# Patient Record
Sex: Female | Born: 1980 | Race: Black or African American | Hispanic: No | Marital: Married | Smoking: Never smoker
Health system: Southern US, Community
[De-identification: ages and names within clinical notes are randomized; demographics above are authoritative.]

## PROBLEM LIST (undated history)

## (undated) ENCOUNTER — Inpatient Hospital Stay (HOSPITAL_COMMUNITY): Payer: Self-pay

## (undated) DIAGNOSIS — E119 Type 2 diabetes mellitus without complications: Secondary | ICD-10-CM

## (undated) DIAGNOSIS — J302 Other seasonal allergic rhinitis: Secondary | ICD-10-CM

## (undated) HISTORY — DX: Other seasonal allergic rhinitis: J30.2

## (undated) HISTORY — DX: Type 2 diabetes mellitus without complications: E11.9

---

## 2013-12-19 ENCOUNTER — Ambulatory Visit (INDEPENDENT_AMBULATORY_CARE_PROVIDER_SITE_OTHER): Payer: Self-pay | Admitting: Emergency Medicine

## 2013-12-19 VITALS — BP 90/67 | HR 70 | Temp 98.7°F | Resp 18 | Wt 138.0 lb

## 2013-12-19 DIAGNOSIS — Z3200 Encounter for pregnancy test, result unknown: Secondary | ICD-10-CM

## 2013-12-19 DIAGNOSIS — R7309 Other abnormal glucose: Secondary | ICD-10-CM

## 2013-12-19 DIAGNOSIS — E119 Type 2 diabetes mellitus without complications: Secondary | ICD-10-CM

## 2013-12-19 LAB — POCT GLYCOSYLATED HEMOGLOBIN (HGB A1C): Hemoglobin A1C: 8.8

## 2013-12-19 LAB — POCT URINE PREGNANCY: PREG TEST UR: POSITIVE

## 2013-12-19 MED ORDER — FOLIC ACID 1 MG PO TABS: 3.0000 mg | ORAL_TABLET | Freq: Every day | ORAL | Status: DC

## 2013-12-19 MED ORDER — METFORMIN HCL ER 500 MG PO TB24: ORAL_TABLET | ORAL | Status: DC

## 2013-12-19 MED ORDER — PRENATAL VITAMINS PLUS 27-1 MG PO TABS
1.0000 | ORAL_TABLET | Freq: Every day | ORAL | Status: DC
Start: 2013-12-19 — End: 2014-01-23

## 2013-12-19 NOTE — Progress Notes (Signed)
[facility] [address], [place] [id number] [phone]  Date:  12/19/2013   Name:  Theresa Koch   DOB:  1980-02-18   MRN:  [id number]  PCP:  No primary care provider on file.    Chief Complaint: Diabetes and Possible Pregnancy   History of Present Illness:  Theresa Koch is a 33 y.o. very pleasant female patient who presents with the following:  Patient is visiting from [place] and is concerned as she has missed her last period.  She has a 721 month old daughter. She is a Type 2 diabetic currently on insulin as she lost control on metformin during her last trimester of pregnancy. Is concerned as her sugars have been elevated past several days.  She doesn't usually check her sugars and is concerned that should she be pregnant Elevated sugars may adversely affect the fetus Denies other complaint or health concern today.   There are no active problems to display for this patient.   Past Medical History  Diagnosis Date  . Diabetes mellitus without complication     Past Surgical History  Procedure Laterality Date  . Cesarean section      History  Substance Use Topics  . Smoking status: Never Smoker   . Smokeless tobacco: Not on file  . Alcohol Use: No    Family History  Problem Relation Age of Onset  . Diabetes Father   . Diabetes Sister   . Diabetes Brother     Not on File  Medication list has been reviewed and updated.  No current outpatient prescriptions on file prior to visit.   No current facility-administered medications on file prior to visit.    Review of Systems:  As per HPI, otherwise negative.    Physical Examination: Filed Vitals:   12/19/13 2004  BP: 90/67  Pulse: 70  Temp: 98.7 F (37.1 C)  Resp: 18   Filed Vitals:   12/19/13 2004  Weight: 138 lb (62.596 kg)   There is no height on file to calculate BMI. Ideal Body Weight:     GEN: WDWN, NAD, Non-toxic, Alert & Oriented x 3 HEENT: Atraumatic,  Normocephalic.  Ears and Nose: No external deformity. EXTR: No clubbing/cyanosis/edema NEURO: Normal gait.  PSYCH: Normally interactive. Conversant. Not depressed or anxious appearing.  Calm demeanor.    Assessment and Plan: NIDDM on insulin Pregnancy Metformin Folic acid  Signed,  Phillips OdorJeffery Merlinda Wrubel, MD   Results for orders placed or performed in visit on 12/19/13  POCT urine pregnancy  Result Value Ref Range   Preg Test, Ur Positive   POCT glycosylated hemoglobin (Hb A1C)  Result Value Ref Range   Hemoglobin A1C 8.8

## 2013-12-19 NOTE — Patient Instructions (Signed)
Type 1 or Type 2 Diabetes Mellitus During Pregnancy Diabetes mellitus, often simply referred to as diabetes, is a long-term (chronic) disease. Type 1 diabetes occurs when the islet cells, which are in the pancreas and make the hormone insulin, are destroyed and can no longer make insulin. Type 2 diabetes occurs when the pancreas does not make enough insulin, the cells are less responsive to the insulin that is made (insulin resistance), or both. Insulin is needed to move sugars from food into the tissue cells. The tissue cells use the sugars for energy. The lack of insulin or the lack of normal response to insulin causes excess sugars to build up in the blood instead of going into the tissue cells. As a result, high blood sugar (hyperglycemia) develops.  If blood glucose levels are kept in the normal range both before and during pregnancy, women can have a healthy pregnancy. If your blood glucose levels are not well controlled, there may be risks to you, your unborn baby, and your labor and delivery. Also, there may be risks to your baby once he or she is born.  RISK FACTORS  You are predisposed to developing type 1 diabetes if someone in your family has diabetes and you are exposed to certain environmental triggers.  You have an increased chance of developing type 2 diabetes if you have a family history of diabetes and also have one or more of the following risk factors:  Being overweight.  Having an inactive lifestyle.  Having a history of consistently eating high-calorie foods. SYMPTOMS  Increased thirst (polydipsia).  Increased urination (polyuria).  Increased urination during the night (nocturia).  Weight loss. This weight loss may be rapid.  Frequent, recurring infections.  Tiredness (fatigue).  Weakness.  Vision changes, such as blurred vision.  Fruity smell to your breath.  Abdominal pain.  Nausea or vomiting. DIAGNOSIS  Diabetes is diagnosed when blood glucose levels  are increased. Your blood glucose level may be checked by one or more of the following blood tests:  A fasting blood glucose test. You will not be allowed to eat for at least 8 hours before a blood sample is taken.  A random blood glucose test. Your blood glucose is checked at any time of the day regardless of when you ate.  A hemoglobin A1c blood glucose test. A hemoglobin A1c test provides information about blood glucose control over the previous 3 months.  An oral glucose tolerance test (OGTT). Your blood glucose is measured after you have not eaten (fasted) for 1-3 hours and then after you drink a glucose-containing beverage. An OGTT is usually performed during weeks 24-28 of your pregnancy. TREATMENT   You will need to take diabetes medicine or insulin daily to keep blood glucose levels in the desired range.  You will need to match insulin dosing with exercise and healthy food choices. The treatment goal is to maintain the before-meal (preprandial), bedtime, and overnight blood glucose level at 60-99 mg/dL during pregnancy. The treatment goal is to further maintain the peak after-meal blood sugar (postprandial glucose) level at 100-140 mg/dL.  HOME CARE INSTRUCTIONS   Have your hemoglobin A1c level checked twice a year.  Perform daily blood glucose monitoring as directed by your health care provider. It is common to perform frequent blood glucose monitoring.  Monitor urine ketones when you are sick and as directed by your health care provider.  Take your diabetes medicine and insulin as directed by your health care provider to maintain your blood glucose   level in the desired range.  Never run out of diabetes medicine or insulin. It is needed every day.  Adjust insulin based on your intake of carbohydrates. Carbohydrates can raise blood glucose levels but need to be included in your diet. Carbohydrates provide vitamins, minerals, and fiber, which are an essential part of a healthy  diet. Carbohydrates are found in fruits, vegetables, whole grains, dairy products, legumes, and foods containing added sugars.  Eat healthy foods. Alternate 3 meals with 3 snacks.  Maintain a healthy weight gain. The usual total expected weight gain varies according to your prepregnancy body mass index (BMI).  Carry a medical alert card or wear medical alert jewelry.  Carry a 15-gram carbohydrate snack with you at all times to treat low blood sugar (hypoglycemia). Some examples of 15-gram carbohydrate snacks include:  Glucose tablets, 3 or 4.  Glucose gel, 15-gram tube.  Raisins, 2 Tbsp (24 grams).  Jelly beans, 6.  Animal crackers, 8.  Fruit juice, regular soda, or low-fat milk, 4 ounces (120 mL).  Gummy treats, 9.  Recognize hypoglycemia. Hypoglycemia during pregnancy occurs with blood glucose levels of 60 mg/dL and below. The risk for hypoglycemia increases when fasting or skipping meals, during or after intense exercise, and during sleep. Hypoglycemia symptoms can include:  Tremors or shakes.  Decreased ability to concentrate.  Sweating.  Increased heart rate.  Headache.  Dry mouth.  Hunger.  Irritability.  Anxiety.  Restless sleep.  Altered speech or coordination.  Confusion.  Treat hypoglycemia promptly. If you are alert and able to safely swallow, follow the 15:15 rule:  Take 15-20 grams of rapid-acting glucose or carbohydrate. Rapid-acting options include glucose gel, glucose tablets, or 4 ounces (120 mL) of fruit juice, regular soda, or low-fat milk.  Check your blood glucose level 15 minutes after taking the glucose.  Take an additional 15-20 grams of glucose if the repeat blood glucose level is still 70 mg/dL or below.  Eat a meal or snack within 1 hour once blood glucose levels return to normal.  Engage in at least 30 minutes of physical activity a day or as directed by your health care provider. Ten minutes of physical activity timed 30  minutes after each meal is encouraged to control postprandial blood glucose levels.  Watch for polyuria (excess urination) and polydipsia (feeling extra thirsty), which are early signs of hyperglycemia. An early awareness of hyperglycemia allows for prompt treatment. Treat hyperglycemia as directed by your health care provider.  Adjust your insulin dosing and food intake, as needed, if you start a new exercise or sport.  Follow your sick-day plan any time you are unable to eat or drink as usual.  Avoid tobacco and alcohol use.  Keep all follow-up visits as directed by your health care provider.  Follow the advice of your health care provider regarding your prenatal and post-delivery (postpartum) appointments, meal planning, exercise, medicines, vitamins, blood tests, other medical tests, and physical activities.  Continue daily skin and foot care. Examine your skin and feet daily for cuts, bruises, redness, nail problems, bleeding, blisters, or sores. A foot exam by a health care provider should be done annually.  Brush your teeth and gums at least twice a day and floss at least once a day. Follow up with your dentist regularly.  Schedule an eye exam during the first trimester of your pregnancy or as directed by your health care provider.  Share your diabetes management plan with your workplace or school.  Stay up-to-date with immunizations.    Learn to manage stress.  Obtain ongoing diabetes education and support as needed.  Your health care provider may recommend that you take one low-dose aspirin (81 mg) each day to help prevent high blood pressure during your pregnancy (preeclampsia or eclampsia). You may be at risk for preeclampsia or eclampsia if:  You had preeclampsia or eclampsia during a previous pregnancy.  Your baby did not grow as expected during a previous pregnancy.  You experienced preterm birth with a previous pregnancy.  You experienced a separation of the placenta  from the uterus (placental abruption) during a previous pregnancy.  You experienced the loss of your baby during a previous pregnancy.  You are pregnant with more than one baby.  You have other medical conditions, such as high blood pressure or autoimmune disease. SEEK MEDICAL CARE IF:   You are unable to eat food or drink fluids for more than 6 hours.  You have nausea and vomiting for more than 6 hours.  You have a blood glucose level of 200 mg/dL and you have ketones in your urine.  There is a change in mental status.  You develop vision problems.  You have a persistent headache.  You have upper abdominal pain or discomfort.  You have an additional serious sickness.  You have diarrhea for more than 6 hours.  You have been sick or have had a fever for 2 days and are not getting better. SEEK IMMEDIATE MEDICAL CARE IF:  You have difficulty breathing.  You no longer feel your baby moving.  You are bleeding or have discharge from your vagina.  You start having premature contractions or labor. MAKE SURE YOU:  Understand these instructions.  Will watch your condition.  Will get help right away if you are not doing well or get worse. Document Released: 09/17/2011 Document Revised: 05/09/2013 Document Reviewed: 09/17/2011 ExitCare Patient Information 2015 [employer]. This information is not intended to replace advice given to you by your health care provider. Make sure you discuss any questions you have with your health care provider.  

## 2014-01-06 NOTE — L&D Delivery Note (Cosign Needed)
Delivery Note At 2:54 AM a viable female was delivered via Vaginal, Spontaneous Delivery (Presentation: ; Occiput Anterior).  APGAR: 9, 9; weight: pending.   Placenta status: Intact, Spontaneous.  Cord: 3 vessels     Anesthesia: Epidural  Episiotomy: None Lacerations: R labial & vaginal Suture Repair: 3.0 vicryl Est. Blood Loss (mL): 529  Mom to postpartum.  Baby to Couplet care / Skin to Skin.  Cam Hai CNM 08/09/2014, 3:59 AM

## 2014-01-12 ENCOUNTER — Encounter: Payer: Self-pay | Admitting: Internal Medicine

## 2014-01-12 ENCOUNTER — Ambulatory Visit (INDEPENDENT_AMBULATORY_CARE_PROVIDER_SITE_OTHER): Payer: 59 | Admitting: Internal Medicine

## 2014-01-12 VITALS — BP 100/62 | HR 91 | Temp 98.2°F | Resp 12 | Ht 65.0 in | Wt 135.0 lb

## 2014-01-12 DIAGNOSIS — O24911 Unspecified diabetes mellitus in pregnancy, first trimester: Secondary | ICD-10-CM

## 2014-01-12 MED ORDER — INSULIN PEN NEEDLE 32G X 4 MM MISC: Status: DC

## 2014-01-12 MED ORDER — METFORMIN HCL ER (OSM) 1000 MG PO TB24
1000.0000 mg | ORAL_TABLET | Freq: Every day | ORAL | Status: DC
Start: 2014-01-12 — End: 2014-02-14

## 2014-01-12 MED ORDER — INSULIN GLARGINE 100 UNIT/ML SOLOSTAR PEN: 8.0000 [IU] | PEN_INJECTOR | Freq: Every day | SUBCUTANEOUS | Status: DC

## 2014-01-12 MED ORDER — METFORMIN HCL ER 500 MG PO TB24: ORAL_TABLET | ORAL | Status: DC

## 2014-01-12 NOTE — Patient Instructions (Signed)
Please stop 70/30 insulin. Start Lantus 8 units at bedtime.  Please document sugars as discussed and call me in 1 week to let me know if they are controlled.  Goals of Glucose levels in pregnancy: Before meals <95 1h after a meal <140 2h after a meal <120  I referred you to ObGyn (Dr. Lynetta MareAnyawu).  Please return in 1 month with your sugar log.   PATIENT INSTRUCTIONS FOR TYPE 2 DIABETES:  **Please join MyChart!** - see attached instructions about how to join if you have not done so already.  DIET AND EXERCISE Diet and exercise is an important part of diabetic treatment.  We recommended aerobic exercise in the form of brisk walking (working between 40-60% of maximal aerobic capacity, similar to brisk walking) for 150 minutes per week (such as 30 minutes five days per week) along with 3 times per week performing 'resistance' training (using various gauge rubber tubes with handles) 5-10 exercises involving the major muscle groups (upper body, lower body and core) performing 10-15 repetitions (or near fatigue) each exercise. Start at half the above goal but build slowly to reach the above goals. If limited by weight, joint pain, or disability, we recommend daily walking in a swimming pool with water up to waist to reduce pressure from joints while allow for adequate exercise.    BLOOD GLUCOSES Monitoring your blood glucoses is important for continued management of your diabetes. Please check your blood glucoses 2-4 times a day: fasting, before meals and at bedtime (you can rotate these measurements - e.g. one day check before the 3 meals, the next day check before 2 of the meals and before bedtime, etc.).   HYPOGLYCEMIA (low blood sugar) Hypoglycemia is usually a reaction to not eating, exercising, or taking too much insulin/ other diabetes drugs.  Symptoms include tremors, sweating, hunger, confusion, headache, etc. Treat IMMEDIATELY with 15 grams of Carbs: . 4 glucose tablets .  cup regular  juice/soda . 2 tablespoons raisins . 4 teaspoons sugar . 1 tablespoon honey Recheck blood glucose in 15 mins and repeat above if still symptomatic/blood glucose <100.  RECOMMENDATIONS TO REDUCE YOUR RISK OF DIABETIC COMPLICATIONS: * Take your prescribed MEDICATION(S) * Follow a DIABETIC diet: Complex carbs, fiber rich foods, (monounsaturated and polyunsaturated) fats * AVOID saturated/trans fats, high fat foods, >2,300 mg salt per day. * EXERCISE at least 5 times a week for 30 minutes or preferably daily.  * DO NOT SMOKE OR DRINK more than 1 drink a day. * Check your FEET every day. Do not wear tightfitting shoes. Contact us if you develop an ulcer * See your EYE doctor once a year or more if needed * Get a FLU shot once a year * Get a PNEUMONIA vaccine once before and once after age 34 years  GOALS:  * Your Hemoglobin A1c of <7%  * fasting sugars need to be <130 * after meals sugars need to be <180 (2h after you start eating) * Your Systolic BP should be 140 or lower  * Your Diastolic BP should be 80 or lower  * Your HDL (Good Cholesterol) should be 40 or higher  * Your LDL (Bad Cholesterol) should be 100 or lower. * Your Triglycerides should be 150 or lower  * Your Urine microalbumin (kidney function) should be <30 * Your Body Mass Index should be 25 or lower

## 2014-01-12 NOTE — Progress Notes (Signed)
Patient ID: Theresa PerchesHoyam Finnie, female   DOB: 06-Apr-1980, 34 y.o.   MRN: [id number]  HPI: Theresa Koch is a 34 y.o.-year-old female, referred by Dr. Thornton PapasJeffrey Anderson, for management of DM2, dx in 2011, insulin-dependent since end 2013, uncontrolled, without complications. She is 2 mo pregnant. She also has a 3722 mo old daughter.  Last hemoglobin A1c was: Lab Results  Component Value Date   HGBA1C 8.8 12/19/2013   Pt is on a regimen of: - Metformin XR 1500 mg po after dinner - NPH-R 70/30 8 units in am  Pt checks her sugars 3x a day and they are: - am: 78-126 - 2h after b'fast: 46, 62 >> decreased 70/30 from 11 to 8 units >> 92, 94 - before lunch: n/c - 2h after lunch: 139-176 - before dinner: has lunch late - 2h after dinner: - bedtime: - nighttime: No lows. Lowest sugar was 46; she has hypoglycemia awareness at 60.  Highest sugar was 250, before starting Metformin.  Glucometer: One Touch Ultra  Pt's meals are: - Breakfast: tea with milk (nausea)  - Lunch: salad, soup, cheese - takes 70/30 before this - Dinner: egg, cheese, sometimes cereals - Snacks:   - no CKD, no BUN/creatinine available. - no h/o HL - last eye exam was in >6 mo. No DR.  - no numbness and tingling in her feet.  Pt has FH of DM - see below.  ROS: Constitutional: no weight gain/loss, no fatigue, no subjective hyperthermia/hypothermia, + nocturia Eyes: no blurry vision, no xerophthalmia ENT: no sore throat, no nodules palpated in throat, no dysphagia/odynophagia, no hoarseness Cardiovascular: no CP/SOB/palpitations/leg swelling Respiratory: no cough/SOB Gastrointestinal: +N/ no V/D/C Musculoskeletal: no muscle/joint aches Skin: no rashes Neurological: no tremors/numbness/tingling/dizziness, + HAs Psychiatric: no depression/anxiety  Past Medical History  Diagnosis Date  . Diabetes mellitus without complication    Past Surgical History  Procedure Laterality Date  . Cesarean section     History    Social History  . Marital Status: Married    Spouse Name: N/A    Number of Children: 1   Occupational History  . 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 Smoking status: Never Smoker   . Smokeless tobacco: Not on file  . Alcohol Use: No  . Drug Use: No   Current Outpatient Prescriptions on File Prior to Visit  Medication Sig Dispense Refill  . folic acid (FOLVITE) 1 MG tablet Take 3 tablets (3 mg total) by mouth daily. 100 tablet 12  . insulin NPH-regular Human (NOVOLIN 70/30) (70-30) 100 UNIT/ML injection Inject 15 Units into the skin.    . metFORMIN (GLUCOPHAGE XR) 500 MG 24 hr tablet 2tabs with evening meal for one week.  Then increase to 4 tabs daily 120 tablet 5  . Prenatal Vit-Fe Fumarate-FA (PRENATAL VITAMINS PLUS) 27-1 MG TABS Take 1 tablet by mouth daily. 30 tablet 12   No current facility-administered medications on file prior to visit.   No Known Allergies   Family History  Problem Relation Age of Onset  . Diabetes Father   . Diabetes Sister   . Diabetes Brother    PE: BP 100/62 mmHg  Pulse 91  Temp(Src) 98.2 F (36.8 C) (Oral)  Resp 12  Ht 5\' 5"  (1.651 m)  Wt 135 lb (61.236 kg)  BMI 22.47 kg/m2  SpO2 98%  LMP 11/06/2013 (Exact Date) Wt Readings from Last 3 Encounters:  01/12/14 135 lb (61.236 kg)  12/19/13  138 lb (62.596 kg)   Constitutional: normal weight, in NAD Eyes: PERRLA, EOMI, no exophthalmos ENT: moist mucous membranes, no thyromegaly, no cervical lymphadenopathy Cardiovascular: RRR, No MRG Respiratory: CTA B Gastrointestinal: abdomen soft, NT, ND, BS+ Musculoskeletal: no deformities, strength intact in all 4 Skin: moist, warm, no rashes Neurological: no tremor with outstretched hands, DTR normal in all 4  ASSESSMENT: 1. DM2, insulin-dependent, uncontrolled, without complications, during first trimester  PLAN:  1. Patient with several years h/o diabetes, now pregnant - We discussed about her  diabetes - appears to have DM2. We also discussed about options for treatment, and I suggested to:  Patient Instructions  Please stop 70/30 insulin. Start Lantus 8 units at bedtime.  Please document sugars as discussed and call me in 1 week to let me know if they are controlled.  Goals of Glucose levels in pregnancy: Before meals <95 1h after a meal <140 2h after a meal <120  I referred you to ObGyn (Dr. Lynetta Mare).  Please return in 1 month with your sugar log.  - discussed goals of CBGs in pregnancy: Before meals <95, 1h after a meal <140 2h after a meal <120 - discussed - Strongly advised her to start checking sugars at different times of the day - check 6 times a day, rotating checks - given sugar log and advised how to fill it and to bring it at next appt  - she was also advised to call me in ~ 1 week to see if we need mealtime insulin or Glyburide.to cover her postprandial sugars - we discussed about possible complications of hyperglycemia in pregnancy depending on the trimester - she is worried that her HbA1c was high: 8.8% - given foot care handout and explained the principles  - given instructions for hypoglycemia management "15-15 rule"  - advised for yearly eye exams  - referred [facility] to [facility] as she is not established with them yet - at next visit, will need to check a TSH, CMP, Lipids, ACR, if not seen ObGyn by then - Return to clinic in 1 mo with sugar log   - time spent with the patient: 1 hour, of which >50% was spent in obtaining information about her diabetes, reviewing her previous labs, evaluations, and treatments, counseling her about her condition (please see the discussed topics above), and developing a plan to further treat it. She had a number of questions which I addressed.

## 2014-01-16 ENCOUNTER — Telehealth: Payer: Self-pay | Admitting: Internal Medicine

## 2014-01-18 ENCOUNTER — Telehealth: Payer: Self-pay | Admitting: *Deleted

## 2014-01-18 DIAGNOSIS — O24911 Unspecified diabetes mellitus in pregnancy, first trimester: Secondary | ICD-10-CM

## 2014-01-18 NOTE — Telephone Encounter (Addendum)
[facility].  She is reportedly 2 months pregnant, has pre-existing diabetes and is in need of prenatal care. She has recently arrived from [place]. I scheduled US appt for 1/15 @ 1330. Pt will also receive clinic appt for 1/18 to begin prenatal care and meet with Diabetes Educator as her current prescribed insulin regimen is not standard of care for pregnancy.  I called pt and left message to call us back regarding appts scheduled. It is imperative that she has US this week and is seen by MD on 1/18. 0930  Pt's husband called back and pt was there with him while we spoke. I informed him of US appt on 1/15 and that a clinic appt would be made for 1/18. He informed Gracelyn and they both voiced agreement and understanding. They will be contacted with her clinic appt information.  He stated that a message can be left on the voice mail.

## 2014-01-19 NOTE — Telephone Encounter (Signed)
error 

## 2014-01-20 ENCOUNTER — Encounter: Payer: Self-pay | Admitting: Internal Medicine

## 2014-01-20 ENCOUNTER — Ambulatory Visit (HOSPITAL_COMMUNITY)
Admission: RE | Admit: 2014-01-20 | Discharge: 2014-01-20 | Disposition: A | Discharge status: Home / Self Care | Payer: 59 | Source: Ambulatory Visit | Attending: Obstetrics and Gynecology | Admitting: Obstetrics and Gynecology

## 2014-01-20 DIAGNOSIS — O24911 Unspecified diabetes mellitus in pregnancy, first trimester: Secondary | ICD-10-CM

## 2014-01-20 DIAGNOSIS — Z3A11 11 weeks gestation of pregnancy: Secondary | ICD-10-CM | POA: Insufficient documentation

## 2014-01-20 DIAGNOSIS — O26841 Uterine size-date discrepancy, first trimester: Secondary | ICD-10-CM | POA: Insufficient documentation

## 2014-01-23 ENCOUNTER — Ambulatory Visit (INDEPENDENT_AMBULATORY_CARE_PROVIDER_SITE_OTHER): Payer: 59 | Admitting: Obstetrics & Gynecology

## 2014-01-23 ENCOUNTER — Encounter: Payer: 59 | Attending: Obstetrics & Gynecology | Admitting: *Deleted

## 2014-01-23 ENCOUNTER — Other Ambulatory Visit: Payer: Self-pay | Admitting: *Deleted

## 2014-01-23 ENCOUNTER — Encounter: Payer: Self-pay | Admitting: Obstetrics & Gynecology

## 2014-01-23 VITALS — BP 102/63 | HR 79 | Temp 98.3°F | Ht 64.75 in | Wt 132.1 lb

## 2014-01-23 DIAGNOSIS — O24911 Unspecified diabetes mellitus in pregnancy, first trimester: Secondary | ICD-10-CM

## 2014-01-23 DIAGNOSIS — O34219 Maternal care for unspecified type scar from previous cesarean delivery: Secondary | ICD-10-CM | POA: Insufficient documentation

## 2014-01-23 DIAGNOSIS — Z23 Encounter for immunization: Secondary | ICD-10-CM

## 2014-01-23 DIAGNOSIS — Z713 Dietary counseling and surveillance: Secondary | ICD-10-CM | POA: Diagnosis not present

## 2014-01-23 DIAGNOSIS — O3421 Maternal care for scar from previous cesarean delivery: Secondary | ICD-10-CM

## 2014-01-23 LAB — COMPREHENSIVE METABOLIC PANEL
ALT: 11 U/L (ref 0–35)
AST: 16 U/L (ref 0–37)
Albumin: 4.1 g/dL (ref 3.5–5.2)
Alkaline Phosphatase: 34 U/L — ABNORMAL LOW (ref 39–117)
BUN: 8 mg/dL (ref 6–23)
CALCIUM: 9.3 mg/dL (ref 8.4–10.5)
CHLORIDE: 97 meq/L (ref 96–112)
CO2: 25 mEq/L (ref 19–32)
Creat: 0.44 mg/dL — ABNORMAL LOW (ref 0.50–1.10)
Glucose, Bld: 75 mg/dL (ref 70–99)
Potassium: 4.1 mEq/L (ref 3.5–5.3)
SODIUM: 132 meq/L — AB (ref 135–145)
Total Bilirubin: 0.3 mg/dL (ref 0.2–1.2)
Total Protein: 7.3 g/dL (ref 6.0–8.3)

## 2014-01-23 LAB — POCT URINALYSIS DIP (DEVICE)
Bilirubin Urine: NEGATIVE
GLUCOSE, UA: 100 mg/dL — AB
Hgb urine dipstick: NEGATIVE
KETONES UR: NEGATIVE mg/dL
Leukocytes, UA: NEGATIVE
Nitrite: NEGATIVE
Protein, ur: NEGATIVE mg/dL
Specific Gravity, Urine: 1.025 (ref 1.005–1.030)
UROBILINOGEN UA: 0.2 mg/dL (ref 0.0–1.0)
pH: 5 (ref 5.0–8.0)

## 2014-01-23 LAB — TSH: TSH: 1.056 u[IU]/mL (ref 0.350–4.500)

## 2014-01-23 MED ORDER — GLUCOSE BLOOD VI STRP: ORAL_STRIP | Status: DC

## 2014-01-23 MED ORDER — FOLIC ACID 1 MG PO TABS: 1.0000 mg | ORAL_TABLET | Freq: Every day | ORAL | Status: DC

## 2014-01-23 MED ORDER — PRENATAL VITAMINS PLUS 27-1 MG PO TABS: 1.0000 | ORAL_TABLET | Freq: Every day | ORAL | Status: DC

## 2014-01-23 MED ORDER — ONETOUCH DELICA LANCETS FINE MISC: Status: DC

## 2014-01-23 MED ORDER — ASPIRIN EC 81 MG PO TBEC: 81.0000 mg | DELAYED_RELEASE_TABLET | Freq: Every day | ORAL | Status: DC

## 2014-01-23 NOTE — Progress Notes (Signed)
MFM appt for NT scheduled for 1/27 Will call tomorrow or Wednesday for Fetal echo & opthalmology appts as those offices are closed

## 2014-01-23 NOTE — Progress Notes (Signed)
Here for first prenatal visit. Given new patient information. Discussed bmi/ appropriate weight gain.

## 2014-01-23 NOTE — Progress Notes (Signed)
Subjective:    Theresa Koch is a 34 y.o. G2P1001 at 7681w1d  By LMP consistent with 11 week ultrasound being seen today for her first obstetrical visit.  Her obstetrical history is significant for previous cesarean section because "insulin-dependent diabetes" as she was told by her MD in [place].  She has been seen by Dr. Elvera LennoxGherghe this pregnancy who placed her on Lantus and Metformin. The Lantus 8 units qhs causes her to have BS of 40-50s overnight associated with dizziness and shaking.  No other concerning symptoms;  patient reports nausea but able to tolerate food. Patient does intend to breast feed. Pregnancy history fully reviewed.    Filed Vitals:   01/23/14 0906 01/23/14 0910  BP: 102/63   Pulse: 79   Temp: 98.3 F (36.8 C)   Height:  5' 4.75" (1.645 m)  Weight: 132 lb 1.6 oz (59.92 kg)     HISTORY: OB History  Gravida Para Term Preterm AB SAB TAB Ectopic Multiple Living  2 1 1       1     # Outcome Date GA Lbr Len/2nd Weight Sex Delivery Anes PTL Lv  2 Current           1 Term 02/28/12 179w0d  5 lb 15.2 oz (2.7 kg) F  EPI N Y    Obstetric Comments  Had diabetes during pregnancy. Planned c/s at 38 weeks due to diabetes. Born in [place].   Past Medical History  Diagnosis Date  . Diabetes mellitus without complication    Past Surgical History  Procedure Laterality Date  . Cesarean section     Family History  Problem Relation Age of Onset  . Diabetes Father   . Kidney disease Father   . Diabetes Sister   . Diabetes Brother      Exam    Uterus:     Pelvic Exam:    Perineum: No Hemorrhoids, Normal Perineum   Vulva: normal   Vagina:  normal mucosa, normal discharge   Cervix: no cervical motion tenderness, no lesions and nulliparous appearance   Adnexa: normal adnexa and no mass, fullness, tenderness   Bony Pelvis: average  System: Breast:  normal appearance, no masses or tenderness   Skin: normal coloration and turgor, no rashes   Neurologic: oriented, normal   Extremities: normal strength, tone, and muscle mass, no deformities   HEENT PERRLA and extra ocular movement intact   Mouth/Teeth mucous membranes moist, pharynx normal without lesions and dental hygiene good   Neck supple and no masses   Cardiovascular: regular rate and rhythm   Respiratory:  appears well, vitals normal, no respiratory distress, acyanotic, normal RR, chest clear, no wheezing, crepitations, rhonchi, normal symmetric air entry   Abdomen: soft, non-tender; bowel sounds normal; no masses,  no organomegaly   Urinary: urethral meatus normal      Assessment:    Pregnancy: G2P1001 Patient Active Problem List   Diagnosis Date Noted  . Diabetes mellitus during pregnancy in first trimester, antepartum 01/12/2014    Plan:    DM Checkbox [x]  Baseline labs (CBC, CMP, urine Pr:Cr,TSH, HgA1C) [x]  Baby ASA prescribed after 12 weeks [ ]  Fetal ECHO - scheduled [ ]  Optho exam - scheduled [ ]  Serial growth scans 20-24-28-32-35-38 [ ]  Antenatal testing starting at 32 weeks [ ]  Delivery by 39 weeks or earlier if needed Discussed implications of DM in pregnancy, need for optimizing glycemic control to decrease DM associated maternal-fetal morbidity and mortality, need for antenatal testing and  frequent ultrasounds/prenatal visits. Will check baseline labs today, get DM education and DM testing supplies today. Initial prenatal labs drawn. Continue Prenatal vitamins and Aspirin. Flu shot given today. Problem list reviewed and updated. Genetic Screening discussed First Screen: ordered. Ultrasound discussed; fetal survey: to be ordered later. TOLAC form given to patient to review at home Follow up in 1 week.   Routine obstetric precautions reviewed.   Tereso Newcomer, MD 01/23/2014

## 2014-01-23 NOTE — Progress Notes (Signed)
Patient presents for review of glucose readings. Theresa Koch is T2DM and a patient of Dr.  Elvera LennoxGherghe. She was initially switch to Lantus 8units nightly. Due to FBS lows she decreased the Lantus last evening to 7units. However she did not test her FBS this AM so I am unable to evaluate the affects of the dose decrease. Patient understands the importance of testing and will monitor 6Xdaily. She will continue to communicate her readings to Dr. Elvera LennoxGherghe We reviewed dietary needs of patient with T2DM complicated by pregnancy. Encouraged 30 grams of carbs for breakfast, 45g for lunch and dinner. Three snacks per day at 15-20grams. I emphasized the need for bedtime snack. Theresa Koch is experiencing nausea and lack of appetite. She is also fearful of eating any foods containing sugar. Further education was provided about the need for carbohydrates. Food/meal examples were provided. I will f/u with patient as needed.

## 2014-01-23 NOTE — Patient Instructions (Signed)
Return to clinic for any obstetric concerns or go to [facility] for evaluation  

## 2014-01-24 LAB — OBSTETRIC PANEL
ANTIBODY SCREEN: NEGATIVE
BASOS PCT: 0 % (ref 0–1)
Basophils Absolute: 0 10*3/uL (ref 0.0–0.1)
Eosinophils Absolute: 0.1 10*3/uL (ref 0.0–0.7)
Eosinophils Relative: 2 % (ref 0–5)
HEMATOCRIT: 38.1 % (ref 36.0–46.0)
Hemoglobin: 12.8 g/dL (ref 12.0–15.0)
Hepatitis B Surface Ag: NEGATIVE
Lymphocytes Relative: 31 % (ref 12–46)
Lymphs Abs: 1.3 10*3/uL (ref 0.7–4.0)
MCH: 27.6 pg (ref 26.0–34.0)
MCHC: 33.6 g/dL (ref 30.0–36.0)
MCV: 82.1 fL (ref 78.0–100.0)
MONOS PCT: 8 % (ref 3–12)
MPV: 11.1 fL (ref 8.6–12.4)
Monocytes Absolute: 0.3 10*3/uL (ref 0.1–1.0)
Neutro Abs: 2.5 10*3/uL (ref 1.7–7.7)
Neutrophils Relative %: 59 % (ref 43–77)
Platelets: 174 10*3/uL (ref 150–400)
RBC: 4.64 MIL/uL (ref 3.87–5.11)
RDW: 15.6 % — AB (ref 11.5–15.5)
Rh Type: POSITIVE
Rubella: 9.84 Index — ABNORMAL HIGH (ref <0.90)
WBC: 4.2 10*3/uL (ref 4.0–10.5)

## 2014-01-24 LAB — PROTEIN / CREATININE RATIO, URINE
CREATININE, URINE: 92.8 mg/dL
Protein Creatinine Ratio: 0.11 (ref <0.15)
Total Protein, Urine: 10 mg/dL (ref 5–24)

## 2014-01-24 LAB — CULTURE, OB URINE
Colony Count: NO GROWTH
Organism ID, Bacteria: NO GROWTH

## 2014-01-25 ENCOUNTER — Telehealth: Payer: Self-pay | Admitting: General Practice

## 2014-01-25 LAB — HEMOGLOBINOPATHY EVALUATION
HGB A2 QUANT: 3 % (ref 2.2–3.2)
HGB S QUANTITAION: 0 %
Hemoglobin Other: 0 %
Hgb A: 97 % (ref 96.8–97.8)
Hgb F Quant: 0 % (ref 0.0–2.0)

## 2014-01-25 NOTE — Telephone Encounter (Signed)
[facility] appt [facility] Feb 15 @ 945, phone number [phone] address [address]. Made fetal echo appt with dr Elizebeth [facility]okingotton at [facility] 2/22 @ 9am phone [phone], address [address]. Called patient to inform of appts, no answer- left message we are calling to reach you in regards to some appts we made for you, please call us back at the clinics.

## 2014-01-26 ENCOUNTER — Other Ambulatory Visit: Payer: Self-pay | Admitting: *Deleted

## 2014-01-26 LAB — CYTOLOGY - PAP

## 2014-01-26 LAB — PRESCRIPTION MONITORING PROFILE (19 PANEL)
Amphetamine/Meth: NEGATIVE ng/mL
BUPRENORPHINE, URINE: NEGATIVE ng/mL
Barbiturate Screen, Urine: NEGATIVE ng/mL
Benzodiazepine Screen, Urine: NEGATIVE ng/mL
CREATININE, URINE: 92.91 mg/dL (ref >20.0)
Cannabinoid Scrn, Ur: NEGATIVE ng/mL
Carisoprodol, Urine: NEGATIVE ng/mL
Cocaine Metabolites: NEGATIVE ng/mL
ECSTASY: NEGATIVE ng/mL
Fentanyl, Ur: NEGATIVE ng/mL
MEPERIDINE UR: NEGATIVE ng/mL
Methadone Screen, Urine: NEGATIVE ng/mL
Methaqualone: NEGATIVE ng/mL
Nitrites, Initial: NEGATIVE ug/mL
OXYCODONE SCRN UR: NEGATIVE ng/mL
Opiate Screen, Urine: NEGATIVE ng/mL
Phencyclidine, Ur: NEGATIVE ng/mL
Propoxyphene: NEGATIVE ng/mL
Tapentadol, urine: NEGATIVE ng/mL
Tramadol Scrn, Ur: NEGATIVE ng/mL
Zolpidem, Urine: NEGATIVE ng/mL
pH, Initial: 5.3 pH (ref 4.5–8.9)

## 2014-01-26 MED ORDER — GLUCOSE BLOOD VI STRP: ORAL_STRIP | Status: DC

## 2014-01-26 NOTE — Telephone Encounter (Signed)
Note made on Mondays visit to inform patient of her appointment.

## 2014-01-30 ENCOUNTER — Ambulatory Visit (INDEPENDENT_AMBULATORY_CARE_PROVIDER_SITE_OTHER): Payer: 59 | Admitting: Obstetrics and Gynecology

## 2014-01-30 ENCOUNTER — Ambulatory Visit: Payer: 59

## 2014-01-30 VITALS — BP 109/69 | HR 82 | Temp 98.0°F | Wt 133.7 lb

## 2014-01-30 DIAGNOSIS — O24911 Unspecified diabetes mellitus in pregnancy, first trimester: Secondary | ICD-10-CM

## 2014-01-30 LAB — POCT URINALYSIS DIP (DEVICE)
Bilirubin Urine: NEGATIVE
GLUCOSE, UA: 500 mg/dL — AB
Hgb urine dipstick: NEGATIVE
Ketones, ur: NEGATIVE mg/dL
Leukocytes, UA: NEGATIVE
Nitrite: NEGATIVE
Protein, ur: NEGATIVE mg/dL
Specific Gravity, Urine: >=1.03 (ref 1.005–1.030)
Urobilinogen, UA: 0.2 mg/dL (ref 0.0–1.0)
pH: 6 (ref 5.0–8.0)

## 2014-01-30 NOTE — Progress Notes (Signed)
Pt with h/o diabetes, pt of Dr. Lafe GarinGherge.   #) Pre-existing DM -  - Echo and optho exam are scheduled  - Still taking metformin 500 mg TID - Taking 7 u of lantus qPM now given hypoglycemia with 8 u lantus Fastings: 54 - 66. No longer with feelings of hypoglycemia 2 hr pp:  - Bfast - 105, 77, 126, 150, 84, 72 - Lunch - 76, 126, 100, 127, 164 - Dinner - 185, 114, 167 7 of 14 > 120  Will have patient try 7 u lantus in AM with breakfast.   No VB, No LOF, no cramping, no contractions. + fm?   RTC 1 wk for re-eval sugars.

## 2014-01-30 NOTE — Progress Notes (Signed)
Informed patient of appts

## 2014-02-01 ENCOUNTER — Encounter (HOSPITAL_COMMUNITY): Payer: Self-pay

## 2014-02-01 ENCOUNTER — Ambulatory Visit (HOSPITAL_COMMUNITY)
Admission: RE | Admit: 2014-02-01 | Discharge: 2014-02-01 | Disposition: A | Discharge status: Home / Self Care | Payer: 59 | Source: Ambulatory Visit | Attending: Obstetrics & Gynecology | Admitting: Obstetrics & Gynecology

## 2014-02-01 DIAGNOSIS — Z794 Long term (current) use of insulin: Secondary | ICD-10-CM | POA: Insufficient documentation

## 2014-02-01 DIAGNOSIS — E119 Type 2 diabetes mellitus without complications: Secondary | ICD-10-CM | POA: Diagnosis not present

## 2014-02-01 DIAGNOSIS — O3421 Maternal care for scar from previous cesarean delivery: Secondary | ICD-10-CM | POA: Insufficient documentation

## 2014-02-01 DIAGNOSIS — O24111 Pre-existing diabetes mellitus, type 2, in pregnancy, first trimester: Secondary | ICD-10-CM | POA: Insufficient documentation

## 2014-02-01 DIAGNOSIS — Z3A12 12 weeks gestation of pregnancy: Secondary | ICD-10-CM | POA: Diagnosis not present

## 2014-02-01 DIAGNOSIS — Z36 Encounter for antenatal screening of mother: Secondary | ICD-10-CM | POA: Insufficient documentation

## 2014-02-01 DIAGNOSIS — Z369 Encounter for antenatal screening, unspecified: Secondary | ICD-10-CM | POA: Insufficient documentation

## 2014-02-01 DIAGNOSIS — O24911 Unspecified diabetes mellitus in pregnancy, first trimester: Secondary | ICD-10-CM

## 2014-02-01 LAB — SCREEN, FIRST TRIMESTER, SERUM

## 2014-02-02 ENCOUNTER — Encounter: Payer: Self-pay | Admitting: Internal Medicine

## 2014-02-06 ENCOUNTER — Ambulatory Visit (INDEPENDENT_AMBULATORY_CARE_PROVIDER_SITE_OTHER): Payer: 59 | Admitting: Family Medicine

## 2014-02-06 VITALS — BP 102/63 | HR 72 | Temp 98.2°F | Wt 131.7 lb

## 2014-02-06 DIAGNOSIS — O24911 Unspecified diabetes mellitus in pregnancy, first trimester: Secondary | ICD-10-CM

## 2014-02-06 DIAGNOSIS — O3421 Maternal care for scar from previous cesarean delivery: Secondary | ICD-10-CM

## 2014-02-06 DIAGNOSIS — O34219 Maternal care for unspecified type scar from previous cesarean delivery: Secondary | ICD-10-CM

## 2014-02-06 LAB — POCT URINALYSIS DIP (DEVICE)
Bilirubin Urine: NEGATIVE
Glucose, UA: NEGATIVE mg/dL
HGB URINE DIPSTICK: NEGATIVE
Ketones, ur: NEGATIVE mg/dL
Leukocytes, UA: NEGATIVE
NITRITE: NEGATIVE
PH: 7.5 (ref 5.0–8.0)
Protein, ur: NEGATIVE mg/dL
SPECIFIC GRAVITY, URINE: 1.02 (ref 1.005–1.030)
Urobilinogen, UA: 0.2 mg/dL (ref 0.0–1.0)

## 2014-02-06 NOTE — Progress Notes (Signed)
Fasting blood sugar has been low with some symptoms of hypoglycemia.  Taking 5 units of lantus at night.  Continue metformin.  3-4 elevated PP cbg with change (130s).  Changing diet - reducing carbohydrates.  Dr Lafe GarinGherge managing.  Has appt with him next week

## 2014-02-06 NOTE — Patient Instructions (Signed)
Second Trimester of Pregnancy The second trimester is from week 13 through week 28, months 4 through 6. The second trimester is often a time when you feel your best. Your body has also adjusted to being pregnant, and you begin to feel better physically. Usually, morning sickness has lessened or quit completely, you may have more energy, and you may have an increase in appetite. The second trimester is also a time when the fetus is growing rapidly. At the end of the sixth month, the fetus is about 9 inches long and weighs about 1 pounds. You will likely begin to feel the baby move (quickening) between 18 and 20 weeks of the pregnancy. BODY CHANGES Your body goes through many changes during pregnancy. The changes vary from woman to woman.   Your weight will continue to increase. You will notice your lower abdomen bulging out.  You may begin to get stretch marks on your hips, abdomen, and breasts.  You may develop headaches that can be relieved by medicines approved by your health care provider.  You may urinate more often because the fetus is pressing on your bladder.  You may develop or continue to have heartburn as a result of your pregnancy.  You may develop constipation because certain hormones are causing the muscles that push waste through your intestines to slow down.  You may develop hemorrhoids or swollen, bulging veins (varicose veins).  You may have back pain because of the weight gain and pregnancy hormones relaxing your joints between the bones in your pelvis and as a result of a shift in weight and the muscles that support your balance.  Your breasts will continue to grow and be tender.  Your gums may bleed and may be sensitive to brushing and flossing.  Dark spots or blotches (chloasma, mask of pregnancy) may develop on your face. This will likely fade after the baby is born.  A dark line from your belly button to the pubic area (linea nigra) may appear. This will likely fade  after the baby is born.  You may have changes in your hair. These can include thickening of your hair, rapid growth, and changes in texture. Some women also have hair loss during or after pregnancy, or hair that feels dry or thin. Your hair will most likely return to normal after your baby is born. WHAT TO EXPECT AT YOUR PRENATAL VISITS During a routine prenatal visit:  You will be weighed to make sure you and the fetus are growing normally.  Your blood pressure will be taken.  Your abdomen will be measured to track your baby's growth.  The fetal heartbeat will be listened to.  Any test results from the previous visit will be discussed. Your health care provider may ask you:  How you are feeling.  If you are feeling the baby move.  If you have had any abnormal symptoms, such as leaking fluid, bleeding, severe headaches, or abdominal cramping.  If you have any questions. Other tests that may be performed during your second trimester include:  Blood tests that check for:  Low iron levels (anemia).  Gestational diabetes (between 24 and 28 weeks).  Rh antibodies.  Urine tests to check for infections, diabetes, or protein in the urine.  An ultrasound to confirm the proper growth and development of the baby.  An amniocentesis to check for possible genetic problems.  Fetal screens for spina bifida and Down syndrome. HOME CARE INSTRUCTIONS   Avoid all smoking, herbs, alcohol, and unprescribed   drugs. These chemicals affect the formation and growth of the baby.  Follow your health care provider's instructions regarding medicine use. There are medicines that are either safe or unsafe to take during pregnancy.  Exercise only as directed by your health care provider. Experiencing uterine cramps is a good sign to stop exercising.  Continue to eat regular, healthy meals.  Wear a good support bra for breast tenderness.  Do not use hot tubs, steam rooms, or saunas.  Wear your  seat belt at all times when driving.  Avoid raw meat, uncooked cheese, cat litter boxes, and soil used by cats. These carry germs that can cause birth defects in the baby.  Take your prenatal vitamins.  Try taking a stool softener (if your health care provider approves) if you develop constipation. Eat more high-fiber foods, such as fresh vegetables or fruit and whole grains. Drink plenty of fluids to keep your urine clear or pale yellow.  Take warm sitz baths to soothe any pain or discomfort caused by hemorrhoids. Use hemorrhoid cream if your health care provider approves.  If you develop varicose veins, wear support hose. Elevate your feet for 15 minutes, 3-4 times a day. Limit salt in your diet.  Avoid heavy lifting, wear low heel shoes, and practice good posture.  Rest with your legs elevated if you have leg cramps or low back pain.  Visit your dentist if you have not gone yet during your pregnancy. Use a soft toothbrush to brush your teeth and be gentle when you floss.  A sexual relationship may be continued unless your health care provider directs you otherwise.  Continue to go to all your prenatal visits as directed by your health care provider. SEEK MEDICAL CARE IF:   You have dizziness.  You have mild pelvic cramps, pelvic pressure, or nagging pain in the abdominal area.  You have persistent nausea, vomiting, or diarrhea.  You have a bad smelling vaginal discharge.  You have pain with urination. SEEK IMMEDIATE MEDICAL CARE IF:   You have a fever.  You are leaking fluid from your vagina.  You have spotting or bleeding from your vagina.  You have severe abdominal cramping or pain.  You have rapid weight gain or loss.  You have shortness of breath with chest pain.  You notice sudden or extreme swelling of your face, hands, ankles, feet, or legs.  You have not felt your baby move in over an hour.  You have severe headaches that do not go away with  medicine.  You have vision changes. Document Released: 12/17/2000 Document Revised: 12/28/2012 Document Reviewed: 02/24/2012 ExitCare Patient Information 2015 [employer]. This information is not intended to replace advice given to you by your health care provider. Make sure you discuss any questions you have with your health care provider.  

## 2014-02-07 ENCOUNTER — Other Ambulatory Visit (HOSPITAL_COMMUNITY): Payer: Self-pay

## 2014-02-09 ENCOUNTER — Other Ambulatory Visit: Payer: Self-pay | Admitting: Obstetrics & Gynecology

## 2014-02-09 DIAGNOSIS — Z3689 Encounter for other specified antenatal screening: Secondary | ICD-10-CM

## 2014-02-09 DIAGNOSIS — O34219 Maternal care for unspecified type scar from previous cesarean delivery: Secondary | ICD-10-CM

## 2014-02-09 DIAGNOSIS — O24319 Unspecified pre-existing diabetes mellitus in pregnancy, unspecified trimester: Secondary | ICD-10-CM

## 2014-02-14 ENCOUNTER — Encounter: Payer: Self-pay | Admitting: Internal Medicine

## 2014-02-14 ENCOUNTER — Ambulatory Visit (INDEPENDENT_AMBULATORY_CARE_PROVIDER_SITE_OTHER): Payer: 59 | Admitting: Internal Medicine

## 2014-02-14 VITALS — BP 100/66 | HR 78 | Temp 98.3°F | Resp 12 | Wt 131.8 lb

## 2014-02-14 DIAGNOSIS — O24911 Unspecified diabetes mellitus in pregnancy, first trimester: Secondary | ICD-10-CM

## 2014-02-14 LAB — HEMOGLOBIN A1C: HEMOGLOBIN A1C: 7.6 % — AB (ref 4.6–6.5)

## 2014-02-14 MED ORDER — GLIPIZIDE ER 2.5 MG PO TB24: 2.5000 mg | ORAL_TABLET | Freq: Every day | ORAL | Status: DC

## 2014-02-14 MED ORDER — INSULIN GLARGINE 100 UNIT/ML SOLOSTAR PEN: 4.0000 [IU] | PEN_INJECTOR | Freq: Every day | SUBCUTANEOUS | Status: DC

## 2014-02-14 NOTE — Patient Instructions (Addendum)
Please return in 1 month with your sugar log.   Please stop at the lab.  Please continue: - Metformin XR 500 mg po after each meal  Decrease: - Lantus to 4 units at bedtime. Stop in 2-4 days after Glipizide if sugars consistently <70.  Start: - Glipizide XL 2.5 mg before breakfast. Please increase to 5 mg if sugars after meals not <120 and if you have no lows.

## 2014-02-14 NOTE — Progress Notes (Signed)
Patient ID: Theresa PerchesHoyam Herzberg, female   DOB: May 29, 1980, 34 y.o.   MRN: [id number]  HPI: Theresa Koch is a 34 y.o.-year-old female, referred by Dr. Thornton PapasJeffrey Anderson, for management of DM2, dx in 2011, insulin-dependent since end 2013, uncontrolled, without complications. She is 3 mo pregnant. She also has a 34 y/o daughter.  Last hemoglobin A1c was: Lab Results  Component Value Date   HGBA1C 8.8 12/19/2013   Pt was on a regimen of: - Metformin XR 1500 mg po after dinner - NPH-R 70/30 8 units in am  She is now on: - Lantus 6 units at bedtime - Metformin XR 500 mg po after each meal  Pt checks her sugars 3x a day and they are: - am: 78-126 >> 58-80 - 2h after b'fast: 46, 62 >> decreased 70/30 from 11 to 8 units >> 92, 94 >> 96-164, 184 - before lunch: n/c >> 53-116 - 2h after lunch: 139-176 >> 97-169, 221 - before dinner: has lunch late >> 72-89 - 2h after dinner: 134-179 - bedtime: n/c - nighttime: 56, 70-90 No lows. Lowest sugar was 46 >> 53; she has hypoglycemia awareness at 60.  Highest sugar was 221.  Glucometer: One Touch Ultra  Pt's meals are: - Breakfast: tea with milk (nausea)  - Lunch: salad, soup, cheese - takes 70/30 before this - Dinner: egg, cheese, sometimes cereals - Snacks:   - no CKD, no BUN/creatinine available. - no h/o HL - last eye exam was in >6 mo. No DR.  - no numbness and tingling in her feet.   ROS: Constitutional: no weight gain/loss, no fatigue, no subjective hyperthermia/hypothermia, + nocturia Eyes: no blurry vision, no xerophthalmia ENT: no sore throat, no nodules palpated in throat, no dysphagia/odynophagia, no hoarseness Cardiovascular: no CP/SOB/palpitations/leg swelling Respiratory: no cough/SOB Gastrointestinal: + N/ no V/D/C Musculoskeletal: no muscle/joint aches Skin: no rashes Neurological: no tremors/numbness/tingling/dizziness, + HAs  I reviewed pt's medications, allergies, PMH, social hx, family hx, and changes were documented in  the history of present illness. Otherwise, unchanged from my initial visit note:  Past Medical History  Diagnosis Date  . Diabetes mellitus without complication    Past Surgical History  Procedure Laterality Date  . Cesarean section     History   Social History  . Marital Status: Married    Spouse Name: N/A    Number of Children: 1   Occupational History  . 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 Smoking status: Never Smoker   . Smokeless tobacco: Not on file  . Alcohol Use: No  . Drug Use: No   Current Outpatient Prescriptions on File Prior to Visit  Medication Sig Dispense Refill  . folic acid (FOLVITE) 1 MG tablet Take 1 tablet (1 mg total) by mouth daily. 100 tablet 12  . glucose blood (ONE TOUCH ULTRA TEST) test strip Use to test blood sugar 6 times daily as instructed. 200 each 4  . Insulin Glargine (LANTUS SOLOSTAR) 100 UNIT/ML Solostar Pen Inject 8 Units into the skin daily at 10 pm. (Patient taking differently: Inject 6 Units into the skin daily at 10 pm. ) 5 pen 2  . Insulin Pen Needle (CAREFINE PEN NEEDLES) 32G X 4 MM MISC Use 1x a day 50 each 2  . metformin (FORTAMET) 1000 MG (OSM) 24 hr tablet Take 1 tablet (1,000 mg total) by mouth daily with breakfast. (Patient taking differently: Take 1,500 mg by mouth daily with breakfast. )  90 tablet 2  . ONETOUCH DELICA LANCETS FINE MISC Use to test blood sugar 6 times daily as instructed. 200 each 3  . Prenatal Vit-Fe Fumarate-FA (PRENATAL VITAMINS PLUS) 27-1 MG TABS Take 1 tablet by mouth daily. 30 tablet 12  . aspirin EC 81 MG tablet Take 1 tablet (81 mg total) by mouth daily. Take after 12 weeks for prevention of preeclampssia later in pregnancy (Patient not taking: Reported on 02/14/2014) 300 tablet 2   No current facility-administered medications on file prior to visit.   No Known Allergies   Family History  Problem Relation Age of Onset  . Diabetes Father   . Kidney disease  Father   . Diabetes Sister   . Diabetes Brother    PE: BP 100/66 mmHg  Pulse 78  Temp(Src) 98.3 F (36.8 C) (Oral)  Resp 12  Wt 131 lb 12.8 oz (59.784 kg)  SpO2 97%  LMP 11/06/2013 (Exact Date) Wt Readings from Last 3 Encounters:  02/14/14 131 lb 12.8 oz (59.784 kg)  02/06/14 131 lb 11.2 oz (59.739 kg)  01/30/14 133 lb 11.2 oz (60.646 kg)   Constitutional: normal weight, in NAD Eyes: PERRLA, EOMI, no exophthalmos ENT: moist mucous membranes, no thyromegaly, no cervical lymphadenopathy Cardiovascular: RRR, No MRG Respiratory: CTA B Gastrointestinal: abdomen soft, NT, ND, BS+ Musculoskeletal: no deformities, strength intact in all 4 Skin: moist, warm, no rashes Neurological: no tremor with outstretched hands, DTR normal in all 4  ASSESSMENT: 1. DM2, insulin-dependent, uncontrolled, without complications, during first trimester  PLAN:  1. Patient with several years h/o diabetes, now pregnant - now with low blood sugars but reticent to change regimen.  I suggested to:  Patient Instructions  Please return in 1 month with your sugar log.   Please stop at the lab.  Please continue: - Metformin XR 500 mg po after each meal  Decrease: - Lantus to 4 units at bedtime. Stop in 2-4 days after Glipizide if sugars consistently <70.  Start: - Glipizide XL 2.5 mg before breakfast. Please increase to 5 mg if sugars after meals not <120 and if you have no lows.  - discussed again goals of CBGs in pregnancy: Before meals <95, 1h after a meal <140 2h after a meal <120 - discussed - continue checking sugars at different times of the day - check 6 times a day, rotating checks - given more sugar logs  - will check HbA1c today - Return to clinic in 1 mo with sugar log   Office Visit on 02/14/2014  Component Date Value Ref Range Status  . Hgb A1c MFr Bld 02/14/2014 7.6* 4.6 - 6.5 % Final   Glycemic Control Guidelines for People with Diabetes:Non Diabetic:  <6%Goal of Therapy:  <7%Additional Action Suggested:  >8%    The hemoglobin A1c has improved.

## 2014-02-20 ENCOUNTER — Encounter: Payer: 59 | Admitting: Obstetrics and Gynecology

## 2014-02-27 ENCOUNTER — Encounter: Payer: Self-pay | Admitting: Obstetrics and Gynecology

## 2014-02-27 ENCOUNTER — Ambulatory Visit (INDEPENDENT_AMBULATORY_CARE_PROVIDER_SITE_OTHER): Payer: 59 | Admitting: Obstetrics and Gynecology

## 2014-02-27 VITALS — BP 104/58 | HR 79 | Temp 98.2°F | Wt 129.7 lb

## 2014-02-27 DIAGNOSIS — O3421 Maternal care for scar from previous cesarean delivery: Secondary | ICD-10-CM

## 2014-02-27 DIAGNOSIS — O34219 Maternal care for unspecified type scar from previous cesarean delivery: Secondary | ICD-10-CM

## 2014-02-27 DIAGNOSIS — O24911 Unspecified diabetes mellitus in pregnancy, first trimester: Secondary | ICD-10-CM

## 2014-02-27 LAB — POCT URINALYSIS DIP (DEVICE)
Bilirubin Urine: NEGATIVE
Glucose, UA: NEGATIVE mg/dL
Hgb urine dipstick: NEGATIVE
Ketones, ur: 15 mg/dL — AB
LEUKOCYTES UA: NEGATIVE
Nitrite: NEGATIVE
Protein, ur: NEGATIVE mg/dL
Specific Gravity, Urine: >=1.03 (ref 1.005–1.030)
Urobilinogen, UA: 0.2 mg/dL (ref 0.0–1.0)
pH: 5.5 (ref 5.0–8.0)

## 2014-02-27 NOTE — Progress Notes (Signed)
Patient is doing well without complaints. Anatomy ultrasound already scheduled. CBGs reviewed. Fasting all within range a few elevated values postprandial. Patient often skips meals in the hopes of bringing CBG down but realizes that it is higher than expected. Reviewed the importance of consuming 3 meals daily with higher protein content than carbs. Patient will see her endocrinologist next week.

## 2014-03-03 ENCOUNTER — Encounter: Payer: Self-pay | Admitting: Internal Medicine

## 2014-03-10 ENCOUNTER — Encounter: Payer: Self-pay | Admitting: *Deleted

## 2014-03-16 ENCOUNTER — Ambulatory Visit (HOSPITAL_COMMUNITY)
Admission: RE | Admit: 2014-03-16 | Discharge: 2014-03-16 | Disposition: A | Discharge status: Home / Self Care | Payer: Medicaid Other | Source: Ambulatory Visit | Attending: Obstetrics & Gynecology | Admitting: Obstetrics & Gynecology

## 2014-03-16 DIAGNOSIS — O34219 Maternal care for unspecified type scar from previous cesarean delivery: Secondary | ICD-10-CM

## 2014-03-16 DIAGNOSIS — O24112 Pre-existing diabetes mellitus, type 2, in pregnancy, second trimester: Secondary | ICD-10-CM | POA: Insufficient documentation

## 2014-03-16 DIAGNOSIS — Z36 Encounter for antenatal screening of mother: Secondary | ICD-10-CM | POA: Insufficient documentation

## 2014-03-16 DIAGNOSIS — E119 Type 2 diabetes mellitus without complications: Secondary | ICD-10-CM | POA: Insufficient documentation

## 2014-03-16 DIAGNOSIS — Z3A18 18 weeks gestation of pregnancy: Secondary | ICD-10-CM | POA: Insufficient documentation

## 2014-03-16 DIAGNOSIS — Z3689 Encounter for other specified antenatal screening: Secondary | ICD-10-CM

## 2014-03-16 DIAGNOSIS — Z794 Long term (current) use of insulin: Secondary | ICD-10-CM | POA: Insufficient documentation

## 2014-03-16 DIAGNOSIS — O24319 Unspecified pre-existing diabetes mellitus in pregnancy, unspecified trimester: Secondary | ICD-10-CM

## 2014-03-20 ENCOUNTER — Ambulatory Visit (INDEPENDENT_AMBULATORY_CARE_PROVIDER_SITE_OTHER): Payer: 59 | Admitting: Family Medicine

## 2014-03-20 ENCOUNTER — Encounter: Payer: Self-pay | Admitting: Family Medicine

## 2014-03-20 VITALS — BP 104/60 | HR 71 | Temp 98.2°F | Wt 130.9 lb

## 2014-03-20 DIAGNOSIS — O24319 Unspecified pre-existing diabetes mellitus in pregnancy, unspecified trimester: Secondary | ICD-10-CM

## 2014-03-20 DIAGNOSIS — O34219 Maternal care for unspecified type scar from previous cesarean delivery: Secondary | ICD-10-CM

## 2014-03-20 DIAGNOSIS — O3421 Maternal care for scar from previous cesarean delivery: Secondary | ICD-10-CM

## 2014-03-20 DIAGNOSIS — E119 Type 2 diabetes mellitus without complications: Secondary | ICD-10-CM

## 2014-03-20 LAB — POCT URINALYSIS DIP (DEVICE)
Bilirubin Urine: NEGATIVE
GLUCOSE, UA: NEGATIVE mg/dL
Hgb urine dipstick: NEGATIVE
KETONES UR: NEGATIVE mg/dL
Leukocytes, UA: NEGATIVE
Nitrite: NEGATIVE
Protein, ur: NEGATIVE mg/dL
SPECIFIC GRAVITY, URINE: 1.01 (ref 1.005–1.030)
Urobilinogen, UA: 0.2 mg/dL (ref 0.0–1.0)
pH: 7 (ref 5.0–8.0)

## 2014-03-20 LAB — HM DIABETES EYE EXAM

## 2014-03-20 NOTE — Progress Notes (Signed)
Fetal Echo 04/10/14 @ 930a with Dr. Elizebeth Brookingotton.

## 2014-03-20 NOTE — Progress Notes (Signed)
AFP and HIV today Desires TOLAC--information given No BS book given--seeing endocrinology Schedule fetal echo Take baby ASA HIV and AFP done

## 2014-03-20 NOTE — Progress Notes (Signed)
Needs HIV/AFP testing today

## 2014-03-20 NOTE — Patient Instructions (Addendum)
Gestational Diabetes Mellitus Gestational diabetes mellitus, often simply referred to as gestational diabetes, is a type of diabetes that some women develop during pregnancy. In gestational diabetes, the pancreas does not make enough insulin (a hormone), the cells are less responsive to the insulin that is made (insulin resistance), or both.Normally, insulin moves sugars from food into the tissue cells. The tissue cells use the sugars for energy. The lack of insulin or the lack of normal response to insulin causes excess sugars to build up in the blood instead of going into the tissue cells. As a result, high blood sugar (hyperglycemia) develops. The effect of high sugar (glucose) levels can cause many problems.  RISK FACTORS You have an increased chance of developing gestational diabetes if you have a family history of diabetes and also have one or more of the following risk factors:  A body mass index over 30 (obesity).  A previous pregnancy with gestational diabetes.  An older age at the time of pregnancy. If blood glucose levels are kept in the normal range during pregnancy, women can have a healthy pregnancy. If your blood glucose levels are not well controlled, there may be risks to you, your unborn baby (fetus), your labor and delivery, or your newborn baby.  SYMPTOMS  If symptoms are experienced, they are much like symptoms you would normally expect during pregnancy. The symptoms of gestational diabetes include:   Increased thirst (polydipsia).  Increased urination (polyuria).  Increased urination during the night (nocturia).  Weight loss. This weight loss may be rapid.  Frequent, recurring infections.  Tiredness (fatigue).  Weakness.  Vision changes, such as blurred vision.  Fruity smell to your breath.  Abdominal pain. DIAGNOSIS Diabetes is diagnosed when blood glucose levels are increased. Your blood glucose level may be checked by one or more of the following blood  tests:  A fasting blood glucose test. You will not be allowed to eat for at least 8 hours before a blood sample is taken.  A random blood glucose test. Your blood glucose is checked at any time of the day regardless of when you ate.  A hemoglobin A1c blood glucose test. A hemoglobin A1c test provides information about blood glucose control over the previous 3 months.  An oral glucose tolerance test (OGTT). Your blood glucose is measured after you have not eaten (fasted) for 1-3 hours and then after you drink a glucose-containing beverage. Since the hormones that cause insulin resistance are highest at about 24-28 weeks of a pregnancy, an OGTT is usually performed during that time. If you have risk factors for gestational diabetes, your health care provider may test you for gestational diabetes earlier than 24 weeks of pregnancy. TREATMENT   You will need to take diabetes medicine or insulin daily to keep blood glucose levels in the desired range.  You will need to match insulin dosing with exercise and healthy food choices. The treatment goal is to maintain the before-meal (preprandial), bedtime, and overnight blood glucose level at 60-99 mg/dL during pregnancy. The treatment goal is to further maintain peak after-meal blood sugar (postprandial glucose) level at 100-140 mg/dL. HOME CARE INSTRUCTIONS   Have your hemoglobin A1c level checked twice a year.  Perform daily blood glucose monitoring as directed by your health care provider. It is common to perform frequent blood glucose monitoring.  Monitor urine ketones when you are ill and as directed by your health care provider.  Take your diabetes medicine and insulin as directed by your health care provider   to maintain your blood glucose level in the desired range.  Never run out of diabetes medicine or insulin. It is needed every day.  Adjust insulin based on your intake of carbohydrates. Carbohydrates can raise blood glucose levels but  need to be included in your diet. Carbohydrates provide vitamins, minerals, and fiber which are an essential part of a healthy diet. Carbohydrates are found in fruits, vegetables, whole grains, dairy products, legumes, and foods containing added sugars.  Eat healthy foods. Alternate 3 meals with 3 snacks.  Maintain a healthy weight gain. The usual total expected weight gain varies according to your prepregnancy body mass index (BMI).  Carry a medical alert card or wear your medical alert jewelry.  Carry a 15-gram carbohydrate snack with you at all times to treat low blood glucose (hypoglycemia). Some examples of 15-gram carbohydrate snacks include:  Glucose tablets, 3 or 4.  Glucose gel, 15-gram tube.  Raisins, 2 tablespoons (24 g).  Jelly beans, 6.  Animal crackers, 8.  Fruit juice, regular soda, or low-fat milk, 4 ounces (120 mL).  Gummy treats, 9.  Recognize hypoglycemia. Hypoglycemia during pregnancy occurs with blood glucose levels of 60 mg/dL and below. The risk for hypoglycemia increases when fasting or skipping meals, during or after intense exercise, and during sleep. Hypoglycemia symptoms can include:  Tremors or shakes.  Decreased ability to concentrate.  Sweating.  Increased heart rate.  Headache.  Dry mouth.  Hunger.  Irritability.  Anxiety.  Restless sleep.  Altered speech or coordination.  Confusion.  Treat hypoglycemia promptly. If you are alert and able to safely swallow, follow the 15:15 rule:  Take 15-20 grams of rapid-acting glucose or carbohydrate. Rapid-acting options include glucose gel, glucose tablets, or 4 ounces (120 mL) of fruit juice, regular soda, or low-fat milk.  Check your blood glucose level 15 minutes after taking the glucose.  Take 15-20 grams more of glucose if the repeat blood glucose level is still 70 mg/dL or below.  Eat a meal or snack within 1 hour once blood glucose levels return to normal.  Be alert to polyuria  (excess urination) and polydipsia (excess thirst) which are early signs of hyperglycemia. An early awareness of hyperglycemia allows for prompt treatment. Treat hyperglycemia as directed by your health care provider.  Engage in at least 30 minutes of physical activity a day or as directed by your health care provider. Ten minutes of physical activity timed 30 minutes after each meal is encouraged to control postprandial blood glucose levels.  Adjust your insulin dosing and food intake as needed if you start a new exercise or sport.  Follow your sick-day plan at any time you are unable to eat or drink as usual.  Avoid tobacco and alcohol use.  Keep all follow-up visits as directed by your health care provider.  Follow the advice of your health care provider regarding your prenatal and post-delivery (postpartum) appointments, meal planning, exercise, medicines, vitamins, blood tests, other medical tests, and physical activities.  Perform daily skin and foot care. Examine your skin and feet daily for cuts, bruises, redness, nail problems, bleeding, blisters, or sores.  Brush your teeth and gums at least twice a day and floss at least once a day. Follow up with your dentist regularly.  Schedule an eye exam during the first trimester of your pregnancy or as directed by your health care provider.  Share your diabetes management plan with your workplace or school.  Stay up-to-date with immunizations.  Learn to manage stress.    Obtain ongoing diabetes education and support as needed.  Learn about and consider breastfeeding your baby.  You should have your blood sugar level checked 6-12 weeks after delivery. This is done with an oral glucose tolerance test (OGTT). SEEK MEDICAL CARE IF:   You are unable to eat food or drink fluids for more than 6 hours.  You have nausea and vomiting for more than 6 hours.  You have a blood glucose level of 200 mg/dL and you have ketones in your  urine.  There is a change in mental status.  You develop vision problems.  You have a persistent headache.  You have upper abdominal pain or discomfort.  You develop an additional serious illness.  You have diarrhea for more than 6 hours.  You have been sick or have had a fever for a couple of days and are not getting better. SEEK IMMEDIATE MEDICAL CARE IF:   You have difficulty breathing.  You no longer feel the baby moving.  You are bleeding or have discharge from your vagina.  You start having premature contractions or labor. MAKE SURE YOU:  Understand these instructions.  Will watch your condition.  Will get help right away if you are not doing well or get worse. Document Released: 03/31/2000 Document Revised: 05/09/2013 Document Reviewed: 07/22/2011 [employer] Patient Information 2015 [employer]. This information is not intended to replace advice given to you by your health care provider. Make sure you discuss any questions you have with your health care provider.  Second Trimester of Pregnancy The second trimester is from week 13 through week 28, months 4 through 6. The second trimester is often a time when you feel your best. Your body has also adjusted to being pregnant, and you begin to feel better physically. Usually, morning sickness has lessened or quit completely, you may have more energy, and you may have an increase in appetite. The second trimester is also a time when the fetus is growing rapidly. At the end of the sixth month, the fetus is about 9 inches long and weighs about 1 pounds. You will likely begin to feel the baby move (quickening) between 18 and 20 weeks of the pregnancy. BODY CHANGES Your body goes through many changes during pregnancy. The changes vary from woman to woman.   Your weight will continue to increase. You will notice your lower abdomen bulging out.  You may begin to get stretch marks on your hips, abdomen, and breasts.  You may  develop headaches that can be relieved by medicines approved by your health care provider.  You may urinate more often because the fetus is pressing on your bladder.  You may develop or continue to have heartburn as a result of your pregnancy.  You may develop constipation because certain hormones are causing the muscles that push waste through your intestines to slow down.  You may develop hemorrhoids or swollen, bulging veins (varicose veins).  You may have back pain because of the weight gain and pregnancy hormones relaxing your joints between the bones in your pelvis and as a result of a shift in weight and the muscles that support your balance.  Your breasts will continue to grow and be tender.  Your gums may bleed and may be sensitive to brushing and flossing.  Dark spots or blotches (chloasma, mask of pregnancy) may develop on your face. This will likely fade after the baby is born.  A dark line from your belly button to the pubic area (linea   nigra) may appear. This will likely fade after the baby is born.  You may have changes in your hair. These can include thickening of your hair, rapid growth, and changes in texture. Some women also have hair loss during or after pregnancy, or hair that feels dry or thin. Your hair will most likely return to normal after your baby is born. WHAT TO EXPECT AT YOUR PRENATAL VISITS During a routine prenatal visit:  You will be weighed to make sure you and the fetus are growing normally.  Your blood pressure will be taken.  Your abdomen will be measured to track your baby's growth.  The fetal heartbeat will be listened to.  Any test results from the previous visit will be discussed. Your health care provider may ask you:  How you are feeling.  If you are feeling the baby move.  If you have had any abnormal symptoms, such as leaking fluid, bleeding, severe headaches, or abdominal cramping.  If you have any questions. Other tests that  may be performed during your second trimester include:  Blood tests that check for:  Low iron levels (anemia).  Gestational diabetes (between 24 and 28 weeks).  Rh antibodies.  Urine tests to check for infections, diabetes, or protein in the urine.  An ultrasound to confirm the proper growth and development of the baby.  An amniocentesis to check for possible genetic problems.  Fetal screens for spina bifida and Down syndrome. HOME CARE INSTRUCTIONS   Avoid all smoking, herbs, alcohol, and unprescribed drugs. These chemicals affect the formation and growth of the baby.  Follow your health care provider's instructions regarding medicine use. There are medicines that are either safe or unsafe to take during pregnancy.  Exercise only as directed by your health care provider. Experiencing uterine cramps is a good sign to stop exercising.  Continue to eat regular, healthy meals.  Wear a good support bra for breast tenderness.  Do not use hot tubs, steam rooms, or saunas.  Wear your seat belt at all times when driving.  Avoid raw meat, uncooked cheese, cat litter boxes, and soil used by cats. These carry germs that can cause birth defects in the baby.  Take your prenatal vitamins.  Try taking a stool softener (if your health care provider approves) if you develop constipation. Eat more high-fiber foods, such as fresh vegetables or fruit and whole grains. Drink plenty of fluids to keep your urine clear or pale yellow.  Take warm sitz baths to soothe any pain or discomfort caused by hemorrhoids. Use hemorrhoid cream if your health care provider approves.  If you develop varicose veins, wear support hose. Elevate your feet for 15 minutes, 3-4 times a day. Limit salt in your diet.  Avoid heavy lifting, wear low heel shoes, and practice good posture.  Rest with your legs elevated if you have leg cramps or low back pain.  Visit your dentist if you have not gone yet during your  pregnancy. Use a soft toothbrush to brush your teeth and be gentle when you floss.  A sexual relationship may be continued unless your health care provider directs you otherwise.  Continue to go to all your prenatal visits as directed by your health care provider. SEEK MEDICAL CARE IF:   You have dizziness.  You have mild pelvic cramps, pelvic pressure, or nagging pain in the abdominal area.  You have persistent nausea, vomiting, or diarrhea.  You have a bad smelling vaginal discharge.  You have pain with   urination. SEEK IMMEDIATE MEDICAL CARE IF:   You have a fever.  You are leaking fluid from your vagina.  You have spotting or bleeding from your vagina.  You have severe abdominal cramping or pain.  You have rapid weight gain or loss.  You have shortness of breath with chest pain.  You notice sudden or extreme swelling of your face, hands, ankles, feet, or legs.  You have not felt your baby move in over an hour.  You have severe headaches that do not go away with medicine.  You have vision changes. Document Released: 12/17/2000 Document Revised: 12/28/2012 Document Reviewed: 02/24/2012 [employer] Patient Information 2015 [employer]. This information is not intended to replace advice given to you by your health care provider. Make sure you discuss any questions you have with your health care provider.  Breastfeeding Deciding to breastfeed is one of the best choices you can make for you and your baby. A change in hormones during pregnancy causes your breast tissue to grow and increases the number and size of your milk ducts. These hormones also allow proteins, sugars, and fats from your blood supply to make breast milk in your milk-producing glands. Hormones prevent breast milk from being released before your baby is born as well as prompt milk flow after birth. Once breastfeeding has begun, thoughts of your baby, as well as his or her sucking or crying, can stimulate the  release of milk from your milk-producing glands.  BENEFITS OF BREASTFEEDING For Your Baby  Your first milk (colostrum) helps your baby's digestive system function better.   There are antibodies in your milk that help your baby fight off infections.   Your baby has a lower incidence of asthma, allergies, and sudden infant death syndrome.   The nutrients in breast milk are better for your baby than infant formulas and are designed uniquely for your baby's needs.   Breast milk improves your baby's brain development.   Your baby is less likely to develop other conditions, such as childhood obesity, asthma, or type 2 diabetes mellitus.  For You   Breastfeeding helps to create a very special bond between you and your baby.   Breastfeeding is convenient. Breast milk is always available at the correct temperature and costs nothing.   Breastfeeding helps to burn calories and helps you lose the weight gained during pregnancy.   Breastfeeding makes your uterus contract to its prepregnancy size faster and slows bleeding (lochia) after you give birth.   Breastfeeding helps to lower your risk of developing type 2 diabetes mellitus, osteoporosis, and breast or ovarian cancer later in life. SIGNS THAT YOUR BABY IS HUNGRY Early Signs of Hunger  Increased alertness or activity.  Stretching.  Movement of the head from side to side.  Movement of the head and opening of the mouth when the corner of the mouth or cheek is stroked (rooting).  Increased sucking sounds, smacking lips, cooing, sighing, or squeaking.  Hand-to-mouth movements.  Increased sucking of fingers or hands. Late Signs of Hunger  Fussing.  Intermittent crying. Extreme Signs of Hunger Signs of extreme hunger will require calming and consoling before your baby will be able to breastfeed successfully. Do not wait for the following signs of extreme hunger to occur before you initiate breastfeeding:    Restlessness.  A loud, strong cry.   Screaming. BREASTFEEDING BASICS Breastfeeding Initiation  Find a comfortable place to sit or lie down, with your neck and back well supported.  Place a pillow or   rolled up blanket under your baby to bring him or her to the level of your breast (if you are seated). Nursing pillows are specially designed to help support your arms and your baby while you breastfeed.  Make sure that your baby's abdomen is facing your abdomen.   Gently massage your breast. With your fingertips, massage from your chest wall toward your nipple in a circular motion. This encourages milk flow. You may need to continue this action during the feeding if your milk flows slowly.  Support your breast with 4 fingers underneath and your thumb above your nipple. Make sure your fingers are well away from your nipple and your baby's mouth.   Stroke your baby's lips gently with your finger or nipple.   When your baby's mouth is open wide enough, quickly bring your baby to your breast, placing your entire nipple and as much of the colored area around your nipple (areola) as possible into your baby's mouth.   More areola should be visible above your baby's upper lip than below the lower lip.   Your baby's tongue should be between his or her lower gum and your breast.   Ensure that your baby's mouth is correctly positioned around your nipple (latched). Your baby's lips should create a seal on your breast and be turned out (everted).  It is common for your baby to suck about 2-3 minutes in order to start the flow of breast milk. Latching Teaching your baby how to latch on to your breast properly is very important. An improper latch can cause nipple pain and decreased milk supply for you and poor weight gain in your baby. Also, if your baby is not latched onto your nipple properly, he or she may swallow some air during feeding. This can make your baby fussy. Burping your baby when  you switch breasts during the feeding can help to get rid of the air. However, teaching your baby to latch on properly is still the best way to prevent fussiness from swallowing air while breastfeeding. Signs that your baby has successfully latched on to your nipple:    Silent tugging or silent sucking, without causing you pain.   Swallowing heard between every 3-4 sucks.    Muscle movement above and in front of his or her ears while sucking.  Signs that your baby has not successfully latched on to nipple:   Sucking sounds or smacking sounds from your baby while breastfeeding.  Nipple pain. If you think your baby has not latched on correctly, slip your finger into the corner of your baby's mouth to break the suction and place it between your baby's gums. Attempt breastfeeding initiation again. Signs of Successful Breastfeeding Signs from your baby:   A gradual decrease in the number of sucks or complete cessation of sucking.   Falling asleep.   Relaxation of his or her body.   Retention of a small amount of milk in his or her mouth.   Letting go of your breast by himself or herself. Signs from you:  Breasts that have increased in firmness, weight, and size 1-3 hours after feeding.   Breasts that are softer immediately after breastfeeding.  Increased milk volume, as well as a change in milk consistency and color by the fifth day of breastfeeding.   Nipples that are not sore, cracked, or bleeding. Signs That Your Baby is Getting Enough Milk  Wetting at least 3 diapers in a 24-hour period. The urine should be clear and pale   yellow by age 5 days.  At least 3 stools in a 24-hour period by age 5 days. The stool should be soft and yellow.  At least 3 stools in a 24-hour period by age 7 days. The stool should be seedy and yellow.  No loss of weight greater than 10% of birth weight during the first 3 days of age.  Average weight gain of 4-7 ounces (113-198 g) per week  after age 4 days.  Consistent daily weight gain by age 5 days, without weight loss after the age of 2 weeks. After a feeding, your baby may spit up a small amount. This is common. BREASTFEEDING FREQUENCY AND DURATION Frequent feeding will help you make more milk and can prevent sore nipples and breast engorgement. Breastfeed when you feel the need to reduce the fullness of your breasts or when your baby shows signs of hunger. This is called "breastfeeding on demand." Avoid introducing a pacifier to your baby while you are working to establish breastfeeding (the first 4-6 weeks after your baby is born). After this time you may choose to use a pacifier. Research has shown that pacifier use during the first year of a baby's life decreases the risk of sudden infant death syndrome (SIDS). Allow your baby to feed on each breast as long as he or she wants. Breastfeed until your baby is finished feeding. When your baby unlatches or falls asleep while feeding from the first breast, offer the second breast. Because newborns are often sleepy in the first few weeks of life, you may need to awaken your baby to get him or her to feed. Breastfeeding times will vary from baby to baby. However, the following rules can serve as a guide to help you ensure that your baby is properly fed:  Newborns (babies 4 weeks of age or younger) may breastfeed every 1-3 hours.  Newborns should not go longer than 3 hours during the day or 5 hours during the night without breastfeeding.  You should breastfeed your baby a minimum of 8 times in a 24-hour period until you begin to introduce solid foods to your baby at around 6 months of age. BREAST MILK PUMPING Pumping and storing breast milk allows you to ensure that your baby is exclusively fed your breast milk, even at times when you are unable to breastfeed. This is especially important if you are going back to work while you are still breastfeeding or when you are not able to be  present during feedings. Your lactation consultant can give you guidelines on how long it is safe to store breast milk.  A breast pump is a machine that allows you to pump milk from your breast into a sterile bottle. The pumped breast milk can then be stored in a refrigerator or freezer. Some breast pumps are operated by hand, while others use electricity. Ask your lactation consultant which type will work best for you. Breast pumps can be purchased, but some hospitals and breastfeeding support groups lease breast pumps on a monthly basis. A lactation consultant can teach you how to hand express breast milk, if you prefer not to use a pump.  CARING FOR YOUR BREASTS WHILE YOU BREASTFEED Nipples can become dry, cracked, and sore while breastfeeding. The following recommendations can help keep your breasts moisturized and healthy:  Avoid using soap on your nipples.   Wear a supportive bra. Although not required, special nursing bras and tank tops are designed to allow access to your breasts for breastfeeding   without taking off your entire bra or top. Avoid wearing underwire-style bras or extremely tight bras.  Air dry your nipples for 3-4minutes after each feeding.   Use only cotton bra pads to absorb leaked breast milk. Leaking of breast milk between feedings is normal.   Use lanolin on your nipples after breastfeeding. Lanolin helps to maintain your skin's normal moisture barrier. If you use pure lanolin, you do not need to wash it off before feeding your baby again. Pure lanolin is not toxic to your baby. You may also hand express a few drops of breast milk and gently massage that milk into your nipples and allow the milk to air dry. In the first few weeks after giving birth, some women experience extremely full breasts (engorgement). Engorgement can make your breasts feel heavy, warm, and tender to the touch. Engorgement peaks within 3-5 days after you give birth. The following recommendations can  help ease engorgement:  Completely empty your breasts while breastfeeding or pumping. You may want to start by applying warm, moist heat (in the shower or with warm water-soaked hand towels) just before feeding or pumping. This increases circulation and helps the milk flow. If your baby does not completely empty your breasts while breastfeeding, pump any extra milk after he or she is finished.  Wear a snug bra (nursing or regular) or tank top for 1-2 days to signal your body to slightly decrease milk production.  Apply ice packs to your breasts, unless this is too uncomfortable for you.  Make sure that your baby is latched on and positioned properly while breastfeeding. If engorgement persists after 48 hours of following these recommendations, contact your health care provider or a Science writer. OVERALL HEALTH CARE RECOMMENDATIONS WHILE BREASTFEEDING  Eat healthy foods. Alternate between meals and snacks, eating 3 of each per day. Because what you eat affects your breast milk, some of the foods may make your baby more irritable than usual. Avoid eating these foods if you are sure that they are negatively affecting your baby.  Drink milk, fruit juice, and water to satisfy your thirst (about 10 glasses a day).   Rest often, relax, and continue to take your prenatal vitamins to prevent fatigue, stress, and anemia.  Continue breast self-awareness checks.  Avoid chewing and smoking tobacco.  Avoid alcohol and drug use. Some medicines that may be harmful to your baby can pass through breast milk. It is important to ask your health care provider before taking any medicine, including all over-the-counter and prescription medicine as well as vitamin and herbal supplements. It is possible to become pregnant while breastfeeding. If birth control is desired, ask your health care provider about options that will be safe for your baby. SEEK MEDICAL CARE IF:   You feel like you want to stop  breastfeeding or have become frustrated with breastfeeding.  You have painful breasts or nipples.  Your nipples are cracked or bleeding.  Your breasts are red, tender, or warm.  You have a swollen area on either breast.  You have a fever or chills.  You have nausea or vomiting.  You have drainage other than breast milk from your nipples.  Your breasts do not become full before feedings by the fifth day after you give birth.  You feel sad and depressed.  Your baby is too sleepy to eat well.  Your baby is having trouble sleeping.   Your baby is wetting less than 3 diapers in a 24-hour period.  Your baby has  less than 3 stools in a 24-hour period.  Your baby's skin or the white part of his or her eyes becomes yellow.   Your baby is not gaining weight by 315 days of age. SEEK IMMEDIATE MEDICAL CARE IF:   Your baby is overly tired (lethargic) and does not want to wake up and feed.  Your baby develops an unexplained fever. Document Released: 12/23/2004 Document Revised: 12/28/2012 Document Reviewed: 06/16/2012 [facility] Patient Information 2015 [place], [place]. This information is not intended to replace advice given to you by your health care provider. Make sure you discuss any questions you have with your health care provider. Vaginal Birth After Cesarean Delivery Vaginal birth after cesarean delivery (VBAC) is giving birth vaginally after previously delivering a baby by a cesarean. In the past, if a woman had a cesarean delivery, all births afterward would be done by cesarean delivery. This is no longer true. It can be safe for the mother to try a vaginal delivery after having a cesarean delivery.  It is important to discuss VBAC with your health care provider early in the pregnancy so you can understand the risks, benefits, and options. It will give you time to decide what is best in your particular case. The final decision about whether to have a VBAC or repeat cesarean delivery  should be between you and your health care provider. Any changes in your health or your baby's health during your pregnancy may make it necessary to change your initial decision about VBAC.  WOMEN WHO PLAN TO HAVE A VBAC SHOULD CHECK WITH THEIR HEALTH CARE PROVIDER TO BE SURE THAT:  The previous cesarean delivery was done with a low transverse uterine cut (incision) (not a vertical classical incision).   The birth canal is big enough for the baby.   There were no other operations on the uterus.   An electronic fetal monitor (EFM) will be on at all times during labor.   An operating room will be available and ready in case an emergency cesarean delivery is needed.   A health care provider and surgical nursing staff will be available at all times during labor to be ready to do an emergency delivery cesarean if necessary.   An anesthesiologist will be present in case an emergency cesarean delivery is needed.   The nursery is prepared and has adequate personnel and necessary equipment available to care for the baby in case of an emergency cesarean delivery. BENEFITS OF VBAC  Shorter stay in the hospital.   Avoidance of risks associated with cesarean delivery, such as:  Surgical complications, such as opening of the incision or hernia in the incision.  Injury to other organs.  Fever. This can occur if an infection develops after surgery. It can also occur as a reaction to the medicine given to make you numb during the surgery.  Less blood loss and need for blood transfusions.  Lower risk of blood clots and infection.  Shorter recovery.   Decreased risk for having to remove the uterus (hysterectomy).   Decreased risk for the placenta to completely or partially cover the opening of the uterus (placenta previa) with a future pregnancy.   Decrease risk in future labor and delivery. RISKS OF A VBAC  Tearing (rupture) of the uterus. This is occurs in less than 1% of VBACs.  The risk of this happening is higher if:  Steps are taken to begin the labor process (induce labor) or stimulate or strengthen contractions (augment labor).   Medicine  is used to soften (ripen) the cervix.  Having to remove the uterus (hysterectomy) if it ruptures. VBAC SHOULD NOT BE DONE IF:  The previous cesarean delivery was done with a vertical (classical) or T-shaped incision or you do not know what kind of incision was made.   You had a ruptured uterus.   You have had certain types of surgery on your uterus, such as removal of uterine fibroids. Ask your health care provider about other types of surgeries that prevent you from having a VBAC.  You have certain medical or childbirth (obstetrical) problems.   There are problems with the baby.   You have had two previous cesarean deliveries and no vaginal deliveries. OTHER FACTS TO KNOW ABOUT VBAC:  It is safe to have an epidural anesthetic with VBAC.   It is safe to turn the baby from a breech position (attempt an external cephalic version).   It is safe to try a VBAC with twins.   VBAC may not be successful if your baby weights 8.8 lb (4 kg) or more. However, weight predictions are not always accurate and should not be used alone to decide if VBAC is right for you.  There is an increased failure rate if the time between the cesarean delivery and VBAC is less than 19 months.   Your health care provider may advise against a VBAC if you have preeclampsia (high blood pressure, protein in the urine, and swelling of face and extremities).   VBAC is often successful if you previously gave birth vaginally.   VBAC is often successful when the labor starts spontaneously before the due date.   Delivering a baby through a VBAC is similar to having a normal spontaneous vaginal delivery. Document Released: 06/15/2006 Document Revised: 05/09/2013 Document Reviewed: 07/22/2012 [facility] Patient Information 2015 [place], [place].  This information is not intended to replace advice given to you by your health care provider. Make sure you discuss any questions you have with your health care provider.

## 2014-03-21 LAB — HIV ANTIBODY (ROUTINE TESTING W REFLEX): HIV 1&2 Ab, 4th Generation: NONREACTIVE

## 2014-03-24 LAB — ALPHA FETOPROTEIN, MATERNAL
AFP: 51.3 ng/mL
Curr Gest Age: 19.1 wks.days
MoM for AFP: 0.83
Open Spina bifida: NEGATIVE
Osb Risk: 1:49100 {titer}

## 2014-03-30 ENCOUNTER — Encounter: Payer: Self-pay | Admitting: Internal Medicine

## 2014-03-30 ENCOUNTER — Ambulatory Visit (INDEPENDENT_AMBULATORY_CARE_PROVIDER_SITE_OTHER): Payer: Medicaid Other | Admitting: Internal Medicine

## 2014-03-30 VITALS — BP 100/62 | HR 68 | Temp 98.3°F | Resp 12 | Wt 131.0 lb

## 2014-03-30 DIAGNOSIS — O24912 Unspecified diabetes mellitus in pregnancy, second trimester: Secondary | ICD-10-CM | POA: Diagnosis not present

## 2014-03-30 NOTE — Progress Notes (Signed)
Patient ID: Theresa Koch, female   DOB: 06/02/1980, 34 y.o.   MRN: [id number]  HPI: Theresa Koch is a 34 y.o.-year-old female, referred by Dr. Thornton Papas, for management of DM2, dx in 2011, insulin-dependent since end 2013, uncontrolled, without complications. She is [redacted] weeks pregnant. She also has a 2 y/o daughter.  Last hemoglobin A1c was: Lab Results  Component Value Date   HGBA1C 7.6* 02/14/2014   HGBA1C 8.8 12/19/2013   Pt was on a regimen of: - Metformin XR 1500 mg po after dinner - NPH-R 70/30 8 units in am  She is now on: - Lantus 6 >> 4 >> 0 at bedtime!!! - Metformin XR 500 mg po after each meal (but only took 1000 mg in last 2 days) - Glipizide XL 2.5 mg before lunch  Pt checks her sugars 3x a day and they are slightly higher: - am: 78-126 >> 58-80 >> 59, 75-95, 113 - 2h after b'fast: 46, 62 >> decreased 70/30 from 11 to 8 units >> 92, 94 >> 96-164, 184 >> 78-142, 188, 217 - before lunch: n/c >> 53-116 >>  59, 66-113 - 2h after lunch: 139-176 >> 97-169, 221 >> 65, 86, 105-192 - before dinner: has lunch late >> 72-89 >> 76, 86-135, 153, 180 - 2h after dinner: 134-179 >> 93-159, 185 - bedtime: n/c - nighttime: 56, 70-90 >> 79, 91 No lows. Lowest sugar was 46 >> 53 >> 59; she has hypoglycemia awareness at 60.  Highest sugar was 221 >> 217x1  Glucometer: One Touch Ultra  Pt's meals are: - Breakfast: tea with milk (nausea)  - Lunch: salad, soup, cheese - takes 70/30 before this - Dinner: egg, cheese, sometimes cereals - Snacks:   - no CKD, no BUN/creatinine available. - no h/o HL - last eye exam was on 03/20/2014. No DR.  - no numbness and tingling in her feet.   ROS: Constitutional: no weight gain/loss, no fatigue, + subjective hyperthermia, + nocturia Eyes: no blurry vision, no xerophthalmia ENT: no sore throat, no nodules palpated in throat, no dysphagia/odynophagia, no hoarseness Cardiovascular: no CP/SOB/palpitations/leg swelling Respiratory: no  cough/SOB Gastrointestinal: no N/V/D/C Musculoskeletal: no muscle/joint aches Skin: no rashes Neurological: no tremors/numbness/tingling/dizziness, + HAs  I reviewed pt's medications, allergies, PMH, social hx, family hx, and changes were documented in the history of present illness. Otherwise, unchanged from my initial visit note:  Past Medical History  Diagnosis Date  . Diabetes mellitus without complication    Past Surgical History  Procedure Laterality Date  . Cesarean section     History   Social History  . Marital Status: Married    Spouse Name: N/A    Number of Children: 1   Occupational History  . 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 Smoking status: Never Smoker   . Smokeless tobacco: Not on file  . Alcohol Use: No  . Drug Use: No   Current Outpatient Prescriptions on File Prior to Visit  Medication Sig Dispense Refill  . aspirin EC 81 MG tablet Take 1 tablet (81 mg total) by mouth daily. Take after 12 weeks for prevention of preeclampssia later in pregnancy 300 tablet 2  . folic acid (FOLVITE) 1 MG tablet Take 1 tablet (1 mg total) by mouth daily. 100 tablet 12  . glipiZIDE (GLUCOTROL XL) 2.5 MG 24 hr tablet Take 1 tablet (2.5 mg total) by mouth daily with breakfast. 60 tablet 1  . glucose blood (  ONE TOUCH ULTRA TEST) test strip Use to test blood sugar 6 times daily as instructed. 200 each 4  . metFORMIN (GLUCOPHAGE) 500 MG tablet Take 1,500 mg by mouth 3 (three) times daily with meals.    Letta Pate. ONETOUCH DELICA LANCETS FINE MISC Use to test blood sugar 6 times daily as instructed. 200 each 3  . Prenatal Vit-Fe Fumarate-FA (PRENATAL VITAMINS PLUS) 27-1 MG TABS Take 1 tablet by mouth daily. 30 tablet 12  . Insulin Glargine (LANTUS SOLOSTAR) 100 UNIT/ML Solostar Pen Inject 4 Units into the skin daily at 10 pm. (Patient not taking: Reported on 02/27/2014) 5 pen 2  . Insulin Pen Needle (CAREFINE PEN NEEDLES) 32G X 4 MM MISC Use 1x a  day (Patient not taking: Reported on 03/20/2014) 50 each 2   No current facility-administered medications on file prior to visit.   No Known Allergies   Family History  Problem Relation Age of Onset  . Diabetes Father   . Kidney disease Father   . Diabetes Sister   . Diabetes Brother    PE: BP 100/62 mmHg  Pulse 68  Temp(Src) 98.3 F (36.8 C) (Oral)  Resp 12  Wt 131 lb (59.421 kg)  SpO2 99%  LMP 11/06/2013 (Exact Date) Wt Readings from Last 3 Encounters:  03/30/14 131 lb (59.421 kg)  03/20/14 130 lb 14.4 oz (59.376 kg)  02/27/14 129 lb 11.2 oz (58.832 kg)   Constitutional: normal weight, in NAD Eyes: PERRLA, EOMI, no exophthalmos ENT: moist mucous membranes, no thyromegaly, no cervical lymphadenopathy Cardiovascular: RRR, No MRG Respiratory: CTA B Gastrointestinal: abdomen soft, NT, ND, BS+ Musculoskeletal: no deformities, strength intact in all 4 Skin: moist, warm, no rashes Neurological: no tremor with outstretched hands, DTR normal in all 4  ASSESSMENT: 1. DM2, insulin-dependent, uncontrolled, without complications, during second trimester  PLAN:  1. Patient with several years h/o diabetes, now pregnant - less low blood sugars after we stopped insulin. She still has few lows and few highs. Will move Glipizide XL before b'fast as she is now taking it before lunch. Also, advised her to try to eat a little b'fast, now only tea and occasionally solid food. If sugars after dinner do not improve >> may add a regular glipizide 2.5 mg with that meal - but will let me know in 2 weeks  I suggested to:  Patient Instructions  Please return in 1.5 month with your sugar log.   Please stop at the lab.  Please continue: - Metformin XR 500 mg with each meal - Glipizide XL 2.5 mg but move it before breakfast   Please let me know in 2 weeks how your sugars are.  - goals of CBGs in pregnancy: Before meals <95, 1h after a meal <140 2h after a meal <120 - discussed - continue  checking sugars at different times of the day - check 6 times a day, rotating checks - no labs needed - Return to clinic in 1.5 mo with sugar log

## 2014-03-30 NOTE — Patient Instructions (Signed)
Please return in 1.5 month with your sugar log.   Please stop at the lab.  Please continue: - Metformin XR 500 mg with each meal - Glipizide XL 2.5 mg but move it before breakfast   Please let me know in 2 weeks how your sugars are.

## 2014-04-03 ENCOUNTER — Ambulatory Visit (INDEPENDENT_AMBULATORY_CARE_PROVIDER_SITE_OTHER): Payer: Medicaid Other | Admitting: Obstetrics & Gynecology

## 2014-04-03 VITALS — BP 107/50 | HR 80 | Temp 98.3°F | Wt 131.0 lb

## 2014-04-03 DIAGNOSIS — E119 Type 2 diabetes mellitus without complications: Secondary | ICD-10-CM

## 2014-04-03 DIAGNOSIS — O24319 Unspecified pre-existing diabetes mellitus in pregnancy, unspecified trimester: Secondary | ICD-10-CM

## 2014-04-03 LAB — POCT URINALYSIS DIP (DEVICE)
Bilirubin Urine: NEGATIVE
Glucose, UA: 500 mg/dL — AB
HGB URINE DIPSTICK: NEGATIVE
Leukocytes, UA: NEGATIVE
Nitrite: NEGATIVE
Protein, ur: NEGATIVE mg/dL
Specific Gravity, Urine: 1.02 (ref 1.005–1.030)
UROBILINOGEN UA: 0.2 mg/dL (ref 0.0–1.0)
pH: 7 (ref 5.0–8.0)

## 2014-04-03 NOTE — Progress Notes (Signed)
Endocrinologist managing BS - Dr. Alinda SierrasGheghe. Will follow along. On Glipizide and Metfromin. Follow up scan ordered. No other complaints or concerns.  Routine obstetric precautions reviewed.

## 2014-04-03 NOTE — Patient Instructions (Signed)
Return to clinic for any obstetric concerns or go to [facility] for evaluation  

## 2014-04-03 NOTE — Progress Notes (Signed)
Follow up U/S 04/13/14 @ 11a with [facility].

## 2014-04-13 ENCOUNTER — Encounter (HOSPITAL_COMMUNITY): Payer: Self-pay

## 2014-04-13 ENCOUNTER — Ambulatory Visit (HOSPITAL_COMMUNITY)
Admission: RE | Admit: 2014-04-13 | Discharge: 2014-04-13 | Disposition: A | Discharge status: Home / Self Care | Payer: Medicaid Other | Source: Ambulatory Visit | Attending: Obstetrics & Gynecology | Admitting: Obstetrics & Gynecology

## 2014-04-13 DIAGNOSIS — O24912 Unspecified diabetes mellitus in pregnancy, second trimester: Secondary | ICD-10-CM

## 2014-04-13 DIAGNOSIS — O24919 Unspecified diabetes mellitus in pregnancy, unspecified trimester: Secondary | ICD-10-CM | POA: Insufficient documentation

## 2014-04-13 DIAGNOSIS — O24319 Unspecified pre-existing diabetes mellitus in pregnancy, unspecified trimester: Secondary | ICD-10-CM | POA: Diagnosis not present

## 2014-04-13 DIAGNOSIS — E119 Type 2 diabetes mellitus without complications: Secondary | ICD-10-CM | POA: Diagnosis not present

## 2014-04-13 DIAGNOSIS — Z3A22 22 weeks gestation of pregnancy: Secondary | ICD-10-CM | POA: Insufficient documentation

## 2014-04-14 ENCOUNTER — Encounter (HOSPITAL_COMMUNITY): Payer: Self-pay | Admitting: Obstetrics & Gynecology

## 2014-04-17 ENCOUNTER — Ambulatory Visit (INDEPENDENT_AMBULATORY_CARE_PROVIDER_SITE_OTHER): Payer: Medicaid Other | Admitting: Obstetrics & Gynecology

## 2014-04-17 VITALS — BP 100/61 | HR 75 | Temp 98.3°F | Wt 129.7 lb

## 2014-04-17 DIAGNOSIS — O34219 Maternal care for unspecified type scar from previous cesarean delivery: Secondary | ICD-10-CM

## 2014-04-17 DIAGNOSIS — O3421 Maternal care for scar from previous cesarean delivery: Secondary | ICD-10-CM

## 2014-04-17 DIAGNOSIS — O24319 Unspecified pre-existing diabetes mellitus in pregnancy, unspecified trimester: Secondary | ICD-10-CM

## 2014-04-17 DIAGNOSIS — E119 Type 2 diabetes mellitus without complications: Secondary | ICD-10-CM

## 2014-04-17 LAB — POCT URINALYSIS DIP (DEVICE)
Bilirubin Urine: NEGATIVE
Glucose, UA: 250 mg/dL — AB
Hgb urine dipstick: NEGATIVE
Leukocytes, UA: NEGATIVE
NITRITE: NEGATIVE
PH: 7 (ref 5.0–8.0)
PROTEIN: NEGATIVE mg/dL
Specific Gravity, Urine: 1.015 (ref 1.005–1.030)
UROBILINOGEN UA: 0.2 mg/dL (ref 0.0–1.0)

## 2014-04-17 NOTE — Progress Notes (Signed)
Pt complains of achy pain at umbilicus since last week, worse last 3 days - on exam, some rectus diathesis noted due to enlarging uterus. Patient reassured. No hernia palpated yet. Endocrinologist managing BS - Dr. Elvera LennoxGherghe. Will follow along. On Glipizide and Metformin. Normal  EFW of 70% on 04/13/14.  Patient not gaining weight, declines Nutrition referral,reports she is eating well. Will observe for now. TOLAC form signed today. No other complaints or concerns. Routine obstetric precautions reviewed.

## 2014-04-18 ENCOUNTER — Encounter: Payer: Self-pay | Admitting: Internal Medicine

## 2014-04-20 ENCOUNTER — Encounter: Payer: Self-pay | Admitting: Internal Medicine

## 2014-04-20 MED ORDER — GLUCOSE BLOOD VI STRP: ORAL_STRIP | Status: DC

## 2014-04-24 ENCOUNTER — Other Ambulatory Visit: Payer: Self-pay | Admitting: *Deleted

## 2014-04-24 MED ORDER — ACCU-CHEK AVIVA PLUS W/DEVICE KIT: PACK | Status: DC

## 2014-04-24 MED ORDER — GLUCOSE BLOOD VI STRP: ORAL_STRIP | Status: DC

## 2014-04-24 MED ORDER — ACCU-CHEK SOFTCLIX LANCETS MISC: Status: DC

## 2014-04-24 NOTE — Telephone Encounter (Signed)
Medicaid will not cover pt's test strips. They are requiring a PA. Pt is on Medicaid. [facility] requires Accu-Chek meters, strips and lancets. Sending rx to pt's pharmacy.

## 2014-05-01 ENCOUNTER — Encounter: Payer: Self-pay | Admitting: Obstetrics and Gynecology

## 2014-05-01 ENCOUNTER — Ambulatory Visit (INDEPENDENT_AMBULATORY_CARE_PROVIDER_SITE_OTHER): Payer: Medicaid Other | Admitting: Obstetrics and Gynecology

## 2014-05-01 VITALS — BP 103/58 | HR 76 | Temp 98.3°F | Wt 130.8 lb

## 2014-05-01 DIAGNOSIS — E119 Type 2 diabetes mellitus without complications: Secondary | ICD-10-CM

## 2014-05-01 DIAGNOSIS — O24319 Unspecified pre-existing diabetes mellitus in pregnancy, unspecified trimester: Secondary | ICD-10-CM

## 2014-05-01 DIAGNOSIS — O34219 Maternal care for unspecified type scar from previous cesarean delivery: Secondary | ICD-10-CM

## 2014-05-01 DIAGNOSIS — O3421 Maternal care for scar from previous cesarean delivery: Secondary | ICD-10-CM

## 2014-05-01 LAB — POCT URINALYSIS DIP (DEVICE)
Bilirubin Urine: NEGATIVE
Glucose, UA: NEGATIVE mg/dL
Hgb urine dipstick: NEGATIVE
KETONES UR: 40 mg/dL — AB
Leukocytes, UA: NEGATIVE
Nitrite: NEGATIVE
PH: 7 (ref 5.0–8.0)
Protein, ur: NEGATIVE mg/dL
SPECIFIC GRAVITY, URINE: 1.01 (ref 1.005–1.030)
Urobilinogen, UA: 0.2 mg/dL (ref 0.0–1.0)

## 2014-05-01 NOTE — Progress Notes (Signed)
Ketones in urine

## 2014-05-01 NOTE — Progress Notes (Signed)
Patient is doing well without complaints. FM/PTL precautions reviewed CBGs reviewed- fasting within range, 2 hour pp most within range with a 3 abnormal 128, 160, 149 Patient admits to overeating at times and not adhering to the diet. She is scheduled to see her endocrinologist on 5/5 Follow up ultrasound on 5/5

## 2014-05-11 ENCOUNTER — Ambulatory Visit: Payer: 59 | Admitting: Internal Medicine

## 2014-05-11 ENCOUNTER — Other Ambulatory Visit (HOSPITAL_COMMUNITY): Payer: Self-pay | Admitting: Maternal and Fetal Medicine

## 2014-05-11 ENCOUNTER — Encounter (HOSPITAL_COMMUNITY): Payer: Self-pay

## 2014-05-11 ENCOUNTER — Ambulatory Visit (HOSPITAL_COMMUNITY)
Admission: RE | Admit: 2014-05-11 | Discharge: 2014-05-11 | Disposition: A | Discharge status: Home / Self Care | Payer: Medicaid Other | Source: Ambulatory Visit | Attending: Obstetrics & Gynecology | Admitting: Obstetrics & Gynecology

## 2014-05-11 VITALS — BP 97/61 | HR 73 | Wt 132.5 lb

## 2014-05-11 DIAGNOSIS — Z3A26 26 weeks gestation of pregnancy: Secondary | ICD-10-CM | POA: Insufficient documentation

## 2014-05-11 DIAGNOSIS — O3421 Maternal care for scar from previous cesarean delivery: Secondary | ICD-10-CM | POA: Diagnosis not present

## 2014-05-11 DIAGNOSIS — Z98891 History of uterine scar from previous surgery: Secondary | ICD-10-CM

## 2014-05-11 DIAGNOSIS — E119 Type 2 diabetes mellitus without complications: Secondary | ICD-10-CM | POA: Insufficient documentation

## 2014-05-11 DIAGNOSIS — O24912 Unspecified diabetes mellitus in pregnancy, second trimester: Secondary | ICD-10-CM

## 2014-05-11 DIAGNOSIS — O24112 Pre-existing diabetes mellitus, type 2, in pregnancy, second trimester: Secondary | ICD-10-CM | POA: Diagnosis not present

## 2014-05-11 DIAGNOSIS — O24313 Unspecified pre-existing diabetes mellitus in pregnancy, third trimester: Secondary | ICD-10-CM

## 2014-05-12 ENCOUNTER — Encounter: Payer: Self-pay | Admitting: Internal Medicine

## 2014-05-12 ENCOUNTER — Ambulatory Visit (INDEPENDENT_AMBULATORY_CARE_PROVIDER_SITE_OTHER): Payer: Medicaid Other | Admitting: Internal Medicine

## 2014-05-12 VITALS — BP 110/68 | HR 77 | Temp 97.8°F | Resp 12 | Wt 132.8 lb

## 2014-05-12 DIAGNOSIS — E119 Type 2 diabetes mellitus without complications: Secondary | ICD-10-CM | POA: Diagnosis not present

## 2014-05-12 DIAGNOSIS — O24319 Unspecified pre-existing diabetes mellitus in pregnancy, unspecified trimester: Secondary | ICD-10-CM

## 2014-05-12 LAB — HEMOGLOBIN A1C: HEMOGLOBIN A1C: 6.2 % (ref 4.6–6.5)

## 2014-05-12 MED ORDER — GLIPIZIDE 5 MG PO TABS: 2.5000 mg | ORAL_TABLET | Freq: Two times a day (BID) | ORAL | Status: DC

## 2014-05-12 MED ORDER — METFORMIN HCL 500 MG PO TABS: 500.0000 mg | ORAL_TABLET | Freq: Three times a day (TID) | ORAL | Status: DC

## 2014-05-12 NOTE — Progress Notes (Signed)
Patient ID: Theresa Koch, female   DOB: 1980/10/17, 34 y.o.   MRN: [id number]  HPI: Theresa Koch is a 34 y.o.-year-old female, initially referred by Dr. Synetta Shadow, returning for f/u for DM2, dx in 2011, insulin-dependent since end 2013, uncontrolled, without complications. She is [redacted] weeks pregnant (will have a girl). She also has a 2 y/o daughter. Last visit 1.5 mo ago.  Last hemoglobin A1c was: Lab Results  Component Value Date   HGBA1C 7.6* 02/14/2014   HGBA1C 8.8 12/19/2013   Pt was on a regimen of: - Metformin XR 1500 mg po after dinner - NPH-R 70/30 8 units in am  She is now on: - Lantus 6 >> 4 >> 0 at bedtime!!! - Metformin XR 500 mg po after each meal (but only took 1000 mg in last 2 days) - Glipizide XL 2.5 mg before b'fast  Pt checks her sugars 3x a day and they are: - am: 78-126 >> 58-80 >> 59, 75-95, 113 >> 73-117, 128 - 2h after b'fast: 92, 94 >> 96-164, 184 >> 78-142, 188, 217 >> 72-157, 209 - before lunch: n/c >> 53-116 >>  59, 66-113 >> 64-102 - 2h after lunch: 139-176 >> 97-169, 221 >> 65, 86, 105-192 >> 84-134 - before dinner: has lunch late >> 72-89 >> 76, 86-135, 153, 180 >> 78-139, 163 - 2h after dinner: 134-179 >> 93-159, 185 >> 109-167, 182 - bedtime: n/c >> 148 - nighttime: 56, 70-90 >> 79, 91 >> n/c No lows. Lowest sugar was 46 >> 53 >> 59 >> 64; she has hypoglycemia awareness at 60.  Highest sugar was 221 >> 217x1 >> 209  Glucometer: One Touch Ultra  Pt's meals are: - Breakfast: tea with milk (nausea)  - Lunch: salad, soup, cheese - takes 70/30 before this - Dinner: egg, cheese, sometimes cereals - Snacks:   - no CKD, no BUN/creatinine: Lab Results  Component Value Date   BUN 8 01/23/2014   Lab Results  Component Value Date   CREATININE 0.44* 01/23/2014   - no h/o HL No results found for: CHOL, HDL, LDLCALC, LDLDIRECT, TRIG, CHOLHDL  - last eye exam was on 03/20/2014. No DR.  - no numbness and tingling in her  feet.   ROS: Constitutional: no weight gain/loss, no fatigue, no subjective hyperthermia, no nocturia  Eyes: no blurry vision, no xerophthalmia ENT: no sore throat, no nodules palpated in throat, no dysphagia/odynophagia, no hoarseness Cardiovascular: no CP/SOB/palpitations/leg swelling Respiratory: no cough/SOB Gastrointestinal: no N/V/D/C Musculoskeletal: no muscle/joint aches Skin: no rashes Neurological: no tremors/numbness/tingling/dizziness  I reviewed pt's medications, allergies, PMH, social hx, family hx, and changes were documented in the history of present illness. Otherwise, unchanged from my initial visit note:  Past Medical History  Diagnosis Date  . Diabetes mellitus without complication    Past Surgical History  Procedure Laterality Date  . Cesarean section     History   Social History  . Marital Status: Married    Spouse Name: N/A    Number of Children: 1   Occupational History  . 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 Smoking status: Never Smoker   . Smokeless tobacco: Not on file  . Alcohol Use: No  . Drug Use: No   Current Outpatient Prescriptions on File Prior to Visit  Medication Sig Dispense Refill  . ACCU-CHEK SOFTCLIX LANCETS lancets Use to test blood sugar 6 times daily as instructed. Dx code: H29.924 200 each  3  . aspirin EC 81 MG tablet Take 1 tablet (81 mg total) by mouth daily. Take after 12 weeks for prevention of preeclampssia later in pregnancy 300 tablet 2  . Blood Glucose Monitoring Suppl (ACCU-CHEK AVIVA PLUS) W/DEVICE KIT Use to test blood sugar 6 times daily as instructed. Dx code: X52.841 1 kit 0  . folic acid (FOLVITE) 1 MG tablet Take 1 tablet (1 mg total) by mouth daily. 100 tablet 12  . glipiZIDE (GLUCOTROL XL) 2.5 MG 24 hr tablet Take 1 tablet (2.5 mg total) by mouth daily with breakfast. 60 tablet 1  . glucose blood (ACCU-CHEK AVIVA PLUS) test strip Use to test blood sugar 6 times daily  as instructed. Dx code: L24.401 200 each 3  . Insulin Glargine (LANTUS SOLOSTAR) 100 UNIT/ML Solostar Pen Inject 4 Units into the skin daily at 10 pm. 5 pen 2  . Insulin Pen Needle (CAREFINE PEN NEEDLES) 32G X 4 MM MISC Use 1x a day 50 each 2  . metFORMIN (GLUCOPHAGE) 500 MG tablet Take 1,500 mg by mouth 3 (three) times daily with meals.    Glory Rosebush DELICA LANCETS FINE MISC Use to test blood sugar 6 times daily as instructed. 200 each 3  . Prenatal Vit-Fe Fumarate-FA (PRENATAL VITAMINS PLUS) 27-1 MG TABS Take 1 tablet by mouth daily. 30 tablet 12   No current facility-administered medications on file prior to visit.   No Known Allergies   Family History  Problem Relation Age of Onset  . Diabetes Father   . Kidney disease Father   . Diabetes Sister   . Diabetes Brother    PE: BP 110/68 mmHg  Pulse 77  Temp(Src) 97.8 F (36.6 C) (Oral)  Resp 12  Wt 132 lb 12.8 oz (60.238 kg)  SpO2 97%  LMP 11/06/2013 (Exact Date) Wt Readings from Last 3 Encounters:  05/12/14 132 lb 12.8 oz (60.238 kg)  05/01/14 130 lb 12.8 oz (59.33 kg)  04/17/14 129 lb 11.2 oz (58.832 kg)   Constitutional: normal weight, in NAD Eyes: PERRLA, EOMI, no exophthalmos ENT: moist mucous membranes, no thyromegaly, no cervical lymphadenopathy Cardiovascular: RRR, No MRG Respiratory: CTA B Gastrointestinal: abdomen soft, NT, ND, BS+ Musculoskeletal: no deformities, strength intact in all 4 Skin: moist, warm, no rashes Neurological: no tremor with outstretched hands, DTR normal in all 4  ASSESSMENT: 1. DM2, insulin-dependent, uncontrolled, without complications, during second trimester  PLAN:  1. Patient with several years h/o diabetes, now pregnant - good control with occasional spikes after meals >> will try to switch from Glipizide XL 2.5 mg to Glipizide 2.5 mg bid and increase to 5 mg as needed.  I suggested to:  Patient Instructions  Continue: - Metformin XR 500 mg after each meal  Stop Glipizide XL  2.5 mg and start: - Glipizide 2.5 mg before breakfast and before dinner.  If you have a larger meal or dessert, you may try 5 mg before that meal.   Please stop at the lab.  Please return in 1-2 month with your sugar log.   - goals of CBGs in pregnancy: Before meals <95, 1h after a meal <140 2h after a meal <120 - continue checking sugars at different times of the day - check 6 times a day, rotating checks - check HbA1c - Return to clinic in 1-2 mo with sugar log   Office Visit on 05/12/2014  Component Date Value Ref Range Status  . Hgb A1c MFr Bld 05/12/2014 6.2  4.6 - 6.5 %  Final   Glycemic Control Guidelines for People with Diabetes:Non Diabetic:  <6%Goal of Therapy: <7%Additional Action Suggested:  >8%    HbA1c at goal.

## 2014-05-12 NOTE — Patient Instructions (Signed)
Continue: - Metformin XR 500 mg after each meal  Stop Glipizide XL 2.5 mg and start: - Glipizide 2.5 mg before breakfast and before dinner.  If you have a larger meal or dessert, you may try 5 mg before that meal.   Please stop at the lab.  Please return in 1-2 month with your sugar log.

## 2014-05-15 ENCOUNTER — Encounter: Payer: Medicaid Other | Admitting: Family Medicine

## 2014-05-22 ENCOUNTER — Ambulatory Visit (INDEPENDENT_AMBULATORY_CARE_PROVIDER_SITE_OTHER): Payer: Medicaid Other | Admitting: Obstetrics and Gynecology

## 2014-05-22 VITALS — BP 110/75 | HR 87 | Temp 98.5°F | Wt 131.5 lb

## 2014-05-22 DIAGNOSIS — Z23 Encounter for immunization: Secondary | ICD-10-CM | POA: Diagnosis not present

## 2014-05-22 DIAGNOSIS — O24319 Unspecified pre-existing diabetes mellitus in pregnancy, unspecified trimester: Secondary | ICD-10-CM

## 2014-05-22 DIAGNOSIS — E119 Type 2 diabetes mellitus without complications: Secondary | ICD-10-CM

## 2014-05-22 DIAGNOSIS — N90810 Female genital mutilation status, unspecified: Secondary | ICD-10-CM

## 2014-05-22 HISTORY — DX: Female genital mutilation status, unspecified: N90.810

## 2014-05-22 LAB — CBC
HCT: 33.3 % — ABNORMAL LOW (ref 36.0–46.0)
Hemoglobin: 11.2 g/dL — ABNORMAL LOW (ref 12.0–15.0)
MCH: 28.4 pg (ref 26.0–34.0)
MCHC: 33.6 g/dL (ref 30.0–36.0)
MCV: 84.5 fL (ref 78.0–100.0)
MPV: 12.4 fL (ref 8.6–12.4)
Platelets: 155 10*3/uL (ref 150–400)
RBC: 3.94 MIL/uL (ref 3.87–5.11)
RDW: 13.5 % (ref 11.5–15.5)
WBC: 5.5 10*3/uL (ref 4.0–10.5)

## 2014-05-22 LAB — POCT URINALYSIS DIP (DEVICE)
Bilirubin Urine: NEGATIVE
GLUCOSE, UA: 250 mg/dL — AB
Hgb urine dipstick: NEGATIVE
Leukocytes, UA: NEGATIVE
NITRITE: NEGATIVE
Protein, ur: NEGATIVE mg/dL
Specific Gravity, Urine: 1.015 (ref 1.005–1.030)
Urobilinogen, UA: 0.2 mg/dL (ref 0.0–1.0)
pH: 5.5 (ref 5.0–8.0)

## 2014-05-22 LAB — OB RESULTS CONSOLE RPR: RPR: NONREACTIVE

## 2014-05-22 LAB — HIV ANTIBODY (ROUTINE TESTING W REFLEX): HIV 1&2 Ab, 4th Generation: NONREACTIVE

## 2014-05-22 MED ORDER — TETANUS-DIPHTH-ACELL PERTUSSIS 5-2.5-18.5 LF-MCG/0.5 IM SUSP
0.5000 mL | Freq: Once | INTRAMUSCULAR | Status: AC
  Administered 2014-05-22: 0.5 mL via INTRAMUSCULAR

## 2014-05-22 NOTE — Progress Notes (Signed)
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  Met with endocrine last week who recommended the following changes: Last week moved from SR glipizide 2.5 mg to 2.5 IR mg BID. Still on metformin.  Did not bring glucose log, she states last week it was not good.   Had 24 wk growth scan and EFW was 78%, F/up scheduled 6/2  + FM, no VB, no LOF, no contractions

## 2014-05-22 NOTE — Progress Notes (Signed)
Ketones noted in urine.

## 2014-05-22 NOTE — Addendum Note (Signed)
Addended by: Louanna RawAMPBELL, Tashima Scarpulla M on: 05/22/2014 12:04 PM   Modules accepted: Orders

## 2014-05-23 LAB — RPR

## 2014-05-28 ENCOUNTER — Encounter: Payer: Self-pay | Admitting: Internal Medicine

## 2014-05-29 ENCOUNTER — Other Ambulatory Visit: Payer: Self-pay | Admitting: Internal Medicine

## 2014-05-29 DIAGNOSIS — O24319 Unspecified pre-existing diabetes mellitus in pregnancy, unspecified trimester: Secondary | ICD-10-CM

## 2014-05-29 MED ORDER — GLIPIZIDE ER 2.5 MG PO TB24: 2.5000 mg | ORAL_TABLET | Freq: Every day | ORAL | Status: DC

## 2014-06-01 ENCOUNTER — Encounter: Payer: Self-pay | Admitting: Internal Medicine

## 2014-06-08 ENCOUNTER — Encounter (HOSPITAL_COMMUNITY): Payer: Self-pay

## 2014-06-08 ENCOUNTER — Ambulatory Visit (HOSPITAL_COMMUNITY)
Admission: RE | Admit: 2014-06-08 | Discharge: 2014-06-08 | Disposition: A | Discharge status: Home / Self Care | Payer: Medicaid Other | Source: Ambulatory Visit | Attending: Family Medicine | Admitting: Family Medicine

## 2014-06-08 DIAGNOSIS — Z3A30 30 weeks gestation of pregnancy: Secondary | ICD-10-CM | POA: Diagnosis not present

## 2014-06-08 DIAGNOSIS — Z98891 History of uterine scar from previous surgery: Secondary | ICD-10-CM

## 2014-06-08 DIAGNOSIS — O3421 Maternal care for scar from previous cesarean delivery: Secondary | ICD-10-CM | POA: Diagnosis not present

## 2014-06-08 DIAGNOSIS — O24313 Unspecified pre-existing diabetes mellitus in pregnancy, third trimester: Secondary | ICD-10-CM | POA: Diagnosis not present

## 2014-06-08 DIAGNOSIS — E119 Type 2 diabetes mellitus without complications: Secondary | ICD-10-CM | POA: Diagnosis not present

## 2014-06-12 ENCOUNTER — Ambulatory Visit (INDEPENDENT_AMBULATORY_CARE_PROVIDER_SITE_OTHER): Payer: Medicaid Other | Admitting: Family Medicine

## 2014-06-12 ENCOUNTER — Encounter: Payer: Self-pay | Admitting: Internal Medicine

## 2014-06-12 VITALS — BP 106/65 | HR 69 | Temp 98.3°F | Wt 133.5 lb

## 2014-06-12 DIAGNOSIS — O3421 Maternal care for scar from previous cesarean delivery: Secondary | ICD-10-CM

## 2014-06-12 DIAGNOSIS — O24319 Unspecified pre-existing diabetes mellitus in pregnancy, unspecified trimester: Secondary | ICD-10-CM

## 2014-06-12 DIAGNOSIS — O0990 Supervision of high risk pregnancy, unspecified, unspecified trimester: Secondary | ICD-10-CM | POA: Insufficient documentation

## 2014-06-12 DIAGNOSIS — O34219 Maternal care for unspecified type scar from previous cesarean delivery: Secondary | ICD-10-CM

## 2014-06-12 DIAGNOSIS — O0993 Supervision of high risk pregnancy, unspecified, third trimester: Secondary | ICD-10-CM | POA: Diagnosis not present

## 2014-06-12 DIAGNOSIS — E119 Type 2 diabetes mellitus without complications: Secondary | ICD-10-CM | POA: Diagnosis not present

## 2014-06-12 LAB — POCT URINALYSIS DIP (DEVICE)
BILIRUBIN URINE: NEGATIVE
Glucose, UA: NEGATIVE mg/dL
Hgb urine dipstick: NEGATIVE
Ketones, ur: 80 mg/dL — AB
LEUKOCYTES UA: NEGATIVE
Nitrite: NEGATIVE
Protein, ur: 30 mg/dL — AB
Specific Gravity, Urine: 1.02 (ref 1.005–1.030)
Urobilinogen, UA: 1 mg/dL (ref 0.0–1.0)
pH: 7 (ref 5.0–8.0)

## 2014-06-12 NOTE — Progress Notes (Signed)
  Subjective:   Theresa Koch is a 34 y.o. G2P1001 at 7247w1d being seen today for her obstetrical visit.  Patient reports no complaints.   Contractions: Contractions: Not present.   Vaginal Bleeding Vag. Bleeding: None.   Fetal Movement: Movement: Present.    The following portions of the patient's history were reviewed and updated as appropriate: allergies, current medications, past family history, past medical history, past social history, past surgical history and problem list.   Objective:  BP 106/65 mmHg  Pulse 69  Temp(Src) 98.3 F (36.8 C)  Wt 133 lb 8 oz (60.555 kg)  LMP 11/06/2013 (Exact Date) Fetal Heart Rate: Fetal Heart Rate (bpm): 133  Fetal Movement: Movement: Present   Abdomen: Soft, gravid, appropriate for gestational age.  Pain/Pressure: Pain/Pressure: Absent     Vaginal: Vaginal Bleeding Vag. Bleeding: None     Extremities: Edema: Edema: None    Reports BS are 140 prior to meals and not less than 200 after meals. Has no book Assessment and Plan:   Pregnancy:  G2P1001 at 8547w1d  1. Maternal pregestational diabetes classes B through R, antepartum  - She reports being on glyburide 10 mg daily. If no improvement may need to consider insulin. Currently being managed by Endocrinology  - Begin 2x/wk testing next week.  2. Previous cesarean section complicating pregnancy, antepartum condition or complication - Desires TOLAC  3. Supervision of high risk pregnancy, antepartum, third trimester -continue standard obstetric care    Fetal movement precautions reviewed.  Follow up in 1 week.   Reva Boresanya S Jacen Carlini, MD

## 2014-06-12 NOTE — Progress Notes (Signed)
Discussed breast feeding tip of the week.  Has not yet left urine sample.

## 2014-06-12 NOTE — Patient Instructions (Signed)
Gestational Diabetes Mellitus Gestational diabetes mellitus, often simply referred to as gestational diabetes, is a type of diabetes that some women develop during pregnancy. In gestational diabetes, the pancreas does not make enough insulin (a hormone), the cells are less responsive to the insulin that is made (insulin resistance), or both.Normally, insulin moves sugars from food into the tissue cells. The tissue cells use the sugars for energy. The lack of insulin or the lack of normal response to insulin causes excess sugars to build up in the blood instead of going into the tissue cells. As a result, high blood sugar (hyperglycemia) develops. The effect of high sugar (glucose) levels can cause many problems.  RISK FACTORS You have an increased chance of developing gestational diabetes if you have a family history of diabetes and also have one or more of the following risk factors:  A body mass index over 30 (obesity).  A previous pregnancy with gestational diabetes.  An older age at the time of pregnancy. If blood glucose levels are kept in the normal range during pregnancy, women can have a healthy pregnancy. If your blood glucose levels are not well controlled, there may be risks to you, your unborn baby (fetus), your labor and delivery, or your newborn baby.  SYMPTOMS  If symptoms are experienced, they are much like symptoms you would normally expect during pregnancy. The symptoms of gestational diabetes include:   Increased thirst (polydipsia).  Increased urination (polyuria).  Increased urination during the night (nocturia).  Weight loss. This weight loss may be rapid.  Frequent, recurring infections.  Tiredness (fatigue).  Weakness.  Vision changes, such as blurred vision.  Fruity smell to your breath.  Abdominal pain. DIAGNOSIS Diabetes is diagnosed when blood glucose levels are increased. Your blood glucose level may be checked by one or more of the following blood  tests:  A fasting blood glucose test. You will not be allowed to eat for at least 8 hours before a blood sample is taken.  A random blood glucose test. Your blood glucose is checked at any time of the day regardless of when you ate.  A hemoglobin A1c blood glucose test. A hemoglobin A1c test provides information about blood glucose control over the previous 3 months.  An oral glucose tolerance test (OGTT). Your blood glucose is measured after you have not eaten (fasted) for 1-3 hours and then after you drink a glucose-containing beverage. Since the hormones that cause insulin resistance are highest at about 24-28 weeks of a pregnancy, an OGTT is usually performed during that time. If you have risk factors for gestational diabetes, your health care provider may test you for gestational diabetes earlier than 24 weeks of pregnancy. TREATMENT   You will need to take diabetes medicine or insulin daily to keep blood glucose levels in the desired range.  You will need to match insulin dosing with exercise and healthy food choices. The treatment goal is to maintain the before-meal (preprandial), bedtime, and overnight blood glucose level at 60-99 mg/dL during pregnancy. The treatment goal is to further maintain peak after-meal blood sugar (postprandial glucose) level at 100-140 mg/dL. HOME CARE INSTRUCTIONS   Have your hemoglobin A1c level checked twice a year.  Perform daily blood glucose monitoring as directed by your health care provider. It is common to perform frequent blood glucose monitoring.  Monitor urine ketones when you are ill and as directed by your health care provider.  Take your diabetes medicine and insulin as directed by your health care provider   to maintain your blood glucose level in the desired range.  Never run out of diabetes medicine or insulin. It is needed every day.  Adjust insulin based on your intake of carbohydrates. Carbohydrates can raise blood glucose levels but  need to be included in your diet. Carbohydrates provide vitamins, minerals, and fiber which are an essential part of a healthy diet. Carbohydrates are found in fruits, vegetables, whole grains, dairy products, legumes, and foods containing added sugars.  Eat healthy foods. Alternate 3 meals with 3 snacks.  Maintain a healthy weight gain. The usual total expected weight gain varies according to your prepregnancy body mass index (BMI).  Carry a medical alert card or wear your medical alert jewelry.  Carry a 15-gram carbohydrate snack with you at all times to treat low blood glucose (hypoglycemia). Some examples of 15-gram carbohydrate snacks include:  Glucose tablets, 3 or 4.  Glucose gel, 15-gram tube.  Raisins, 2 tablespoons (24 g).  Jelly beans, 6.  Animal crackers, 8.  Fruit juice, regular soda, or low-fat milk, 4 ounces (120 mL).  Gummy treats, 9.  Recognize hypoglycemia. Hypoglycemia during pregnancy occurs with blood glucose levels of 60 mg/dL and below. The risk for hypoglycemia increases when fasting or skipping meals, during or after intense exercise, and during sleep. Hypoglycemia symptoms can include:  Tremors or shakes.  Decreased ability to concentrate.  Sweating.  Increased heart rate.  Headache.  Dry mouth.  Hunger.  Irritability.  Anxiety.  Restless sleep.  Altered speech or coordination.  Confusion.  Treat hypoglycemia promptly. If you are alert and able to safely swallow, follow the 15:15 rule:  Take 15-20 grams of rapid-acting glucose or carbohydrate. Rapid-acting options include glucose gel, glucose tablets, or 4 ounces (120 mL) of fruit juice, regular soda, or low-fat milk.  Check your blood glucose level 15 minutes after taking the glucose.  Take 15-20 grams more of glucose if the repeat blood glucose level is still 70 mg/dL or below.  Eat a meal or snack within 1 hour once blood glucose levels return to normal.  Be alert to polyuria  (excess urination) and polydipsia (excess thirst) which are early signs of hyperglycemia. An early awareness of hyperglycemia allows for prompt treatment. Treat hyperglycemia as directed by your health care provider.  Engage in at least 30 minutes of physical activity a day or as directed by your health care provider. Ten minutes of physical activity timed 30 minutes after each meal is encouraged to control postprandial blood glucose levels.  Adjust your insulin dosing and food intake as needed if you start a new exercise or sport.  Follow your sick-day plan at any time you are unable to eat or drink as usual.  Avoid tobacco and alcohol use.  Keep all follow-up visits as directed by your health care provider.  Follow the advice of your health care provider regarding your prenatal and post-delivery (postpartum) appointments, meal planning, exercise, medicines, vitamins, blood tests, other medical tests, and physical activities.  Perform daily skin and foot care. Examine your skin and feet daily for cuts, bruises, redness, nail problems, bleeding, blisters, or sores.  Brush your teeth and gums at least twice a day and floss at least once a day. Follow up with your dentist regularly.  Schedule an eye exam during the first trimester of your pregnancy or as directed by your health care provider.  Share your diabetes management plan with your workplace or school.  Stay up-to-date with immunizations.  Learn to manage stress.    Obtain ongoing diabetes education and support as needed.  Learn about and consider breastfeeding your baby.  You should have your blood sugar level checked 6-12 weeks after delivery. This is done with an oral glucose tolerance test (OGTT). SEEK MEDICAL CARE IF:   You are unable to eat food or drink fluids for more than 6 hours.  You have nausea and vomiting for more than 6 hours.  You have a blood glucose level of 200 mg/dL and you have ketones in your  urine.  There is a change in mental status.  You develop vision problems.  You have a persistent headache.  You have upper abdominal pain or discomfort.  You develop an additional serious illness.  You have diarrhea for more than 6 hours.  You have been sick or have had a fever for a couple of days and are not getting better. SEEK IMMEDIATE MEDICAL CARE IF:   You have difficulty breathing.  You no longer feel the baby moving.  You are bleeding or have discharge from your vagina.  You start having premature contractions or labor. MAKE SURE YOU:  Understand these instructions.  Will watch your condition.  Will get help right away if you are not doing well or get worse. Document Released: 03/31/2000 Document Revised: 05/09/2013 Document Reviewed: 07/22/2011 [employer] Patient Information 2015 [employer]. This information is not intended to replace advice given to you by your health care provider. Make sure you discuss any questions you have with your health care provider.  Third Trimester of Pregnancy The third trimester is from week 29 through week 42, months 7 through 9. The third trimester is a time when the fetus is growing rapidly. At the end of the ninth month, the fetus is about 20 inches in length and weighs 6-10 pounds.  BODY CHANGES Your body goes through many changes during pregnancy. The changes vary from woman to woman.   Your weight will continue to increase. You can expect to gain 25-35 pounds (11-16 kg) by the end of the pregnancy.  You may begin to get stretch marks on your hips, abdomen, and breasts.  You may urinate more often because the fetus is moving lower into your pelvis and pressing on your bladder.  You may develop or continue to have heartburn as a result of your pregnancy.  You may develop constipation because certain hormones are causing the muscles that push waste through your intestines to slow down.  You may develop hemorrhoids or  swollen, bulging veins (varicose veins).  You may have pelvic pain because of the weight gain and pregnancy hormones relaxing your joints between the bones in your pelvis. Backaches may result from overexertion of the muscles supporting your posture.  You may have changes in your hair. These can include thickening of your hair, rapid growth, and changes in texture. Some women also have hair loss during or after pregnancy, or hair that feels dry or thin. Your hair will most likely return to normal after your baby is born.  Your breasts will continue to grow and be tender. A yellow discharge may leak from your breasts called colostrum.  Your belly button may stick out.  You may feel short of breath because of your expanding uterus.  You may notice the fetus "dropping," or moving lower in your abdomen.  You may have a bloody mucus discharge. This usually occurs a few days to a week before labor begins.  Your cervix becomes thin and soft (effaced) near your due   date. WHAT TO EXPECT AT YOUR PRENATAL EXAMS  You will have prenatal exams every 2 weeks until week 36. Then, you will have weekly prenatal exams. During a routine prenatal visit:  You will be weighed to make sure you and the fetus are growing normally.  Your blood pressure is taken.  Your abdomen will be measured to track your baby's growth.  The fetal heartbeat will be listened to.  Any test results from the previous visit will be discussed.  You may have a cervical check near your due date to see if you have effaced. At around 36 weeks, your caregiver will check your cervix. At the same time, your caregiver will also perform a test on the secretions of the vaginal tissue. This test is to determine if a type of bacteria, Group B streptococcus, is present. Your caregiver will explain this further. Your caregiver may ask you:  What your birth plan is.  How you are feeling.  If you are feeling the baby move.  If you have had  any abnormal symptoms, such as leaking fluid, bleeding, severe headaches, or abdominal cramping.  If you have any questions. Other tests or screenings that may be performed during your third trimester include:  Blood tests that check for low iron levels (anemia).  Fetal testing to check the health, activity level, and growth of the fetus. Testing is done if you have certain medical conditions or if there are problems during the pregnancy. FALSE LABOR You may feel small, irregular contractions that eventually go away. These are called Braxton Hicks contractions, or false labor. Contractions may last for hours, days, or even weeks before true labor sets in. If contractions come at regular intervals, intensify, or become painful, it is best to be seen by your caregiver.  SIGNS OF LABOR   Menstrual-like cramps.  Contractions that are 5 minutes apart or less.  Contractions that start on the top of the uterus and spread down to the lower abdomen and back.  A sense of increased pelvic pressure or back pain.  A watery or bloody mucus discharge that comes from the vagina. If you have any of these signs before the 37th week of pregnancy, call your caregiver right away. You need to go to the hospital to get checked immediately. HOME CARE INSTRUCTIONS   Avoid all smoking, herbs, alcohol, and unprescribed drugs. These chemicals affect the formation and growth of the baby.  Follow your caregiver's instructions regarding medicine use. There are medicines that are either safe or unsafe to take during pregnancy.  Exercise only as directed by your caregiver. Experiencing uterine cramps is a good sign to stop exercising.  Continue to eat regular, healthy meals.  Wear a good support bra for breast tenderness.  Do not use hot tubs, steam rooms, or saunas.  Wear your seat belt at all times when driving.  Avoid raw meat, uncooked cheese, cat litter boxes, and soil used by cats. These carry germs that  can cause birth defects in the baby.  Take your prenatal vitamins.  Try taking a stool softener (if your caregiver approves) if you develop constipation. Eat more high-fiber foods, such as fresh vegetables or fruit and whole grains. Drink plenty of fluids to keep your urine clear or pale yellow.  Take warm sitz baths to soothe any pain or discomfort caused by hemorrhoids. Use hemorrhoid cream if your caregiver approves.  If you develop varicose veins, wear support hose. Elevate your feet for 15 minutes, 3-4 times a   day. Limit salt in your diet.  Avoid heavy lifting, wear low heal shoes, and practice good posture.  Rest a lot with your legs elevated if you have leg cramps or low back pain.  Visit your dentist if you have not gone during your pregnancy. Use a soft toothbrush to brush your teeth and be gentle when you floss.  A sexual relationship may be continued unless your caregiver directs you otherwise.  Do not travel far distances unless it is absolutely necessary and only with the approval of your caregiver.  Take prenatal classes to understand, practice, and ask questions about the labor and delivery.  Make a trial run to the hospital.  Pack your hospital bag.  Prepare the baby's nursery.  Continue to go to all your prenatal visits as directed by your caregiver. SEEK MEDICAL CARE IF:  You are unsure if you are in labor or if your water has broken.  You have dizziness.  You have mild pelvic cramps, pelvic pressure, or nagging pain in your abdominal area.  You have persistent nausea, vomiting, or diarrhea.  You have a bad smelling vaginal discharge.  You have pain with urination. SEEK IMMEDIATE MEDICAL CARE IF:   You have a fever.  You are leaking fluid from your vagina.  You have spotting or bleeding from your vagina.  You have severe abdominal cramping or pain.  You have rapid weight loss or gain.  You have shortness of breath with chest pain.  You notice  sudden or extreme swelling of your face, hands, ankles, feet, or legs.  You have not felt your baby move in over an hour.  You have severe headaches that do not go away with medicine.  You have vision changes. Document Released: 12/17/2000 Document Revised: 12/28/2012 Document Reviewed: 02/24/2012 [employer] Patient Information 2015 [employer]. This information is not intended to replace advice given to you by your health care provider. Make sure you discuss any questions you have with your health care provider.  Breastfeeding Deciding to breastfeed is one of the best choices you can make for you and your baby. A change in hormones during pregnancy causes your breast tissue to grow and increases the number and size of your milk ducts. These hormones also allow proteins, sugars, and fats from your blood supply to make breast milk in your milk-producing glands. Hormones prevent breast milk from being released before your baby is born as well as prompt milk flow after birth. Once breastfeeding has begun, thoughts of your baby, as well as his or her sucking or crying, can stimulate the release of milk from your milk-producing glands.  BENEFITS OF BREASTFEEDING For Your Baby  Your first milk (colostrum) helps your baby's digestive system function better.   There are antibodies in your milk that help your baby fight off infections.   Your baby has a lower incidence of asthma, allergies, and sudden infant death syndrome.   The nutrients in breast milk are better for your baby than infant formulas and are designed uniquely for your baby's needs.   Breast milk improves your baby's brain development.   Your baby is less likely to develop other conditions, such as childhood obesity, asthma, or type 2 diabetes mellitus.  For You   Breastfeeding helps to create a very special bond between you and your baby.   Breastfeeding is convenient. Breast milk is always available at the correct  temperature and costs nothing.   Breastfeeding helps to burn calories and helps you lose   the weight gained during pregnancy.   Breastfeeding makes your uterus contract to its prepregnancy size faster and slows bleeding (lochia) after you give birth.   Breastfeeding helps to lower your risk of developing type 2 diabetes mellitus, osteoporosis, and breast or ovarian cancer later in life. SIGNS THAT YOUR BABY IS HUNGRY Early Signs of Hunger  Increased alertness or activity.  Stretching.  Movement of the head from side to side.  Movement of the head and opening of the mouth when the corner of the mouth or cheek is stroked (rooting).  Increased sucking sounds, smacking lips, cooing, sighing, or squeaking.  Hand-to-mouth movements.  Increased sucking of fingers or hands. Late Signs of Hunger  Fussing.  Intermittent crying. Extreme Signs of Hunger Signs of extreme hunger will require calming and consoling before your baby will be able to breastfeed successfully. Do not wait for the following signs of extreme hunger to occur before you initiate breastfeeding:   Restlessness.  A loud, strong cry.   Screaming. BREASTFEEDING BASICS Breastfeeding Initiation  Find a comfortable place to sit or lie down, with your neck and back well supported.  Place a pillow or rolled up blanket under your baby to bring him or her to the level of your breast (if you are seated). Nursing pillows are specially designed to help support your arms and your baby while you breastfeed.  Make sure that your baby's abdomen is facing your abdomen.   Gently massage your breast. With your fingertips, massage from your chest wall toward your nipple in a circular motion. This encourages milk flow. You may need to continue this action during the feeding if your milk flows slowly.  Support your breast with 4 fingers underneath and your thumb above your nipple. Make sure your fingers are well away from your  nipple and your baby's mouth.   Stroke your baby's lips gently with your finger or nipple.   When your baby's mouth is open wide enough, quickly bring your baby to your breast, placing your entire nipple and as much of the colored area around your nipple (areola) as possible into your baby's mouth.   More areola should be visible above your baby's upper lip than below the lower lip.   Your baby's tongue should be between his or her lower gum and your breast.   Ensure that your baby's mouth is correctly positioned around your nipple (latched). Your baby's lips should create a seal on your breast and be turned out (everted).  It is common for your baby to suck about 2-3 minutes in order to start the flow of breast milk. Latching Teaching your baby how to latch on to your breast properly is very important. An improper latch can cause nipple pain and decreased milk supply for you and poor weight gain in your baby. Also, if your baby is not latched onto your nipple properly, he or she may swallow some air during feeding. This can make your baby fussy. Burping your baby when you switch breasts during the feeding can help to get rid of the air. However, teaching your baby to latch on properly is still the best way to prevent fussiness from swallowing air while breastfeeding. Signs that your baby has successfully latched on to your nipple:    Silent tugging or silent sucking, without causing you pain.   Swallowing heard between every 3-4 sucks.    Muscle movement above and in front of his or her ears while sucking.  Signs that your   baby has not successfully latched on to nipple:   Sucking sounds or smacking sounds from your baby while breastfeeding.  Nipple pain. If you think your baby has not latched on correctly, slip your finger into the corner of your baby's mouth to break the suction and place it between your baby's gums. Attempt breastfeeding initiation again. Signs of Successful  Breastfeeding Signs from your baby:   A gradual decrease in the number of sucks or complete cessation of sucking.   Falling asleep.   Relaxation of his or her body.   Retention of a small amount of milk in his or her mouth.   Letting go of your breast by himself or herself. Signs from you:  Breasts that have increased in firmness, weight, and size 1-3 hours after feeding.   Breasts that are softer immediately after breastfeeding.  Increased milk volume, as well as a change in milk consistency and color by the fifth day of breastfeeding.   Nipples that are not sore, cracked, or bleeding. Signs That Your Baby is Getting Enough Milk  Wetting at least 3 diapers in a 24-hour period. The urine should be clear and pale yellow by age 5 days.  At least 3 stools in a 24-hour period by age 5 days. The stool should be soft and yellow.  At least 3 stools in a 24-hour period by age 7 days. The stool should be seedy and yellow.  No loss of weight greater than 10% of birth weight during the first 3 days of age.  Average weight gain of 4-7 ounces (113-198 g) per week after age 4 days.  Consistent daily weight gain by age 5 days, without weight loss after the age of 2 weeks. After a feeding, your baby may spit up a small amount. This is common. BREASTFEEDING FREQUENCY AND DURATION Frequent feeding will help you make more milk and can prevent sore nipples and breast engorgement. Breastfeed when you feel the need to reduce the fullness of your breasts or when your baby shows signs of hunger. This is called "breastfeeding on demand." Avoid introducing a pacifier to your baby while you are working to establish breastfeeding (the first 4-6 weeks after your baby is born). After this time you may choose to use a pacifier. Research has shown that pacifier use during the first year of a baby's life decreases the risk of sudden infant death syndrome (SIDS). Allow your baby to feed on each breast as  long as he or she wants. Breastfeed until your baby is finished feeding. When your baby unlatches or falls asleep while feeding from the first breast, offer the second breast. Because newborns are often sleepy in the first few weeks of life, you may need to awaken your baby to get him or her to feed. Breastfeeding times will vary from baby to baby. However, the following rules can serve as a guide to help you ensure that your baby is properly fed:  Newborns (babies 4 weeks of age or younger) may breastfeed every 1-3 hours.  Newborns should not go longer than 3 hours during the day or 5 hours during the night without breastfeeding.  You should breastfeed your baby a minimum of 8 times in a 24-hour period until you begin to introduce solid foods to your baby at around 6 months of age. BREAST MILK PUMPING Pumping and storing breast milk allows you to ensure that your baby is exclusively fed your breast milk, even at times when you are unable to   breastfeed. This is especially important if you are going back to work while you are still breastfeeding or when you are not able to be present during feedings. Your lactation consultant can give you guidelines on how long it is safe to store breast milk.  A breast pump is a machine that allows you to pump milk from your breast into a sterile bottle. The pumped breast milk can then be stored in a refrigerator or freezer. Some breast pumps are operated by hand, while others use electricity. Ask your lactation consultant which type will work best for you. Breast pumps can be purchased, but some hospitals and breastfeeding support groups lease breast pumps on a monthly basis. A lactation consultant can teach you how to hand express breast milk, if you prefer not to use a pump.  CARING FOR YOUR BREASTS WHILE YOU BREASTFEED Nipples can become dry, cracked, and sore while breastfeeding. The following recommendations can help keep your breasts moisturized and  healthy:  Avoid using soap on your nipples.   Wear a supportive bra. Although not required, special nursing bras and tank tops are designed to allow access to your breasts for breastfeeding without taking off your entire bra or top. Avoid wearing underwire-style bras or extremely tight bras.  Air dry your nipples for 3-4minutes after each feeding.   Use only cotton bra pads to absorb leaked breast milk. Leaking of breast milk between feedings is normal.   Use lanolin on your nipples after breastfeeding. Lanolin helps to maintain your skin's normal moisture barrier. If you use pure lanolin, you do not need to wash it off before feeding your baby again. Pure lanolin is not toxic to your baby. You may also hand express a few drops of breast milk and gently massage that milk into your nipples and allow the milk to air dry. In the first few weeks after giving birth, some women experience extremely full breasts (engorgement). Engorgement can make your breasts feel heavy, warm, and tender to the touch. Engorgement peaks within 3-5 days after you give birth. The following recommendations can help ease engorgement:  Completely empty your breasts while breastfeeding or pumping. You may want to start by applying warm, moist heat (in the shower or with warm water-soaked hand towels) just before feeding or pumping. This increases circulation and helps the milk flow. If your baby does not completely empty your breasts while breastfeeding, pump any extra milk after he or she is finished.  Wear a snug bra (nursing or regular) or tank top for 1-2 days to signal your body to slightly decrease milk production.  Apply ice packs to your breasts, unless this is too uncomfortable for you.  Make sure that your baby is latched on and positioned properly while breastfeeding. If engorgement persists after 48 hours of following these recommendations, contact your health care provider or a lactation consultant. OVERALL  HEALTH CARE RECOMMENDATIONS WHILE BREASTFEEDING  Eat healthy foods. Alternate between meals and snacks, eating 3 of each per day. Because what you eat affects your breast milk, some of the foods may make your baby more irritable than usual. Avoid eating these foods if you are sure that they are negatively affecting your baby.  Drink milk, fruit juice, and water to satisfy your thirst (about 10 glasses a day).   Rest often, relax, and continue to take your prenatal vitamins to prevent fatigue, stress, and anemia.  Continue breast self-awareness checks.  Avoid chewing and smoking tobacco.  Avoid alcohol and drug use.   Some medicines that may be harmful to your baby can pass through breast milk. It is important to ask your health care provider before taking any medicine, including all over-the-counter and prescription medicine as well as vitamin and herbal supplements. It is possible to become pregnant while breastfeeding. If birth control is desired, ask your health care provider about options that will be safe for your baby. SEEK MEDICAL CARE IF:   You feel like you want to stop breastfeeding or have become frustrated with breastfeeding.  You have painful breasts or nipples.  Your nipples are cracked or bleeding.  Your breasts are red, tender, or warm.  You have a swollen area on either breast.  You have a fever or chills.  You have nausea or vomiting.  You have drainage other than breast milk from your nipples.  Your breasts do not become full before feedings by the fifth day after you give birth.  You feel sad and depressed.  Your baby is too sleepy to eat well.  Your baby is having trouble sleeping.   Your baby is wetting less than 3 diapers in a 24-hour period.  Your baby has less than 3 stools in a 24-hour period.  Your baby's skin or the white part of his or her eyes becomes yellow.   Your baby is not gaining weight by 5 days of age. SEEK IMMEDIATE MEDICAL CARE  IF:   Your baby is overly tired (lethargic) and does not want to wake up and feed.  Your baby develops an unexplained fever. Document Released: 12/23/2004 Document Revised: 12/28/2012 Document Reviewed: 06/16/2012 Exit[employer] Patient Information 2015 [employer]. This information is not intended to replace advice given to you by your health care provider. Make sure you discuss any questions you have with your health care provider.  

## 2014-06-14 ENCOUNTER — Other Ambulatory Visit: Payer: Self-pay | Admitting: Internal Medicine

## 2014-06-14 DIAGNOSIS — O24319 Unspecified pre-existing diabetes mellitus in pregnancy, unspecified trimester: Secondary | ICD-10-CM

## 2014-06-14 MED ORDER — GLIPIZIDE 5 MG PO TABS: 10.0000 mg | ORAL_TABLET | Freq: Two times a day (BID) | ORAL | Status: DC

## 2014-06-15 ENCOUNTER — Encounter: Payer: Self-pay | Admitting: Internal Medicine

## 2014-06-15 ENCOUNTER — Ambulatory Visit (INDEPENDENT_AMBULATORY_CARE_PROVIDER_SITE_OTHER): Payer: Medicaid Other | Admitting: Internal Medicine

## 2014-06-15 VITALS — BP 104/62 | HR 71 | Temp 97.8°F | Resp 12 | Wt 136.0 lb

## 2014-06-15 DIAGNOSIS — O24319 Unspecified pre-existing diabetes mellitus in pregnancy, unspecified trimester: Secondary | ICD-10-CM

## 2014-06-15 DIAGNOSIS — E119 Type 2 diabetes mellitus without complications: Secondary | ICD-10-CM

## 2014-06-15 MED ORDER — INSULIN PEN NEEDLE 32G X 4 MM MISC: Status: DC

## 2014-06-15 MED ORDER — INSULIN ASPART 100 UNIT/ML FLEXPEN: 3.0000 [IU] | PEN_INJECTOR | Freq: Three times a day (TID) | SUBCUTANEOUS | Status: DC

## 2014-06-15 NOTE — Patient Instructions (Signed)
Please continue: Continue: - Metformin XR 500 mg after each meal  Add: - Novolog 3 units before every meal (15 min before) and may need to increase by 1 unit if sugars after meals are not <140.  Stop: - Glipizide XL  Please return in 1-2 months with your sugar log.

## 2014-06-15 NOTE — Progress Notes (Signed)
Patient ID: Theresa Koch, female   DOB: Feb 11, 1980, 34 y.o.   MRN: [id number]  HPI: Theresa Koch is a 34 y.o.-year-old female, initially referred by Dr. Synetta Shadow, returning for f/u for DM2, dx in 2011, insulin-dependent since end 2013, uncontrolled, without complications. She is [redacted] weeks pregnant (will have a girl). She also has a 2 y/o daughter. Last visit 1.5 mo ago.  Last hemoglobin A1c was: Lab Results  Component Value Date   HGBA1C 6.2 05/12/2014   HGBA1C 7.6* 02/14/2014   HGBA1C 8.8 12/19/2013   Pt was on a regimen of: - Metformin XR 1500 mg po after dinner - NPH-R 70/30 8 units in am  She is now on: - Metformin XR 500 mg po after each meal  - Glipizide XL 5 mg before b'fast She was on Lantus >> tapered off 2/2 low CBGs.  Pt checks her sugars 3x a day and they are - higher: - am: 78-126 >> 58-80 >> 59, 75-95, 113 >> 73-117, 128 >> 95-140, 152 - 2h after b'fast: 92, 94 >> 96-164, 184 >> 78-142, 188, 217 >> 72-157, 209 >> 110-211, 264 - before lunch: n/c >> 53-116 >>  59, 66-113 >> 64-102 >> 74-140 - 2h after lunch: 139-176 >> 97-169, 221 >> 65, 86, 105-192 >> 84-134 >> 107-228, 267 - before dinner: has lunch late >> 72-89 >> 76, 86-135, 153, 180 >> 78-139, 163 >> 71-171 - 2h after dinner: 134-179 >> 93-159, 185 >> 109-167, 182 >> 133-266 - bedtime: n/c >> 148 >> 185 - nighttime: 56, 70-90 >> 79, 91 >> n/c No lows. Lowest sugar was 46 >> 53 >> 59 >> 64 >> 71; she has hypoglycemia awareness at 60.  Highest sugar was 221 >> 217x1 >> 209 >>   Glucometer: One Touch Ultra  Pt's meals are: - Breakfast: tea with milk (nausea)  - Lunch: salad, soup, cheese - takes 70/30 before this - Dinner: egg, cheese, sometimes cereals - Snacks:   - no CKD, no BUN/creatinine: Lab Results  Component Value Date   BUN 8 01/23/2014   Lab Results  Component Value Date   CREATININE 0.44* 01/23/2014   - no h/o HL No results found for: CHOL, HDL, LDLCALC, LDLDIRECT, TRIG, CHOLHDL  -  last eye exam was on 03/20/2014. No DR.  - no numbness and tingling in her feet.  ROS: Constitutional: no weight gain/loss, no fatigue, no subjective hyperthermia, no nocturia  Eyes: no blurry vision, no xerophthalmia ENT: no sore throat, no nodules palpated in throat, no dysphagia/odynophagia, no hoarseness Cardiovascular: no CP/SOB/palpitations/leg swelling Respiratory: no cough/SOB Gastrointestinal: no N/V/D/C Musculoskeletal: no muscle/joint aches Skin: no rashes Neurological: no tremors/numbness/tingling/dizziness  I reviewed pt's medications, allergies, PMH, social hx, family hx, and changes were documented in the history of present illness. Otherwise, unchanged from my initial visit note:  Past Medical History  Diagnosis Date  . Diabetes mellitus without complication    Past Surgical History  Procedure Laterality Date  . Cesarean section     History   Social History  . Marital Status: Married    Spouse Name: N/A    Number of Children: 1   Occupational History  . 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 Smoking status: Never Smoker   . Smokeless tobacco: Not on file  . Alcohol Use: No  . Drug Use: No   Current Outpatient Prescriptions on File Prior to Visit  Medication Sig Dispense Refill  .  ACCU-CHEK SOFTCLIX LANCETS lancets Use to test blood sugar 6 times daily as instructed. Dx code: O24.912 200 each 3  . aspirin EC 81 MG tablet Take 1 tablet (81 mg total) by mouth daily. Take after 12 weeks for prevention of preeclampssia later in pregnancy 300 tablet 2  . Blood Glucose Monitoring Suppl (ACCU-CHEK AVIVA PLUS) W/DEVICE KIT Use to test blood sugar 6 times daily as instructed. Dx code: W25.749 1 kit 0  . folic acid (FOLVITE) 1 MG tablet Take 1 tablet (1 mg total) by mouth daily. 100 tablet 12  . glipiZIDE (GLUCOTROL) 5 MG tablet Take 2 tablets (10 mg total) by mouth 2 (two) times daily before a meal. 30 tablet 2  . glucose  blood (ACCU-CHEK AVIVA PLUS) test strip Use to test blood sugar 6 times daily as instructed. Dx code: T55.217 200 each 3  . metFORMIN (GLUCOPHAGE) 500 MG tablet Take 1 tablet (500 mg total) by mouth 3 (three) times daily with meals. 90 tablet 2  . ONETOUCH DELICA LANCETS FINE MISC Use to test blood sugar 6 times daily as instructed. 200 each 3  . Prenatal Vit-Fe Fumarate-FA (PRENATAL VITAMINS PLUS) 27-1 MG TABS Take 1 tablet by mouth daily. 30 tablet 12   No current facility-administered medications on file prior to visit.   No Known Allergies   Family History  Problem Relation Age of Onset  . Diabetes Father   . Kidney disease Father   . Diabetes Sister   . Diabetes Brother    PE: BP 104/62 mmHg  Pulse 71  Temp(Src) 97.8 F (36.6 C) (Oral)  Resp 12  Wt 136 lb (61.689 kg)  SpO2 98%  LMP 11/06/2013 (Exact Date) Wt Readings from Last 3 Encounters:  06/15/14 136 lb (61.689 kg)  06/12/14 133 lb 8 oz (60.555 kg)  06/08/14 135 lb (61.236 kg)   Constitutional: normal weight, in NAD Eyes: PERRLA, EOMI, no exophthalmos ENT: moist mucous membranes, no thyromegaly, no cervical lymphadenopathy Cardiovascular: RRR, No MRG Respiratory: CTA B Gastrointestinal: abdomen soft, NT, ND, BS+ Musculoskeletal: no deformities, strength intact in all 4 Skin: moist, warm, no rashes Neurological: no tremor with outstretched hands, DTR normal in all 4  ASSESSMENT: 1. DM2, insulin-dependent, uncontrolled, without complications, during second trimester  PLAN:  1. Patient with several years h/o diabetes, now pregnant - sugars higher after meals >> will stop Glipizide and start low dose mealtime insulin - I suggested to:  Patient Instructions  Continue: - Metformin XR 500 mg after each meal  Add: - Novolog 3 units before every meal (15 min before) and may need to increase by 1 unit if sugars after meals are not <140.  Stop: - Glipizide XL  Please return in 1-2 months with your sugar log.     - goals of CBGs in pregnancy: Before meals <95, 1h after a meal <140 2h after a meal <120 - continue checking sugars at different times of the day - check 6 times a day, rotating checks - reviewed last A1c >> great! - Return to clinic in 1 mo with sugar log

## 2014-06-19 ENCOUNTER — Other Ambulatory Visit: Payer: Self-pay | Admitting: Obstetrics & Gynecology

## 2014-06-19 ENCOUNTER — Ambulatory Visit (INDEPENDENT_AMBULATORY_CARE_PROVIDER_SITE_OTHER): Payer: Medicaid Other | Admitting: Obstetrics & Gynecology

## 2014-06-19 ENCOUNTER — Ambulatory Visit (HOSPITAL_COMMUNITY): Payer: Medicaid Other

## 2014-06-19 VITALS — BP 98/62 | HR 71 | Wt 137.8 lb

## 2014-06-19 DIAGNOSIS — O24319 Unspecified pre-existing diabetes mellitus in pregnancy, unspecified trimester: Secondary | ICD-10-CM

## 2014-06-19 DIAGNOSIS — O34219 Maternal care for unspecified type scar from previous cesarean delivery: Secondary | ICD-10-CM

## 2014-06-19 DIAGNOSIS — E119 Type 2 diabetes mellitus without complications: Secondary | ICD-10-CM

## 2014-06-19 DIAGNOSIS — Z3A32 32 weeks gestation of pregnancy: Secondary | ICD-10-CM

## 2014-06-19 DIAGNOSIS — O24913 Unspecified diabetes mellitus in pregnancy, third trimester: Secondary | ICD-10-CM

## 2014-06-19 DIAGNOSIS — O09893 Supervision of other high risk pregnancies, third trimester: Secondary | ICD-10-CM | POA: Diagnosis not present

## 2014-06-19 LAB — POCT URINALYSIS DIP (DEVICE)
Bilirubin Urine: NEGATIVE
Glucose, UA: NEGATIVE mg/dL
HGB URINE DIPSTICK: NEGATIVE
Leukocytes, UA: NEGATIVE
NITRITE: NEGATIVE
PH: 5.5 (ref 5.0–8.0)
PROTEIN: 30 mg/dL — AB
Specific Gravity, Urine: >=1.03 (ref 1.005–1.030)
UROBILINOGEN UA: 0.2 mg/dL (ref 0.0–1.0)

## 2014-06-19 MED ORDER — INSULIN NPH (HUMAN) (ISOPHANE) 100 UNIT/ML ~~LOC~~ SUSP: SUBCUTANEOUS | Status: DC

## 2014-06-19 NOTE — Progress Notes (Signed)
Subjective:  Theresa Koch is a 34 y.o. G2P1001 at [redacted]w[redacted]d being seen today for ongoing prenatal care.  Patient reports no complaints.  Contractions: Not present.  Vag. Bleeding: None. Movement: Present. Denies leaking of fluid.   Endocrinologist started on insulin 4 days ago, blood sugars improved as per patient.   The following portions of the patient's history were reviewed and updated as appropriate: allergies, current medications, past family history, past medical history, past social history, past surgical history and problem list.   Objective:   Filed Vitals:   06/19/14 0923  BP: 98/62  Pulse: 71  Weight: 137 lb 12.8 oz (62.506 kg)    Fetal Status: Fetal Heart Rate (bpm): NST Fundal Height: 34 cm Movement: Present     General:  Alert, oriented and cooperative. Patient is in no acute distress.  Skin: Skin is warm and dry. No rash noted.   Cardiovascular: Normal heart rate noted  Respiratory: Effort and breath sounds normal, no problems with respiration noted  Abdomen: Soft, gravid, appropriate for gestational age. Pain/Pressure: Present     Vaginal: Vag. Bleeding: None.       Cervix: Not evaluated  Extremities: Normal range of motion.  Edema: None  Mental Status: Normal mood and affect. Normal behavior. Normal judgment and thought content.   Urinalysis: Urine Protein: 1+ Urine Glucose: Negative On review of BS since insulin start, Fasting 85, 123, 101, 108. 2hr PP B: 706,237,628.  L: 145, 80, 154.  D: 168  Assessment and Plan:  Pregnancy: G2P1001 at [redacted]w[redacted]d  1. Maternal pregestational diabetes classes B through R, antepartum - Fetal nonstress test  was reviewed and was found to be reactive.  Continue recommended antenatal testing and prenatal care.  6/2 EFW 83%, normal AFV. Continue to monitor. Patient is on Novolog 3-5 units tid. Based on pregnancy based dosing, patient requires about 56 units of insulin daily (0.9 units/kg in third trimester) which involves 1/3 NPH divided  evenly into am and pm , 2/3 Novolog (1/3 of this dose ac B/L/D) which is approximately NPH 9 units am and qhs; Novolog 12 units qac. Patient is worried aboit such a high dose because she is insulin-naive and this is also not her endocrinologist's recommendations Her endocrinologist is out of town, will not be back for one month; patient told to contact office for emergencies in the meantime She was advised to contact their office with these recommendations; in the meantime, she is amenable to trial of NPH 5 units qam and pm and keeping Novolog at 5 units qac. Hypoglycemia precautions reviewed. Discussed implications of DM in pregnancy, need for optimizing glycemic control to decrease DM associated maternal-fetal morbidity and mortality especially in the third trimester, need for antenatal testing and frequent ultrasounds/prenatal visits.  She was told she needs more frequent evaluations of her glycemic control in third trimester, at least every week. Preterm labor symptoms and general obstetric precautions including but not limited to vaginal bleeding, contractions, leaking of fluid and fetal movement were reviewed in detail with the patient. Please refer to After Visit Summary for other counseling recommendations.   Return in about 7 days (around 06/26/2014) for Ob fu and NST @ 0900.   Tereso Newcomer, MD

## 2014-06-19 NOTE — Patient Instructions (Signed)
Return to clinic for any obstetric concerns or go to [facility] for evaluation  

## 2014-06-22 ENCOUNTER — Ambulatory Visit (HOSPITAL_COMMUNITY)
Admission: RE | Admit: 2014-06-22 | Discharge: 2014-06-22 | Disposition: A | Discharge status: Home / Self Care | Payer: Medicaid Other | Source: Ambulatory Visit | Attending: Family Medicine | Admitting: Family Medicine

## 2014-06-22 ENCOUNTER — Other Ambulatory Visit (HOSPITAL_COMMUNITY): Payer: Medicaid Other

## 2014-06-22 ENCOUNTER — Encounter (HOSPITAL_COMMUNITY): Payer: Self-pay

## 2014-06-22 ENCOUNTER — Ambulatory Visit (HOSPITAL_COMMUNITY): Payer: Medicaid Other

## 2014-06-22 DIAGNOSIS — Z3A49 Greater than 42 weeks gestation of pregnancy: Secondary | ICD-10-CM | POA: Insufficient documentation

## 2014-06-22 DIAGNOSIS — O24913 Unspecified diabetes mellitus in pregnancy, third trimester: Secondary | ICD-10-CM

## 2014-06-22 DIAGNOSIS — E119 Type 2 diabetes mellitus without complications: Secondary | ICD-10-CM | POA: Insufficient documentation

## 2014-06-22 DIAGNOSIS — O3421 Maternal care for scar from previous cesarean delivery: Secondary | ICD-10-CM | POA: Diagnosis not present

## 2014-06-22 DIAGNOSIS — O24313 Unspecified pre-existing diabetes mellitus in pregnancy, third trimester: Secondary | ICD-10-CM | POA: Insufficient documentation

## 2014-06-22 DIAGNOSIS — Z3A32 32 weeks gestation of pregnancy: Secondary | ICD-10-CM

## 2014-06-22 DIAGNOSIS — O34219 Maternal care for unspecified type scar from previous cesarean delivery: Secondary | ICD-10-CM

## 2014-06-23 ENCOUNTER — Encounter: Payer: Self-pay | Admitting: *Deleted

## 2014-06-26 ENCOUNTER — Ambulatory Visit (INDEPENDENT_AMBULATORY_CARE_PROVIDER_SITE_OTHER): Payer: Medicaid Other | Admitting: Obstetrics and Gynecology

## 2014-06-26 ENCOUNTER — Other Ambulatory Visit (HOSPITAL_COMMUNITY): Payer: Medicaid Other

## 2014-06-26 VITALS — BP 107/63 | HR 86 | Wt 141.3 lb

## 2014-06-26 DIAGNOSIS — O24319 Unspecified pre-existing diabetes mellitus in pregnancy, unspecified trimester: Secondary | ICD-10-CM | POA: Diagnosis not present

## 2014-06-26 DIAGNOSIS — E119 Type 2 diabetes mellitus without complications: Secondary | ICD-10-CM

## 2014-06-26 LAB — POCT URINALYSIS DIP (DEVICE)
BILIRUBIN URINE: NEGATIVE
GLUCOSE, UA: 500 mg/dL — AB
Hgb urine dipstick: NEGATIVE
Ketones, ur: NEGATIVE mg/dL
LEUKOCYTES UA: NEGATIVE
Nitrite: NEGATIVE
PH: 6 (ref 5.0–8.0)
Protein, ur: NEGATIVE mg/dL
Specific Gravity, Urine: 1.02 (ref 1.005–1.030)
Urobilinogen, UA: 0.2 mg/dL (ref 0.0–1.0)

## 2014-06-26 MED ORDER — INSULIN SYRINGES (DISPOSABLE) U-100 1 ML MISC
Status: DC
Start: 2014-06-26 — End: 2014-07-03

## 2014-06-26 NOTE — Progress Notes (Signed)
Subjective:  Theresa Koch is a 34 y.o. G2P1001 at [redacted]w[redacted]d being seen today for ongoing prenatal care.  Patient reports no complaints.  Contractions: Not present.  Vag. Bleeding: None. Movement: Present. Denies leaking of fluid.   She reports she has been given additional insulin - humalin and novolin. She feels like it's working better than glipizide.  Next appointment is on July 5th.  AM sugars: < 100 Aftermeals: 70s - 120s (highest 140, 150)  [place] scheduled for Thursday  The following portions of the patient's history were reviewed and updated as appropriate: allergies, current medications, past family history, past medical history, past social history, past surgical history and problem list.   Objective:   Filed Vitals:   06/26/14 0928  BP: 107/63  Pulse: 86  Weight: 64.093 kg (141 lb 4.8 oz)    Fetal Status: Fetal Heart Rate (bpm): 150 Fundal Height: 33 cm Movement: Present     General:  Alert, oriented and cooperative. Patient is in no acute distress.  Skin: Skin is warm and dry. No rash noted.   Cardiovascular: Normal heart rate noted  Respiratory: Effort and breath sounds normal, no problems with respiration noted  Abdomen: Soft, gravid, appropriate for gestational age. Pain/Pressure: Present     Vaginal: Vag. Bleeding: None.       Cervix: Not evaluated       Extremities: Normal range of motion.  Edema: None  Mental Status: Normal mood and affect. Normal behavior. Normal judgment and thought content.   Urinalysis: Urine Protein: Negative Urine Glucose: 2+  Assessment and Plan:  Pregnancy: G2P1001 at [redacted]w[redacted]d  1. Maternal pregestational diabetes classes B through R, antepartum Growth [place] ordered Followed by endocrine, but reports improved glucose control since starting insulin - Fetal nonstress test - reviewed and WNL   Preterm labor symptoms and general obstetric precautions including but not limited to vaginal bleeding, contractions, leaking of fluid and fetal movement were  reviewed in detail with the patient.  Please refer to After Visit Summary for other counseling recommendations.   Return in about 7 days (around 07/03/2014) for OB f/u & NST.   Ethelda Chick, MD

## 2014-06-26 NOTE — Progress Notes (Signed)
Patient reports headache a couple days ago but denies blurry vision or dizziness Reviewed tip of week with patient

## 2014-06-29 ENCOUNTER — Ambulatory Visit (HOSPITAL_COMMUNITY)
Admission: RE | Admit: 2014-06-29 | Discharge: 2014-06-29 | Disposition: A | Discharge status: Home / Self Care | Payer: Medicaid Other | Source: Ambulatory Visit | Attending: Family Medicine | Admitting: Family Medicine

## 2014-06-29 ENCOUNTER — Ambulatory Visit (HOSPITAL_COMMUNITY): Payer: Medicaid Other

## 2014-06-29 ENCOUNTER — Other Ambulatory Visit (HOSPITAL_COMMUNITY): Payer: Medicaid Other

## 2014-06-29 ENCOUNTER — Other Ambulatory Visit: Payer: Self-pay | Admitting: Obstetrics & Gynecology

## 2014-06-29 VITALS — BP 109/67 | HR 77 | Wt 143.1 lb

## 2014-06-29 DIAGNOSIS — O283 Abnormal ultrasonic finding on antenatal screening of mother: Secondary | ICD-10-CM | POA: Diagnosis not present

## 2014-06-29 DIAGNOSIS — E119 Type 2 diabetes mellitus without complications: Secondary | ICD-10-CM | POA: Diagnosis not present

## 2014-06-29 DIAGNOSIS — O24319 Unspecified pre-existing diabetes mellitus in pregnancy, unspecified trimester: Secondary | ICD-10-CM

## 2014-06-29 DIAGNOSIS — O34219 Maternal care for unspecified type scar from previous cesarean delivery: Secondary | ICD-10-CM

## 2014-06-29 DIAGNOSIS — O24313 Unspecified pre-existing diabetes mellitus in pregnancy, third trimester: Secondary | ICD-10-CM | POA: Insufficient documentation

## 2014-06-29 DIAGNOSIS — Z3A33 33 weeks gestation of pregnancy: Secondary | ICD-10-CM | POA: Diagnosis not present

## 2014-06-29 DIAGNOSIS — Z3A32 32 weeks gestation of pregnancy: Secondary | ICD-10-CM

## 2014-06-29 DIAGNOSIS — O24913 Unspecified diabetes mellitus in pregnancy, third trimester: Secondary | ICD-10-CM | POA: Insufficient documentation

## 2014-06-29 DIAGNOSIS — O3421 Maternal care for scar from previous cesarean delivery: Secondary | ICD-10-CM | POA: Diagnosis not present

## 2014-06-29 DIAGNOSIS — O0993 Supervision of high risk pregnancy, unspecified, third trimester: Secondary | ICD-10-CM

## 2014-06-29 NOTE — Addendum Note (Signed)
Encounter addended by: Cammy Copa, RN on: 06/29/2014 11:08 AM<BR>     Documentation filed: Normajean Glasgow VN

## 2014-07-03 ENCOUNTER — Ambulatory Visit (INDEPENDENT_AMBULATORY_CARE_PROVIDER_SITE_OTHER): Payer: Medicaid Other | Admitting: Family Medicine

## 2014-07-03 ENCOUNTER — Other Ambulatory Visit (HOSPITAL_COMMUNITY): Payer: Medicaid Other

## 2014-07-03 ENCOUNTER — Other Ambulatory Visit: Payer: Self-pay | Admitting: Obstetrics & Gynecology

## 2014-07-03 VITALS — BP 102/67 | HR 80 | Wt 142.6 lb

## 2014-07-03 DIAGNOSIS — O0993 Supervision of high risk pregnancy, unspecified, third trimester: Secondary | ICD-10-CM | POA: Diagnosis not present

## 2014-07-03 DIAGNOSIS — O3421 Maternal care for scar from previous cesarean delivery: Secondary | ICD-10-CM | POA: Diagnosis not present

## 2014-07-03 DIAGNOSIS — O24319 Unspecified pre-existing diabetes mellitus in pregnancy, unspecified trimester: Secondary | ICD-10-CM | POA: Diagnosis not present

## 2014-07-03 DIAGNOSIS — O24913 Unspecified diabetes mellitus in pregnancy, third trimester: Secondary | ICD-10-CM

## 2014-07-03 DIAGNOSIS — O34219 Maternal care for unspecified type scar from previous cesarean delivery: Secondary | ICD-10-CM

## 2014-07-03 DIAGNOSIS — E119 Type 2 diabetes mellitus without complications: Secondary | ICD-10-CM

## 2014-07-03 DIAGNOSIS — Z3A32 32 weeks gestation of pregnancy: Secondary | ICD-10-CM

## 2014-07-03 LAB — POCT URINALYSIS DIP (DEVICE)
BILIRUBIN URINE: NEGATIVE
GLUCOSE, UA: 100 mg/dL — AB
HGB URINE DIPSTICK: NEGATIVE
KETONES UR: NEGATIVE mg/dL
LEUKOCYTES UA: NEGATIVE
Nitrite: NEGATIVE
Protein, ur: NEGATIVE mg/dL
Specific Gravity, Urine: 1.015 (ref 1.005–1.030)
Urobilinogen, UA: 0.2 mg/dL (ref 0.0–1.0)
pH: 6.5 (ref 5.0–8.0)

## 2014-07-03 MED ORDER — "INSULIN SYRINGE 31G X 5/16"" 0.5 ML MISC": 1.0000 | Freq: Four times a day (QID) | Status: DC | PRN

## 2014-07-03 MED ORDER — INSULIN NPH (HUMAN) (ISOPHANE) 100 UNIT/ML ~~LOC~~ SUSP: SUBCUTANEOUS | Status: DC

## 2014-07-03 MED ORDER — INSULIN ASPART 100 UNIT/ML FLEXPEN: 8.0000 [IU] | PEN_INJECTOR | Freq: Three times a day (TID) | SUBCUTANEOUS | Status: DC

## 2014-07-03 NOTE — Patient Instructions (Signed)
Third Trimester of Pregnancy The third trimester is from week 29 through week 42, months 7 through 9. The third trimester is a time when the fetus is growing rapidly. At the end of the ninth month, the fetus is about 20 inches in length and weighs 6-10 pounds.  BODY CHANGES Your body goes through many changes during pregnancy. The changes vary from woman to woman.   Your weight will continue to increase. You can expect to gain 25-35 pounds (11-16 kg) by the end of the pregnancy.  You may begin to get stretch marks on your hips, abdomen, and breasts.  You may urinate more often because the fetus is moving lower into your pelvis and pressing on your bladder.  You may develop or continue to have heartburn as a result of your pregnancy.  You may develop constipation because certain hormones are causing the muscles that push waste through your intestines to slow down.  You may develop hemorrhoids or swollen, bulging veins (varicose veins).  You may have pelvic pain because of the weight gain and pregnancy hormones relaxing your joints between the bones in your pelvis. Backaches may result from overexertion of the muscles supporting your posture.  You may have changes in your hair. These can include thickening of your hair, rapid growth, and changes in texture. Some women also have hair loss during or after pregnancy, or hair that feels dry or thin. Your hair will most likely return to normal after your baby is born.  Your breasts will continue to grow and be tender. A yellow discharge may leak from your breasts called colostrum.  Your belly button may stick out.  You may feel short of breath because of your expanding uterus.  You may notice the fetus "dropping," or moving lower in your abdomen.  You may have a bloody mucus discharge. This usually occurs a few days to a week before labor begins.  Your cervix becomes thin and soft (effaced) near your due date. WHAT TO EXPECT AT YOUR  PRENATAL EXAMS  You will have prenatal exams every 2 weeks until week 36. Then, you will have weekly prenatal exams. During a routine prenatal visit:  You will be weighed to make sure you and the fetus are growing normally.  Your blood pressure is taken.  Your abdomen will be measured to track your baby's growth.  The fetal heartbeat will be listened to.  Any test results from the previous visit will be discussed.  You may have a cervical check near your due date to see if you have effaced. At around 36 weeks, your caregiver will check your cervix. At the same time, your caregiver will also perform a test on the secretions of the vaginal tissue. This test is to determine if a type of bacteria, Group B streptococcus, is present. Your caregiver will explain this further. Your caregiver may ask you:  What your birth plan is.  How you are feeling.  If you are feeling the baby move.  If you have had any abnormal symptoms, such as leaking fluid, bleeding, severe headaches, or abdominal cramping.  If you have any questions. Other tests or screenings that may be performed during your third trimester include:  Blood tests that check for low iron levels (anemia).  Fetal testing to check the health, activity level, and growth of the fetus. Testing is done if you have certain medical conditions or if there are problems during the pregnancy. FALSE LABOR You may feel small, irregular contractions that   eventually go away. These are called Braxton Hicks contractions, or false labor. Contractions may last for hours, days, or even weeks before true labor sets in. If contractions come at regular intervals, intensify, or become painful, it is best to be seen by your caregiver.  SIGNS OF LABOR   Menstrual-like cramps.  Contractions that are 5 minutes apart or less.  Contractions that start on the top of the uterus and spread down to the lower abdomen and back.  A sense of increased pelvic  pressure or back pain.  A watery or bloody mucus discharge that comes from the vagina. If you have any of these signs before the 37th week of pregnancy, call your caregiver right away. You need to go to the hospital to get checked immediately. HOME CARE INSTRUCTIONS   Avoid all smoking, herbs, alcohol, and unprescribed drugs. These chemicals affect the formation and growth of the baby.  Follow your caregiver's instructions regarding medicine use. There are medicines that are either safe or unsafe to take during pregnancy.  Exercise only as directed by your caregiver. Experiencing uterine cramps is a good sign to stop exercising.  Continue to eat regular, healthy meals.  Wear a good support bra for breast tenderness.  Do not use hot tubs, steam rooms, or saunas.  Wear your seat belt at all times when driving.  Avoid raw meat, uncooked cheese, cat litter boxes, and soil used by cats. These carry germs that can cause birth defects in the baby.  Take your prenatal vitamins.  Try taking a stool softener (if your caregiver approves) if you develop constipation. Eat more high-fiber foods, such as fresh vegetables or fruit and whole grains. Drink plenty of fluids to keep your urine clear or pale yellow.  Take warm sitz baths to soothe any pain or discomfort caused by hemorrhoids. Use hemorrhoid cream if your caregiver approves.  If you develop varicose veins, wear support hose. Elevate your feet for 15 minutes, 3-4 times a day. Limit salt in your diet.  Avoid heavy lifting, wear low heal shoes, and practice good posture.  Rest a lot with your legs elevated if you have leg cramps or low back pain.  Visit your dentist if you have not gone during your pregnancy. Use a soft toothbrush to brush your teeth and be gentle when you floss.  A sexual relationship may be continued unless your caregiver directs you otherwise.  Do not travel far distances unless it is absolutely necessary and only  with the approval of your caregiver.  Take prenatal classes to understand, practice, and ask questions about the labor and delivery.  Make a trial run to the hospital.  Pack your hospital bag.  Prepare the baby's nursery.  Continue to go to all your prenatal visits as directed by your caregiver. SEEK MEDICAL CARE IF:  You are unsure if you are in labor or if your water has broken.  You have dizziness.  You have mild pelvic cramps, pelvic pressure, or nagging pain in your abdominal area.  You have persistent nausea, vomiting, or diarrhea.  You have a bad smelling vaginal discharge.  You have pain with urination. SEEK IMMEDIATE MEDICAL CARE IF:   You have a fever.  You are leaking fluid from your vagina.  You have spotting or bleeding from your vagina.  You have severe abdominal cramping or pain.  You have rapid weight loss or gain.  You have shortness of breath with chest pain.  You notice sudden or extreme swelling   of your face, hands, ankles, feet, or legs.  You have not felt your baby move in over an hour.  You have severe headaches that do not go away with medicine.  You have vision changes. Document Released: 12/17/2000 Document Revised: 12/28/2012 Document Reviewed: 02/24/2012 [employer] Patient Information 2015 [employer]. This information is not intended to replace advice given to you by your health care provider. Make sure you discuss any questions you have with your health care provider.  Breastfeeding Deciding to breastfeed is one of the best choices you can make for you and your baby. A change in hormones during pregnancy causes your breast tissue to grow and increases the number and size of your milk ducts. These hormones also allow proteins, sugars, and fats from your blood supply to make breast milk in your milk-producing glands. Hormones prevent breast milk from being released before your baby is born as well as prompt milk flow after birth. Once  breastfeeding has begun, thoughts of your baby, as well as his or her sucking or crying, can stimulate the release of milk from your milk-producing glands.  BENEFITS OF BREASTFEEDING For Your Baby  Your first milk (colostrum) helps your baby's digestive system function better.   There are antibodies in your milk that help your baby fight off infections.   Your baby has a lower incidence of asthma, allergies, and sudden infant death syndrome.   The nutrients in breast milk are better for your baby than infant formulas and are designed uniquely for your baby's needs.   Breast milk improves your baby's brain development.   Your baby is less likely to develop other conditions, such as childhood obesity, asthma, or type 2 diabetes mellitus.  For You   Breastfeeding helps to create a very special bond between you and your baby.   Breastfeeding is convenient. Breast milk is always available at the correct temperature and costs nothing.   Breastfeeding helps to burn calories and helps you lose the weight gained during pregnancy.   Breastfeeding makes your uterus contract to its prepregnancy size faster and slows bleeding (lochia) after you give birth.   Breastfeeding helps to lower your risk of developing type 2 diabetes mellitus, osteoporosis, and breast or ovarian cancer later in life. SIGNS THAT YOUR BABY IS HUNGRY Early Signs of Hunger  Increased alertness or activity.  Stretching.  Movement of the head from side to side.  Movement of the head and opening of the mouth when the corner of the mouth or cheek is stroked (rooting).  Increased sucking sounds, smacking lips, cooing, sighing, or squeaking.  Hand-to-mouth movements.  Increased sucking of fingers or hands. Late Signs of Hunger  Fussing.  Intermittent crying. Extreme Signs of Hunger Signs of extreme hunger will require calming and consoling before your baby will be able to breastfeed successfully. Do not  wait for the following signs of extreme hunger to occur before you initiate breastfeeding:   Restlessness.  A loud, strong cry.   Screaming. BREASTFEEDING BASICS Breastfeeding Initiation  Find a comfortable place to sit or lie down, with your neck and back well supported.  Place a pillow or rolled up blanket under your baby to bring him or her to the level of your breast (if you are seated). Nursing pillows are specially designed to help support your arms and your baby while you breastfeed.  Make sure that your baby's abdomen is facing your abdomen.   Gently massage your breast. With your fingertips, massage from your chest   wall toward your nipple in a circular motion. This encourages milk flow. You may need to continue this action during the feeding if your milk flows slowly.  Support your breast with 4 fingers underneath and your thumb above your nipple. Make sure your fingers are well away from your nipple and your baby's mouth.   Stroke your baby's lips gently with your finger or nipple.   When your baby's mouth is open wide enough, quickly bring your baby to your breast, placing your entire nipple and as much of the colored area around your nipple (areola) as possible into your baby's mouth.   More areola should be visible above your baby's upper lip than below the lower lip.   Your baby's tongue should be between his or her lower gum and your breast.   Ensure that your baby's mouth is correctly positioned around your nipple (latched). Your baby's lips should create a seal on your breast and be turned out (everted).  It is common for your baby to suck about 2-3 minutes in order to start the flow of breast milk. Latching Teaching your baby how to latch on to your breast properly is very important. An improper latch can cause nipple pain and decreased milk supply for you and poor weight gain in your baby. Also, if your baby is not latched onto your nipple properly, he or she  may swallow some air during feeding. This can make your baby fussy. Burping your baby when you switch breasts during the feeding can help to get rid of the air. However, teaching your baby to latch on properly is still the best way to prevent fussiness from swallowing air while breastfeeding. Signs that your baby has successfully latched on to your nipple:    Silent tugging or silent sucking, without causing you pain.   Swallowing heard between every 3-4 sucks.    Muscle movement above and in front of his or her ears while sucking.  Signs that your baby has not successfully latched on to nipple:   Sucking sounds or smacking sounds from your baby while breastfeeding.  Nipple pain. If you think your baby has not latched on correctly, slip your finger into the corner of your baby's mouth to break the suction and place it between your baby's gums. Attempt breastfeeding initiation again. Signs of Successful Breastfeeding Signs from your baby:   A gradual decrease in the number of sucks or complete cessation of sucking.   Falling asleep.   Relaxation of his or her body.   Retention of a small amount of milk in his or her mouth.   Letting go of your breast by himself or herself. Signs from you:  Breasts that have increased in firmness, weight, and size 1-3 hours after feeding.   Breasts that are softer immediately after breastfeeding.  Increased milk volume, as well as a change in milk consistency and color by the fifth day of breastfeeding.   Nipples that are not sore, cracked, or bleeding. Signs That Your Baby is Getting Enough Milk  Wetting at least 3 diapers in a 24-hour period. The urine should be clear and pale yellow by age 5 days.  At least 3 stools in a 24-hour period by age 5 days. The stool should be soft and yellow.  At least 3 stools in a 24-hour period by age 7 days. The stool should be seedy and yellow.  No loss of weight greater than 10% of birth weight  during the first 3   days of age.  Average weight gain of 4-7 ounces (113-198 g) per week after age 4 days.  Consistent daily weight gain by age 5 days, without weight loss after the age of 2 weeks. After a feeding, your baby may spit up a small amount. This is common. BREASTFEEDING FREQUENCY AND DURATION Frequent feeding will help you make more milk and can prevent sore nipples and breast engorgement. Breastfeed when you feel the need to reduce the fullness of your breasts or when your baby shows signs of hunger. This is called "breastfeeding on demand." Avoid introducing a pacifier to your baby while you are working to establish breastfeeding (the first 4-6 weeks after your baby is born). After this time you may choose to use a pacifier. Research has shown that pacifier use during the first year of a baby's life decreases the risk of sudden infant death syndrome (SIDS). Allow your baby to feed on each breast as long as he or she wants. Breastfeed until your baby is finished feeding. When your baby unlatches or falls asleep while feeding from the first breast, offer the second breast. Because newborns are often sleepy in the first few weeks of life, you may need to awaken your baby to get him or her to feed. Breastfeeding times will vary from baby to baby. However, the following rules can serve as a guide to help you ensure that your baby is properly fed:  Newborns (babies 4 weeks of age or younger) may breastfeed every 1-3 hours.  Newborns should not go longer than 3 hours during the day or 5 hours during the night without breastfeeding.  You should breastfeed your baby a minimum of 8 times in a 24-hour period until you begin to introduce solid foods to your baby at around 6 months of age. BREAST MILK PUMPING Pumping and storing breast milk allows you to ensure that your baby is exclusively fed your breast milk, even at times when you are unable to breastfeed. This is especially important if you are  going back to work while you are still breastfeeding or when you are not able to be present during feedings. Your lactation consultant can give you guidelines on how long it is safe to store breast milk.  A breast pump is a machine that allows you to pump milk from your breast into a sterile bottle. The pumped breast milk can then be stored in a refrigerator or freezer. Some breast pumps are operated by hand, while others use electricity. Ask your lactation consultant which type will work best for you. Breast pumps can be purchased, but some hospitals and breastfeeding support groups lease breast pumps on a monthly basis. A lactation consultant can teach you how to hand express breast milk, if you prefer not to use a pump.  CARING FOR YOUR BREASTS WHILE YOU BREASTFEED Nipples can become dry, cracked, and sore while breastfeeding. The following recommendations can help keep your breasts moisturized and healthy:  Avoid using soap on your nipples.   Wear a supportive bra. Although not required, special nursing bras and tank tops are designed to allow access to your breasts for breastfeeding without taking off your entire bra or top. Avoid wearing underwire-style bras or extremely tight bras.  Air dry your nipples for 3-4minutes after each feeding.   Use only cotton bra pads to absorb leaked breast milk. Leaking of breast milk between feedings is normal.   Use lanolin on your nipples after breastfeeding. Lanolin helps to maintain your skin's   normal moisture barrier. If you use pure lanolin, you do not need to wash it off before feeding your baby again. Pure lanolin is not toxic to your baby. You may also hand express a few drops of breast milk and gently massage that milk into your nipples and allow the milk to air dry. In the first few weeks after giving birth, some women experience extremely full breasts (engorgement). Engorgement can make your breasts feel heavy, warm, and tender to the touch.  Engorgement peaks within 3-5 days after you give birth. The following recommendations can help ease engorgement:  Completely empty your breasts while breastfeeding or pumping. You may want to start by applying warm, moist heat (in the shower or with warm water-soaked hand towels) just before feeding or pumping. This increases circulation and helps the milk flow. If your baby does not completely empty your breasts while breastfeeding, pump any extra milk after he or she is finished.  Wear a snug bra (nursing or regular) or tank top for 1-2 days to signal your body to slightly decrease milk production.  Apply ice packs to your breasts, unless this is too uncomfortable for you.  Make sure that your baby is latched on and positioned properly while breastfeeding. If engorgement persists after 48 hours of following these recommendations, contact your health care provider or a lactation consultant. OVERALL HEALTH CARE RECOMMENDATIONS WHILE BREASTFEEDING  Eat healthy foods. Alternate between meals and snacks, eating 3 of each per day. Because what you eat affects your breast milk, some of the foods may make your baby more irritable than usual. Avoid eating these foods if you are sure that they are negatively affecting your baby.  Drink milk, fruit juice, and water to satisfy your thirst (about 10 glasses a day).   Rest often, relax, and continue to take your prenatal vitamins to prevent fatigue, stress, and anemia.  Continue breast self-awareness checks.  Avoid chewing and smoking tobacco.  Avoid alcohol and drug use. Some medicines that may be harmful to your baby can pass through breast milk. It is important to ask your health care provider before taking any medicine, including all over-the-counter and prescription medicine as well as vitamin and herbal supplements. It is possible to become pregnant while breastfeeding. If birth control is desired, ask your health care provider about options that  will be safe for your baby. SEEK MEDICAL CARE IF:   You feel like you want to stop breastfeeding or have become frustrated with breastfeeding.  You have painful breasts or nipples.  Your nipples are cracked or bleeding.  Your breasts are red, tender, or warm.  You have a swollen area on either breast.  You have a fever or chills.  You have nausea or vomiting.  You have drainage other than breast milk from your nipples.  Your breasts do not become full before feedings by the fifth day after you give birth.  You feel sad and depressed.  Your baby is too sleepy to eat well.  Your baby is having trouble sleeping.   Your baby is wetting less than 3 diapers in a 24-hour period.  Your baby has less than 3 stools in a 24-hour period.  Your baby's skin or the white part of his or her eyes becomes yellow.   Your baby is not gaining weight by 5 days of age. SEEK IMMEDIATE MEDICAL CARE IF:   Your baby is overly tired (lethargic) and does not want to wake up and feed.  Your baby   develops an unexplained fever. Document Released: 12/23/2004 Document Revised: 12/28/2012 Document Reviewed: 06/16/2012 [employer] Patient Information 2015 [employer]. This information is not intended to replace advice given to you by your health care provider. Make sure you discuss any questions you have with your health care provider.  

## 2014-07-03 NOTE — Progress Notes (Signed)
Nutrition note: f/u re: GDM diet Pt has gained 10.6# @ [redacted]w[redacted]d, which is < expected but rate of gain is wnl. Pt reports eating 2-3 meals & 1-2 snacks/d (eats tea & cookies ~7:30/8am, snack ~10/11am, lunch 2/3pm, dinner 9-10pm, and then sometimes milk & cookies if BS after dinner are lower). Pt's BS have been- fasting: 83-142, 2hr pp: 44-242 Pt reports that she is not fasting for ramadan but her eating habits have been different due to her husband fasting. Pt stated that when she woke up during the night & had a BS of 34, she treated with a small meal. Reviewed GDM diet & encouraged pt to try to eat at usual times even during ramadan to help control her BS. Discussed proper way to treat low BS (<60)- pt stated sweet tarts sounded like an ok option. Discussed wt gain goals of 25-35# or 1#/wk. Pt agreeable to make changes as discussed. F/u as needed Blondell Reveal, MS, RD, LDN, [facility]

## 2014-07-03 NOTE — Progress Notes (Signed)
Subjective:  Theresa Koch is a 34 y.o. G2P1001 at [redacted]w[redacted]d being seen today for ongoing prenatal care.  Patient reports no complaints.  Contractions: Not present.  Vag. Bleeding: None. Movement: Present. Denies leaking of fluid.   The following portions of the patient's history were reviewed and updated as appropriate: allergies, current medications, past family history, past medical history, past social history, past surgical history and problem list.   Objective:   Filed Vitals:   07/03/14 1004  BP: 102/67  Pulse: 80  Weight: 142 lb 9.6 oz (64.683 kg)    Fetal Status: Fetal Heart Rate (bpm): NST   Movement: Present     General:  Alert, oriented and cooperative. Patient is in no acute distress.  Skin: Skin is warm and dry. No rash noted.   Cardiovascular: Normal heart rate noted  Respiratory: Normal respiratory effort, no problems with respiration noted  Abdomen: Soft, gravid, appropriate for gestational age. Pain/Pressure: Present     Vaginal: Vag. Bleeding: None.       Extremities: Normal range of motion.     Mental Status: Normal mood and affect. Normal behavior. Normal judgment and thought content.   Urinalysis: Urine Protein: Negative Urine Glucose: 2+  Assessment and Plan:  Pregnancy: G2P1001 at [redacted]w[redacted]d  1. Diabetes mellitus affecting pregnancy in third trimester, antepartum Insulin adjusted and f/u with nutrition to help with highs and lows. - Fetal nonstress test - US Fetal BPP W/O Non Stress; Future - insulin aspart (NOVOLOG FLEXPEN) 100 UNIT/ML FlexPen; Inject 8 Units into the skin 3 (three) times daily with meals.  Dispense: 15 mL; Refill: 2 - Insulin Syringe-Needle U-100 (INSULIN SYRINGE .5CC/31GX5/16") 31G X 5/16" 0.5 ML MISC; 1 Syringe by Does not apply route 4 (four) times daily as needed.  Dispense: 100 each; Refill: 3  2. Maternal pregestational diabetes classes B through R, antepartum  - insulin NPH Human (HUMULIN N,NOVOLIN N) 100 UNIT/ML injection; Take 4 units  twice a day with breakfast and at bedtime  Dispense: 10 mL; Refill: 3  3. Supervision of high risk pregnancy, antepartum, third trimester Continue standard PNC--cultures next visit  4. Previous cesarean section complicating pregnancy, antepartum condition or complication For TOLAC if baby is not too big.   Preterm labor symptoms and general obstetric precautions including but not limited to vaginal bleeding, contractions, leaking of fluid and fetal movement were reviewed in detail with the patient.  Please refer to After Visit Summary for other counseling recommendations.   Return in about 10 days (around 07/13/2014) for as scheduled.   Reva Bores, MD

## 2014-07-03 NOTE — Progress Notes (Signed)
Pt reports pain while walking @ umbilicus and above - states "It is not contractions, it is pain."    US for growth/BPP on 6/30

## 2014-07-06 ENCOUNTER — Encounter (HOSPITAL_COMMUNITY): Payer: Self-pay

## 2014-07-06 ENCOUNTER — Ambulatory Visit (HOSPITAL_COMMUNITY): Payer: Medicaid Other

## 2014-07-06 ENCOUNTER — Ambulatory Visit (HOSPITAL_COMMUNITY)
Admission: RE | Admit: 2014-07-06 | Discharge: 2014-07-06 | Disposition: A | Discharge status: Home / Self Care | Payer: Medicaid Other | Source: Ambulatory Visit | Attending: Family Medicine | Admitting: Family Medicine

## 2014-07-06 VITALS — BP 105/71 | HR 70 | Wt 143.5 lb

## 2014-07-06 DIAGNOSIS — O24913 Unspecified diabetes mellitus in pregnancy, third trimester: Secondary | ICD-10-CM | POA: Diagnosis not present

## 2014-07-06 DIAGNOSIS — Z3A32 32 weeks gestation of pregnancy: Secondary | ICD-10-CM | POA: Diagnosis not present

## 2014-07-06 DIAGNOSIS — O34219 Maternal care for unspecified type scar from previous cesarean delivery: Secondary | ICD-10-CM

## 2014-07-06 DIAGNOSIS — O3421 Maternal care for scar from previous cesarean delivery: Secondary | ICD-10-CM | POA: Insufficient documentation

## 2014-07-06 DIAGNOSIS — Z3A34 34 weeks gestation of pregnancy: Secondary | ICD-10-CM | POA: Insufficient documentation

## 2014-07-06 DIAGNOSIS — O24319 Unspecified pre-existing diabetes mellitus in pregnancy, unspecified trimester: Secondary | ICD-10-CM

## 2014-07-11 ENCOUNTER — Ambulatory Visit (INDEPENDENT_AMBULATORY_CARE_PROVIDER_SITE_OTHER): Payer: Medicaid Other | Admitting: Internal Medicine

## 2014-07-11 ENCOUNTER — Encounter: Payer: Self-pay | Admitting: Internal Medicine

## 2014-07-11 VITALS — BP 108/74 | HR 94 | Temp 98.4°F | Resp 18 | Wt 146.0 lb

## 2014-07-11 DIAGNOSIS — E119 Type 2 diabetes mellitus without complications: Secondary | ICD-10-CM

## 2014-07-11 DIAGNOSIS — O24319 Unspecified pre-existing diabetes mellitus in pregnancy, unspecified trimester: Secondary | ICD-10-CM

## 2014-07-11 MED ORDER — INSULIN PEN NEEDLE 32G X 4 MM MISC: Status: DC

## 2014-07-11 MED ORDER — INSULIN NPH (HUMAN) (ISOPHANE) 100 UNIT/ML ~~LOC~~ SUSP: SUBCUTANEOUS | Status: DC

## 2014-07-11 NOTE — Progress Notes (Signed)
Patient ID: Theresa Koch, female   DOB: [birth year], 34 y.o.   MRN: [id number]  HPI: Theresa Koch is a 34 y.o.-year-old female, initially referred by Dr. Synetta Shadow, returning for f/u for DM2, dx in 2011, insulin-dependent since end 2013, uncontrolled, without complications. She is [redacted] weeks pregnant (will have a girl). She also has a 2 y/o daughter. Last visit 1 mo ago.  Last hemoglobin A1c was: Lab Results  Component Value Date   HGBA1C 6.2 05/12/2014   HGBA1C 7.6* 02/14/2014   HGBA1C 8.8 12/19/2013   Pt was on a regimen of: - Metformin XR 1500 mg po after dinner - NPH-R 70/30 8 units in am  She was then on: - Metformin XR 500 mg po after each meal  - Glipizide XL 5 mg before b'fast She was on Lantus >> tapered off 2/2 low CBGs.  At last visit, we changed to: - Novolog 8 units tid ac - NPH 4 units bid (last dose at bedtime) - Metformin XR 500 mg tid pc  Pt checks her sugars 3x a day and they are: - am: 78-126 >> 58-80 >> 59, 75-95, 113 >> 73-117, 128 >> 95-140, 152 >> 69-107, 121 - 2h after b'fast: 92, 94 >> 96-164, 184 >> 78-142, 188, 217 >> 72-157, 209 >> 110-211, 264 >> 71-174 - before lunch: n/c >> 53-116 >>  59, 66-113 >> 64-102 >> 74-140 >> 54 (delayed lunch)-118, 127 - 2h after lunch: 139-176 >> 97-169, 221 >> 65, 86, 105-192 >> 84-134 >> 107-228, 267 >> 70-112 - before dinner: has lunch late >> 72-89 >> 76, 86-135, 153, 180 >> 78-139, 163 >> 71-171 >> 57-104, 183 - 2h after dinner: 134-179 >> 93-159, 185 >> 109-167, 182 >> 133-266 >> 63-160, 189 - bedtime: n/c >> 148 >> 185 >> 50-134 - nighttime: 56, 70-90 >> 79, 91 >> n/c >> 62-134, 172 No lows. Lowest sugar was 46 >> 53 >> 59 >> 64 >> 71 >> 34 (6 units of NPH); she has hypoglycemia awareness at 60.  Highest sugar was 221 >> 217x1 >> 209 >> 238x1  Glucometer: One Touch Ultra  Pt's meals are: - Breakfast: tea with milk (nausea)  - Lunch: salad, soup, cheese - takes 70/30 before this - Dinner: egg, cheese,  sometimes cereals - Snacks:   - no CKD, no BUN/creatinine: Lab Results  Component Value Date   BUN 8 01/23/2014   Lab Results  Component Value Date   CREATININE 0.44* 01/23/2014   - no h/o HL No results found for: CHOL, HDL, LDLCALC, LDLDIRECT, TRIG, CHOLHDL  - last eye exam was on 03/20/2014. No DR.  - no numbness and tingling in her feet.  ROS: Constitutional: no weight gain/loss, no fatigue, no subjective hyperthermia, no nocturia  Eyes: no blurry vision, no xerophthalmia ENT: no sore throat, no nodules palpated in throat, no dysphagia/odynophagia, no hoarseness Cardiovascular: no CP/SOB/palpitations/leg swelling Respiratory: no cough/SOB Gastrointestinal: no N/V/D/C Musculoskeletal: no muscle/joint aches Skin: no rashes Neurological: no tremors/numbness/tingling/dizziness  I reviewed pt's medications, allergies, PMH, social hx, family hx, and changes were documented in the history of present illness. Otherwise, unchanged from my initial visit note:  Past Medical History  Diagnosis Date  . Diabetes mellitus without complication    Past Surgical History  Procedure Laterality Date  . Cesarean section     History   Social History  . Marital Status: Married    Spouse Name: N/A    Number of Children: 1   Occupational History  .  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 Smoking status: Never Smoker   . Smokeless tobacco: Not on file  . Alcohol Use: No  . Drug Use: No   Current Outpatient Prescriptions on File Prior to Visit  Medication Sig Dispense Refill  . ACCU-CHEK SOFTCLIX LANCETS lancets Use to test blood sugar 6 times daily as instructed. Dx code: O24.912 200 each 3  . aspirin EC 81 MG tablet Take 1 tablet (81 mg total) by mouth daily. Take after 12 weeks for prevention of preeclampssia later in pregnancy 300 tablet 2  . Blood Glucose Monitoring Suppl (ACCU-CHEK AVIVA PLUS) W/DEVICE KIT Use to test blood sugar 6 times  daily as instructed. Dx code: L37.342 1 kit 0  . folic acid (FOLVITE) 1 MG tablet Take 1 tablet (1 mg total) by mouth daily. 100 tablet 12  . glucose blood (ACCU-CHEK AVIVA PLUS) test strip Use to test blood sugar 6 times daily as instructed. Dx code: A76.811 200 each 3  . insulin aspart (NOVOLOG FLEXPEN) 100 UNIT/ML FlexPen Inject 8 Units into the skin 3 (three) times daily with meals. 15 mL 2  . insulin NPH Human (HUMULIN N,NOVOLIN N) 100 UNIT/ML injection Take 4 units twice a day with breakfast and at bedtime 10 mL 3  . Insulin Pen Needle (CAREFINE PEN NEEDLES) 32G X 4 MM MISC Use 3x a day 100 each 11  . Insulin Syringe-Needle U-100 (INSULIN SYRINGE .5CC/31GX5/16") 31G X 5/16" 0.5 ML MISC 1 Syringe by Does not apply route 4 (four) times daily as needed. 100 each 3  . metFORMIN (GLUCOPHAGE) 500 MG tablet Take 1 tablet (500 mg total) by mouth 3 (three) times daily with meals. 90 tablet 2  . ONETOUCH DELICA LANCETS FINE MISC Use to test blood sugar 6 times daily as instructed. 200 each 3  . Prenatal Vit-Fe Fumarate-FA (PRENATAL VITAMINS PLUS) 27-1 MG TABS Take 1 tablet by mouth daily. 30 tablet 12   No current facility-administered medications on file prior to visit.   No Known Allergies   Family History  Problem Relation Age of Onset  . Diabetes Father   . Kidney disease Father   . Diabetes Sister   . Diabetes Brother    PE: BP 108/74 mmHg  Pulse 94  Temp(Src) 98.4 F (36.9 C) (Oral)  Resp 18  Wt 146 lb (66.225 kg)  SpO2 98%  LMP 11/06/2013 (Exact Date) Wt Readings from Last 3 Encounters:  07/11/14 146 lb (66.225 kg)  07/06/14 143 lb 8 oz (65.091 kg)  07/03/14 142 lb 9.6 oz (64.683 kg)   Constitutional: Gravid appearing, in NAD Eyes: PERRLA, EOMI, no exophthalmos ENT: moist mucous membranes, no thyromegaly, no cervical lymphadenopathy Cardiovascular: RRR, No MRG Respiratory: CTA B Gastrointestinal: abdomen soft, NT, ND, BS+ Musculoskeletal: no deformities, strength intact  in all 4 Skin: moist, warm, no rashes Neurological: no tremor with outstretched hands, DTR normal in all 4  ASSESSMENT: 1. DM2, insulin-dependent, uncontrolled, without complications, during second trimester  PLAN:  1. Patient with several years h/o diabetes, now at the end of her pregnancy - since last visit, her OB/GYN doctor added NPH. She had some low sugars at the beginning of the treatment due to the higher dose, but now she decreased the dose and her sugars are more acceptable >> continue current regimen for now and she will need to decrease the doses and even possibly stop NovoLog after she gives birth, depending on her  sugars.  - I suggested to:  Patient Instructions   Please continue:  Insulin Before breakfast Before lunch Before dinner Bedtime  NovoLog 8 8 8  -  NPH 4 - - 4   Please inject the insulin 15 min before meals.  After you give birth, Please change to: - NPH 4 units 2x a day - NovoLog 4 units 3x a day - Metformin XR 500 mg 3x a day  You may stop the NovoLog if the sugars are at goal after the first day postpartum.   Please return in 1.5 month with your sugar log.   - continue checking sugars at different times of the day - check 6 times a day, rotating checks - reviewed last A1c >> great! - Return to clinic in 1.5 mo (2-3 weeks postpartum) with sugar log. At that time, we can hopefully switch back to Lantus and metformin only, but she may need a low dose glipizide.

## 2014-07-11 NOTE — Patient Instructions (Addendum)
Please continue:  Insulin Before breakfast Before lunch Before dinner Bedtime  NovoLog -  NPH 4 - - 4   Please inject the insulin 15 min before meals.  After you give birth, Please change to: - NPH 4 units 2x a day - NovoLog 4 units 3x a day - Metformin XR 500 mg 3x a day  You may stop the NovoLog if the sugars are at goal after the first day postpartum.   Please return in 1.5 month with your sugar log.

## 2014-07-13 ENCOUNTER — Ambulatory Visit (INDEPENDENT_AMBULATORY_CARE_PROVIDER_SITE_OTHER): Payer: Medicaid Other | Admitting: *Deleted

## 2014-07-13 DIAGNOSIS — E119 Type 2 diabetes mellitus without complications: Secondary | ICD-10-CM

## 2014-07-13 DIAGNOSIS — O24319 Unspecified pre-existing diabetes mellitus in pregnancy, unspecified trimester: Secondary | ICD-10-CM

## 2014-07-13 NOTE — Progress Notes (Signed)
NST reviewed and reactive.  

## 2014-07-17 ENCOUNTER — Ambulatory Visit (INDEPENDENT_AMBULATORY_CARE_PROVIDER_SITE_OTHER): Payer: Medicaid Other | Admitting: Family Medicine

## 2014-07-17 VITALS — BP 108/62 | HR 74 | Temp 98.0°F | Wt 146.9 lb

## 2014-07-17 DIAGNOSIS — O24913 Unspecified diabetes mellitus in pregnancy, third trimester: Secondary | ICD-10-CM

## 2014-07-17 DIAGNOSIS — O0993 Supervision of high risk pregnancy, unspecified, third trimester: Secondary | ICD-10-CM | POA: Diagnosis not present

## 2014-07-17 DIAGNOSIS — E119 Type 2 diabetes mellitus without complications: Secondary | ICD-10-CM | POA: Diagnosis not present

## 2014-07-17 DIAGNOSIS — O3421 Maternal care for scar from previous cesarean delivery: Secondary | ICD-10-CM | POA: Diagnosis not present

## 2014-07-17 DIAGNOSIS — O34219 Maternal care for unspecified type scar from previous cesarean delivery: Secondary | ICD-10-CM

## 2014-07-17 DIAGNOSIS — O24319 Unspecified pre-existing diabetes mellitus in pregnancy, unspecified trimester: Secondary | ICD-10-CM

## 2014-07-17 LAB — POCT URINALYSIS DIP (DEVICE)
Bilirubin Urine: NEGATIVE
Glucose, UA: NEGATIVE mg/dL
Hgb urine dipstick: NEGATIVE
Ketones, ur: NEGATIVE mg/dL
Leukocytes, UA: NEGATIVE
Nitrite: NEGATIVE
Protein, ur: NEGATIVE mg/dL
SPECIFIC GRAVITY, URINE: 1.01 (ref 1.005–1.030)
UROBILINOGEN UA: 0.2 mg/dL (ref 0.0–1.0)
pH: 5.5 (ref 5.0–8.0)

## 2014-07-17 LAB — OB RESULTS CONSOLE GC/CHLAMYDIA
CHLAMYDIA, DNA PROBE: NEGATIVE
GC PROBE AMP, GENITAL: NEGATIVE

## 2014-07-17 LAB — OB RESULTS CONSOLE GBS: STREP GROUP B AG: NEGATIVE

## 2014-07-17 MED ORDER — INSULIN NPH (HUMAN) (ISOPHANE) 100 UNIT/ML ~~LOC~~ SUSP: SUBCUTANEOUS | Status: DC

## 2014-07-17 MED ORDER — INSULIN ASPART 100 UNIT/ML FLEXPEN
8.0000 [IU] | PEN_INJECTOR | Freq: Three times a day (TID) | SUBCUTANEOUS | Status: DC
Start: 2014-07-17 — End: 2014-08-11

## 2014-07-17 NOTE — Patient Instructions (Signed)
Gestational Diabetes Mellitus Gestational diabetes mellitus, often simply referred to as gestational diabetes, is a type of diabetes that some women develop during pregnancy. In gestational diabetes, the pancreas does not make enough insulin (a hormone), the cells are less responsive to the insulin that is made (insulin resistance), or both.Normally, insulin moves sugars from food into the tissue cells. The tissue cells use the sugars for energy. The lack of insulin or the lack of normal response to insulin causes excess sugars to build up in the blood instead of going into the tissue cells. As a result, high blood sugar (hyperglycemia) develops. The effect of high sugar (glucose) levels can cause many problems.  RISK FACTORS You have an increased chance of developing gestational diabetes if you have a family history of diabetes and also have one or more of the following risk factors:  A body mass index over 30 (obesity).  A previous pregnancy with gestational diabetes.  An older age at the time of pregnancy. If blood glucose levels are kept in the normal range during pregnancy, women can have a healthy pregnancy. If your blood glucose levels are not well controlled, there may be risks to you, your unborn baby (fetus), your labor and delivery, or your newborn baby.  SYMPTOMS  If symptoms are experienced, they are much like symptoms you would normally expect during pregnancy. The symptoms of gestational diabetes include:   Increased thirst (polydipsia).  Increased urination (polyuria).  Increased urination during the night (nocturia).  Weight loss. This weight loss may be rapid.  Frequent, recurring infections.  Tiredness (fatigue).  Weakness.  Vision changes, such as blurred vision.  Fruity smell to your breath.  Abdominal pain. DIAGNOSIS Diabetes is diagnosed when blood glucose levels are increased. Your blood glucose level may be checked by one or more of the following blood  tests:  A fasting blood glucose test. You will not be allowed to eat for at least 8 hours before a blood sample is taken.  A random blood glucose test. Your blood glucose is checked at any time of the day regardless of when you ate.  A hemoglobin A1c blood glucose test. A hemoglobin A1c test provides information about blood glucose control over the previous 3 months.  An oral glucose tolerance test (OGTT). Your blood glucose is measured after you have not eaten (fasted) for 1-3 hours and then after you drink a glucose-containing beverage. Since the hormones that cause insulin resistance are highest at about 24-28 weeks of a pregnancy, an OGTT is usually performed during that time. If you have risk factors for gestational diabetes, your health care provider may test you for gestational diabetes earlier than 24 weeks of pregnancy. TREATMENT   You will need to take diabetes medicine or insulin daily to keep blood glucose levels in the desired range.  You will need to match insulin dosing with exercise and healthy food choices. The treatment goal is to maintain the before-meal (preprandial), bedtime, and overnight blood glucose level at 60-99 mg/dL during pregnancy. The treatment goal is to further maintain peak after-meal blood sugar (postprandial glucose) level at 100-140 mg/dL. HOME CARE INSTRUCTIONS   Have your hemoglobin A1c level checked twice a year.  Perform daily blood glucose monitoring as directed by your health care provider. It is common to perform frequent blood glucose monitoring.  Monitor urine ketones when you are ill and as directed by your health care provider.  Take your diabetes medicine and insulin as directed by your health care provider   to maintain your blood glucose level in the desired range.  Never run out of diabetes medicine or insulin. It is needed every day.  Adjust insulin based on your intake of carbohydrates. Carbohydrates can raise blood glucose levels but  need to be included in your diet. Carbohydrates provide vitamins, minerals, and fiber which are an essential part of a healthy diet. Carbohydrates are found in fruits, vegetables, whole grains, dairy products, legumes, and foods containing added sugars.  Eat healthy foods. Alternate 3 meals with 3 snacks.  Maintain a healthy weight gain. The usual total expected weight gain varies according to your prepregnancy body mass index (BMI).  Carry a medical alert card or wear your medical alert jewelry.  Carry a 15-gram carbohydrate snack with you at all times to treat low blood glucose (hypoglycemia). Some examples of 15-gram carbohydrate snacks include:  Glucose tablets, 3 or 4.  Glucose gel, 15-gram tube.  Raisins, 2 tablespoons (24 g).  Jelly beans, 6.  Animal crackers, 8.  Fruit juice, regular soda, or low-fat milk, 4 ounces (120 mL).  Gummy treats, 9.  Recognize hypoglycemia. Hypoglycemia during pregnancy occurs with blood glucose levels of 60 mg/dL and below. The risk for hypoglycemia increases when fasting or skipping meals, during or after intense exercise, and during sleep. Hypoglycemia symptoms can include:  Tremors or shakes.  Decreased ability to concentrate.  Sweating.  Increased heart rate.  Headache.  Dry mouth.  Hunger.  Irritability.  Anxiety.  Restless sleep.  Altered speech or coordination.  Confusion.  Treat hypoglycemia promptly. If you are alert and able to safely swallow, follow the 15:15 rule:  Take 15-20 grams of rapid-acting glucose or carbohydrate. Rapid-acting options include glucose gel, glucose tablets, or 4 ounces (120 mL) of fruit juice, regular soda, or low-fat milk.  Check your blood glucose level 15 minutes after taking the glucose.  Take 15-20 grams more of glucose if the repeat blood glucose level is still 70 mg/dL or below.  Eat a meal or snack within 1 hour once blood glucose levels return to normal.  Be alert to polyuria  (excess urination) and polydipsia (excess thirst) which are early signs of hyperglycemia. An early awareness of hyperglycemia allows for prompt treatment. Treat hyperglycemia as directed by your health care provider.  Engage in at least 30 minutes of physical activity a day or as directed by your health care provider. Ten minutes of physical activity timed 30 minutes after each meal is encouraged to control postprandial blood glucose levels.  Adjust your insulin dosing and food intake as needed if you start a new exercise or sport.  Follow your sick-day plan at any time you are unable to eat or drink as usual.  Avoid tobacco and alcohol use.  Keep all follow-up visits as directed by your health care provider.  Follow the advice of your health care provider regarding your prenatal and post-delivery (postpartum) appointments, meal planning, exercise, medicines, vitamins, blood tests, other medical tests, and physical activities.  Perform daily skin and foot care. Examine your skin and feet daily for cuts, bruises, redness, nail problems, bleeding, blisters, or sores.  Brush your teeth and gums at least twice a day and floss at least once a day. Follow up with your dentist regularly.  Schedule an eye exam during the first trimester of your pregnancy or as directed by your health care provider.  Share your diabetes management plan with your workplace or school.  Stay up-to-date with immunizations.  Learn to manage stress.    Obtain ongoing diabetes education and support as needed.  Learn about and consider breastfeeding your baby.  You should have your blood sugar level checked 6-12 weeks after delivery. This is done with an oral glucose tolerance test (OGTT). SEEK MEDICAL CARE IF:   You are unable to eat food or drink fluids for more than 6 hours.  You have nausea and vomiting for more than 6 hours.  You have a blood glucose level of 200 mg/dL and you have ketones in your  urine.  There is a change in mental status.  You develop vision problems.  You have a persistent headache.  You have upper abdominal pain or discomfort.  You develop an additional serious illness.  You have diarrhea for more than 6 hours.  You have been sick or have had a fever for a couple of days and are not getting better. SEEK IMMEDIATE MEDICAL CARE IF:   You have difficulty breathing.  You no longer feel the baby moving.  You are bleeding or have discharge from your vagina.  You start having premature contractions or labor. MAKE SURE YOU:  Understand these instructions.  Will watch your condition.  Will get help right away if you are not doing well or get worse. Document Released: 03/31/2000 Document Revised: 05/09/2013 Document Reviewed: 07/22/2011 [facility] Patient Information 2015 [place], [place]. This information is not intended to replace advice given to you by your health care provider. Make sure you discuss any questions you have with your health care provider.  Contraception Choices Contraception (birth control) is the use of any methods or devices to prevent pregnancy. Below are some methods to help avoid pregnancy. HORMONAL METHODS   Contraceptive implant. This is a thin, plastic tube containing progesterone hormone. It does not contain estrogen hormone. Your health care provider inserts the tube in the inner part of the upper arm. The tube can remain in place for up to 3 years. After 3 years, the implant must be removed. The implant prevents the ovaries from releasing an egg (ovulation), thickens the cervical mucus to prevent sperm from entering the uterus, and thins the lining of the inside of the uterus.  Progesterone-only injections. These injections are given every 3 months by your health care provider to prevent pregnancy. This synthetic progesterone hormone stops the ovaries from releasing eggs. It also thickens cervical mucus and changes the uterine  lining. This makes it harder for sperm to survive in the uterus.  Birth control pills. These pills contain estrogen and progesterone hormone. They work by preventing the ovaries from releasing eggs (ovulation). They also cause the cervical mucus to thicken, preventing the sperm from entering the uterus. Birth control pills are prescribed by a health care provider.Birth control pills can also be used to treat heavy periods.  Minipill. This type of birth control pill contains only the progesterone hormone. They are taken every day of each month and must be prescribed by your health care provider.  Birth control patch. The patch contains hormones similar to those in birth control pills. It must be changed once a week and is prescribed by a health care provider.  Vaginal ring. The ring contains hormones similar to those in birth control pills. It is left in the vagina for 3 weeks, removed for 1 week, and then a new one is put back in place. The patient must be comfortable inserting and removing the ring from the vagina.A health care provider's prescription is necessary.  Emergency contraception. Emergency contraceptives prevent pregnancy after  unprotected sexual intercourse. This pill can be taken right after sex or up to 5 days after unprotected sex. It is most effective the sooner you take the pills after having sexual intercourse. Most emergency contraceptive pills are available without a prescription. Check with your pharmacist. Do not use emergency contraception as your only form of birth control. BARRIER METHODS   Female condom. This is a thin sheath (latex or rubber) that is worn over the penis during sexual intercourse. It can be used with spermicide to increase effectiveness.  Female condom. This is a soft, loose-fitting sheath that is put into the vagina before sexual intercourse.  Diaphragm. This is a soft, latex, dome-shaped barrier that must be fitted by a health care provider. It is  inserted into the vagina, along with a spermicidal jelly. It is inserted before intercourse. The diaphragm should be left in the vagina for 6 to 8 hours after intercourse.  Cervical cap. This is a round, soft, latex or plastic cup that fits over the cervix and must be fitted by a health care provider. The cap can be left in place for up to 48 hours after intercourse.  Sponge. This is a soft, circular piece of polyurethane foam. The sponge has spermicide in it. It is inserted into the vagina after wetting it and before sexual intercourse.  Spermicides. These are chemicals that kill or block sperm from entering the cervix and uterus. They come in the form of creams, jellies, suppositories, foam, or tablets. They do not require a prescription. They are inserted into the vagina with an applicator before having sexual intercourse. The process must be repeated every time you have sexual intercourse. INTRAUTERINE CONTRACEPTION  Intrauterine device (IUD). This is a T-shaped device that is put in a woman's uterus during a menstrual period to prevent pregnancy. There are 2 types:  Copper IUD. This type of IUD is wrapped in copper wire and is placed inside the uterus. Copper makes the uterus and fallopian tubes produce a fluid that kills sperm. It can stay in place for 10 years.  Hormone IUD. This type of IUD contains the hormone progestin (synthetic progesterone). The hormone thickens the cervical mucus and prevents sperm from entering the uterus, and it also thins the uterine lining to prevent implantation of a fertilized egg. The hormone can weaken or kill the sperm that get into the uterus. It can stay in place for 3-5 years, depending on which type of IUD is used. PERMANENT METHODS OF CONTRACEPTION  Female tubal ligation. This is when the woman's fallopian tubes are surgically sealed, tied, or blocked to prevent the egg from traveling to the uterus.  Hysteroscopic sterilization. This involves placing a  small coil or insert into each fallopian tube. Your doctor uses a technique called hysteroscopy to do the procedure. The device causes scar tissue to form. This results in permanent blockage of the fallopian tubes, so the sperm cannot fertilize the egg. It takes about 3 months after the procedure for the tubes to become blocked. You must use another form of birth control for these 3 months.  Female sterilization. This is when the female has the tubes that carry sperm tied off (vasectomy).This blocks sperm from entering the vagina during sexual intercourse. After the procedure, the man can still ejaculate fluid (semen). NATURAL PLANNING METHODS  Natural family planning. This is not having sexual intercourse or using a barrier method (condom, diaphragm, cervical cap) on days the woman could become pregnant.  Calendar method. This is keeping  track of the length of each menstrual cycle and identifying when you are fertile.  Ovulation method. This is avoiding sexual intercourse during ovulation.  Symptothermal method. This is avoiding sexual intercourse during ovulation, using a thermometer and ovulation symptoms.  Post-ovulation method. This is timing sexual intercourse after you have ovulated. Regardless of which type or method of contraception you choose, it is important that you use condoms to protect against the transmission of sexually transmitted infections (STIs). Talk with your health care provider about which form of contraception is most appropriate for you. Document Released: 12/23/2004 Document Revised: 12/28/2012 Document Reviewed: 06/17/2012 [facility] Patient Information 2015 [place], [place]. This information is not intended to replace advice given to you by your health care provider. Make sure you discuss any questions you have with your health care provider.  Breastfeeding Deciding to breastfeed is one of the best choices you can make for you and your baby. A change in hormones during  pregnancy causes your breast tissue to grow and increases the number and size of your milk ducts. These hormones also allow proteins, sugars, and fats from your blood supply to make breast milk in your milk-producing glands. Hormones prevent breast milk from being released before your baby is born as well as prompt milk flow after birth. Once breastfeeding has begun, thoughts of your baby, as well as his or her sucking or crying, can stimulate the release of milk from your milk-producing glands.  BENEFITS OF BREASTFEEDING For Your Baby  Your first milk (colostrum) helps your baby's digestive system function better.   There are antibodies in your milk that help your baby fight off infections.   Your baby has a lower incidence of asthma, allergies, and sudden infant death syndrome.   The nutrients in breast milk are better for your baby than infant formulas and are designed uniquely for your baby's needs.   Breast milk improves your baby's brain development.   Your baby is less likely to develop other conditions, such as childhood obesity, asthma, or type 2 diabetes mellitus.  For You   Breastfeeding helps to create a very special bond between you and your baby.   Breastfeeding is convenient. Breast milk is always available at the correct temperature and costs nothing.   Breastfeeding helps to burn calories and helps you lose the weight gained during pregnancy.   Breastfeeding makes your uterus contract to its prepregnancy size faster and slows bleeding (lochia) after you give birth.   Breastfeeding helps to lower your risk of developing type 2 diabetes mellitus, osteoporosis, and breast or ovarian cancer later in life. SIGNS THAT YOUR BABY IS HUNGRY Early Signs of Hunger  Increased alertness or activity.  Stretching.  Movement of the head from side to side.  Movement of the head and opening of the mouth when the corner of the mouth or cheek is stroked  (rooting).  Increased sucking sounds, smacking lips, cooing, sighing, or squeaking.  Hand-to-mouth movements.  Increased sucking of fingers or hands. Late Signs of Hunger  Fussing.  Intermittent crying. Extreme Signs of Hunger Signs of extreme hunger will require calming and consoling before your baby will be able to breastfeed successfully. Do not wait for the following signs of extreme hunger to occur before you initiate breastfeeding:   Restlessness.  A loud, strong cry.   Screaming. BREASTFEEDING BASICS Breastfeeding Initiation  Find a comfortable place to sit or lie down, with your neck and back well supported.  Place a pillow or rolled up blanket  under your baby to bring him or her to the level of your breast (if you are seated). Nursing pillows are specially designed to help support your arms and your baby while you breastfeed.  Make sure that your baby's abdomen is facing your abdomen.   Gently massage your breast. With your fingertips, massage from your chest wall toward your nipple in a circular motion. This encourages milk flow. You may need to continue this action during the feeding if your milk flows slowly.  Support your breast with 4 fingers underneath and your thumb above your nipple. Make sure your fingers are well away from your nipple and your baby's mouth.   Stroke your baby's lips gently with your finger or nipple.   When your baby's mouth is open wide enough, quickly bring your baby to your breast, placing your entire nipple and as much of the colored area around your nipple (areola) as possible into your baby's mouth.   More areola should be visible above your baby's upper lip than below the lower lip.   Your baby's tongue should be between his or her lower gum and your breast.   Ensure that your baby's mouth is correctly positioned around your nipple (latched). Your baby's lips should create a seal on your breast and be turned out  (everted).  It is common for your baby to suck about 2-3 minutes in order to start the flow of breast milk. Latching Teaching your baby how to latch on to your breast properly is very important. An improper latch can cause nipple pain and decreased milk supply for you and poor weight gain in your baby. Also, if your baby is not latched onto your nipple properly, he or she may swallow some air during feeding. This can make your baby fussy. Burping your baby when you switch breasts during the feeding can help to get rid of the air. However, teaching your baby to latch on properly is still the best way to prevent fussiness from swallowing air while breastfeeding. Signs that your baby has successfully latched on to your nipple:    Silent tugging or silent sucking, without causing you pain.   Swallowing heard between every 3-4 sucks.    Muscle movement above and in front of his or her ears while sucking.  Signs that your baby has not successfully latched on to nipple:   Sucking sounds or smacking sounds from your baby while breastfeeding.  Nipple pain. If you think your baby has not latched on correctly, slip your finger into the corner of your baby's mouth to break the suction and place it between your baby's gums. Attempt breastfeeding initiation again. Signs of Successful Breastfeeding Signs from your baby:   A gradual decrease in the number of sucks or complete cessation of sucking.   Falling asleep.   Relaxation of his or her body.   Retention of a small amount of milk in his or her mouth.   Letting go of your breast by himself or herself. Signs from you:  Breasts that have increased in firmness, weight, and size 1-3 hours after feeding.   Breasts that are softer immediately after breastfeeding.  Increased milk volume, as well as a change in milk consistency and color by the fifth day of breastfeeding.   Nipples that are not sore, cracked, or bleeding. Signs That  Your Pecola Leisure is Getting Enough Milk  Wetting at least 3 diapers in a 24-hour period. The urine should be clear and pale yellow by  age 900 days.  At least 3 stools in a 24-hour period by age 900 days. The stool should be soft and yellow.  At least 3 stools in a 24-hour period by age 90 days. The stool should be seedy and yellow.  No loss of weight greater than 10% of birth weight during the first 62 days of age.  Average weight gain of 4-7 ounces (113-198 g) per week after age 34 days.  Consistent daily weight gain by age 900 days, without weight loss after the age of 2 weeks. After a feeding, your baby may spit up a small amount. This is common. BREASTFEEDING FREQUENCY AND DURATION Frequent feeding will help you make more milk and can prevent sore nipples and breast engorgement. Breastfeed when you feel the need to reduce the fullness of your breasts or when your baby shows signs of hunger. This is called "breastfeeding on demand." Avoid introducing a pacifier to your baby while you are working to establish breastfeeding (the first 4-6 weeks after your baby is born). After this time you may choose to use a pacifier. Research has shown that pacifier use during the first year of a baby's life decreases the risk of sudden infant death syndrome (SIDS). Allow your baby to feed on each breast as long as he or she wants. Breastfeed until your baby is finished feeding. When your baby unlatches or falls asleep while feeding from the first breast, offer the second breast. Because newborns are often sleepy in the first few weeks of life, you may need to awaken your baby to get him or her to feed. Breastfeeding times will vary from baby to baby. However, the following rules can serve as a guide to help you ensure that your baby is properly fed:  Newborns (babies 19 weeks of age or younger) may breastfeed every 1-3 hours.  Newborns should not go longer than 3 hours during the day or 5 hours during the night without  breastfeeding.  You should breastfeed your baby a minimum of 8 times in a 24-hour period until you begin to introduce solid foods to your baby at around 27 months of age. BREAST MILK PUMPING Pumping and storing breast milk allows you to ensure that your baby is exclusively fed your breast milk, even at times when you are unable to breastfeed. This is especially important if you are going back to work while you are still breastfeeding or when you are not able to be present during feedings. Your lactation consultant can give you guidelines on how long it is safe to store breast milk.  A breast pump is a machine that allows you to pump milk from your breast into a sterile bottle. The pumped breast milk can then be stored in a refrigerator or freezer. Some breast pumps are operated by hand, while others use electricity. Ask your lactation consultant which type will work best for you. Breast pumps can be purchased, but some hospitals and breastfeeding support groups lease breast pumps on a monthly basis. A lactation consultant can teach you how to hand express breast milk, if you prefer not to use a pump.  CARING FOR YOUR BREASTS WHILE YOU BREASTFEED Nipples can become dry, cracked, and sore while breastfeeding. The following recommendations can help keep your breasts moisturized and healthy:  Avoid using soap on your nipples.   Wear a supportive bra. Although not required, special nursing bras and tank tops are designed to allow access to your breasts for breastfeeding without taking off  your entire bra or top. Avoid wearing underwire-style bras or extremely tight bras.  Air dry your nipples for 3-9minutes after each feeding.   Use only cotton bra pads to absorb leaked breast milk. Leaking of breast milk between feedings is normal.   Use lanolin on your nipples after breastfeeding. Lanolin helps to maintain your skin's normal moisture barrier. If you use pure lanolin, you do not need to wash it off  before feeding your baby again. Pure lanolin is not toxic to your baby. You may also hand express a few drops of breast milk and gently massage that milk into your nipples and allow the milk to air dry. In the first few weeks after giving birth, some women experience extremely full breasts (engorgement). Engorgement can make your breasts feel heavy, warm, and tender to the touch. Engorgement peaks within 3-5 days after you give birth. The following recommendations can help ease engorgement:  Completely empty your breasts while breastfeeding or pumping. You may want to start by applying warm, moist heat (in the shower or with warm water-soaked hand towels) just before feeding or pumping. This increases circulation and helps the milk flow. If your baby does not completely empty your breasts while breastfeeding, pump any extra milk after he or she is finished.  Wear a snug bra (nursing or regular) or tank top for 1-2 days to signal your body to slightly decrease milk production.  Apply ice packs to your breasts, unless this is too uncomfortable for you.  Make sure that your baby is latched on and positioned properly while breastfeeding. If engorgement persists after 48 hours of following these recommendations, contact your health care provider or a Advertising copywriter. OVERALL HEALTH CARE RECOMMENDATIONS WHILE BREASTFEEDING  Eat healthy foods. Alternate between meals and snacks, eating 3 of each per day. Because what you eat affects your breast milk, some of the foods may make your baby more irritable than usual. Avoid eating these foods if you are sure that they are negatively affecting your baby.  Drink milk, fruit juice, and water to satisfy your thirst (about 10 glasses a day).   Rest often, relax, and continue to take your prenatal vitamins to prevent fatigue, stress, and anemia.  Continue breast self-awareness checks.  Avoid chewing and smoking tobacco.  Avoid alcohol and drug use. Some  medicines that may be harmful to your baby can pass through breast milk. It is important to ask your health care provider before taking any medicine, including all over-the-counter and prescription medicine as well as vitamin and herbal supplements. It is possible to become pregnant while breastfeeding. If birth control is desired, ask your health care provider about options that will be safe for your baby. SEEK MEDICAL CARE IF:   You feel like you want to stop breastfeeding or have become frustrated with breastfeeding.  You have painful breasts or nipples.  Your nipples are cracked or bleeding.  Your breasts are red, tender, or warm.  You have a swollen area on either breast.  You have a fever or chills.  You have nausea or vomiting.  You have drainage other than breast milk from your nipples.  Your breasts do not become full before feedings by the fifth day after you give birth.  You feel sad and depressed.  Your baby is too sleepy to eat well.  Your baby is having trouble sleeping.   Your baby is wetting less than 3 diapers in a 24-hour period.  Your baby has less than 3  stools in a 24-hour period.  Your baby's skin or the white part of his or her eyes becomes yellow.   Your baby is not gaining weight by 95 days of age. SEEK IMMEDIATE MEDICAL CARE IF:   Your baby is overly tired (lethargic) and does not want to wake up and feed.  Your baby develops an unexplained fever. Document Released: 12/23/2004 Document Revised: 12/28/2012 Document Reviewed: 06/16/2012 [facility] Patient Information 2015 [place], [place]. This information is not intended to replace advice given to you by your health care provider. Make sure you discuss any questions you have with your health care provider.

## 2014-07-17 NOTE — Progress Notes (Signed)
Breastfeeding tip of the week reviewed Cultures

## 2014-07-17 NOTE — Progress Notes (Signed)
Subjective:  Theresa Koch is a 34 y.o. G2P1001 at 7628w1d being seen today for ongoing prenatal care.  Patient reports no complaints.  Contractions: Not present.  Vag. Bleeding: None. Movement: Present. Denies leaking of fluid.   The following portions of the patient's history were reviewed and updated as appropriate: allergies, current medications, past family history, past medical history, past social history, past surgical history and problem list.   Objective:   Filed Vitals:   07/17/14 0944  BP: 108/62  Pulse: 74  Temp: 98 F (36.7 C)  Weight: 146 lb 14.4 oz (66.633 kg)    Fetal Status: Fetal Heart Rate (bpm): 136   Movement: Present     General:  Alert, oriented and cooperative. Patient is in no acute distress.  Skin: Skin is warm and dry. No rash noted.   Cardiovascular: Normal heart rate noted  Respiratory: Normal respiratory effort, no problems with respiration noted  Abdomen: Soft, gravid, appropriate for gestational age. Pain/Pressure: Present     Vaginal: Vag. Bleeding: None.       Cervix: Exam revealed  closed/thick/high      Extremities: Normal range of motion.  Edema: None  Mental Status: Normal mood and affect. Normal behavior. Normal judgment and thought content.   Urinalysis: Urine Protein: Negative Urine Glucose: Negative FBS 78-107 1/2 of BS are out of range. 2 hr pp 65-179- most abnormal values are post breakfast NST reviewed and reactive. Assessment and Plan:  Pregnancy: G2P1001 at 5528w1d  1. Supervision of high risk pregnancy, antepartum, third trimester Continue routine prenatal care. - GC/Chlamydia Probe Amp - Culture, beta strep (group b only)  2. Maternal pregestational diabetes classes B through R, antepartum Continue 2x/wk testing  3. Previous cesarean section complicating pregnancy, antepartum condition or complication For TOLAC---await EFW  4. Diabetes mellitus affecting pregnancy in third trimester, antepartum Insulin adjustment up for  coverage. - insulin NPH Human (HUMULIN N) 100 UNIT/ML injection; Take 4 units with breakfast 5 units at bedtime - PENS please  Dispense: 15 mL; Refill: 2 - insulin aspart (NOVOLOG FLEXPEN) 100 UNIT/ML FlexPen; Inject 8-10 Units into the skin 3 (three) times daily with meals. 10 units q am, 8 units with lunch and dinner  Dispense: 15 mL; Refill: 2 - US OB Follow Up; Future   Term labor symptoms and general obstetric precautions including but not limited to vaginal bleeding, contractions, leaking of fluid and fetal movement were reviewed in detail with the patient.  Please refer to After Visit Summary for other counseling recommendations.   Return in about 1 week (around 07/24/2014) for OB visit and NST, HRC, NST only in 4 days.   Reva Boresanya S Bridgitte Felicetti, MD

## 2014-07-18 LAB — GC/CHLAMYDIA PROBE AMP
CT PROBE, AMP APTIMA: NEGATIVE
GC Probe RNA: NEGATIVE

## 2014-07-19 LAB — CULTURE, BETA STREP (GROUP B ONLY)

## 2014-07-20 ENCOUNTER — Ambulatory Visit (INDEPENDENT_AMBULATORY_CARE_PROVIDER_SITE_OTHER): Payer: Medicaid Other | Admitting: *Deleted

## 2014-07-20 VITALS — BP 105/65 | HR 93

## 2014-07-20 DIAGNOSIS — O24319 Unspecified pre-existing diabetes mellitus in pregnancy, unspecified trimester: Secondary | ICD-10-CM

## 2014-07-20 DIAGNOSIS — E119 Type 2 diabetes mellitus without complications: Secondary | ICD-10-CM | POA: Diagnosis not present

## 2014-07-20 NOTE — Addendum Note (Signed)
Addended by: Jill SideAY, DIANE L on: 07/20/2014 04:21 PM   Modules accepted: Orders

## 2014-07-20 NOTE — Progress Notes (Signed)
NST reviewed and reactive.  Kamerin Grumbine L. Harraway-Smith, M.D., FACOG    

## 2014-07-24 ENCOUNTER — Ambulatory Visit (INDEPENDENT_AMBULATORY_CARE_PROVIDER_SITE_OTHER): Payer: Medicaid Other | Admitting: Obstetrics & Gynecology

## 2014-07-24 VITALS — BP 104/70 | HR 74 | Wt 148.2 lb

## 2014-07-24 DIAGNOSIS — E119 Type 2 diabetes mellitus without complications: Secondary | ICD-10-CM | POA: Diagnosis not present

## 2014-07-24 DIAGNOSIS — O24319 Unspecified pre-existing diabetes mellitus in pregnancy, unspecified trimester: Secondary | ICD-10-CM | POA: Diagnosis not present

## 2014-07-24 DIAGNOSIS — O0993 Supervision of high risk pregnancy, unspecified, third trimester: Secondary | ICD-10-CM

## 2014-07-24 LAB — POCT URINALYSIS DIP (DEVICE)
Bilirubin Urine: NEGATIVE
Glucose, UA: NEGATIVE mg/dL
HGB URINE DIPSTICK: NEGATIVE
Ketones, ur: NEGATIVE mg/dL
Leukocytes, UA: NEGATIVE
Nitrite: NEGATIVE
PH: 7 (ref 5.0–8.0)
Protein, ur: NEGATIVE mg/dL
Specific Gravity, Urine: 1.02 (ref 1.005–1.030)
UROBILINOGEN UA: 0.2 mg/dL (ref 0.0–1.0)

## 2014-07-24 NOTE — Progress Notes (Signed)
US for growth scheduled 7/28.

## 2014-07-24 NOTE — Progress Notes (Signed)
Discussed with patient that this is a teaching facility and that during their pregnancy there may be female physicians and other healthcare providers involved in their care. This includes, but is not limited to, prenatal visits and ultrasound examinations, as well as, the labor and delivery process and postpartum care. Also discussed with patient that they do have the right to transfer their care to another practice in the event that they do not agree or wish to see female providers under any situation. Informed patient that we will make every attempt to have a female provider care for them, though this cannot be guaranteed, such as in an emergent situation. Also, reminded patient that when they are scheduling their appointments, to request a female provider each time and we will try to accommodate their request.   Female provider form signed

## 2014-07-24 NOTE — Progress Notes (Signed)
Subjective:  Theresa Koch is a 34 y.o. G2P1001 at 5447w1d being seen today for ongoing prenatal care.  Patient reports no complaints.  Contractions: Not present.  Vag. Bleeding: None. Movement: Present. Denies leaking of fluid.   The following portions of the patient's history were reviewed and updated as appropriate: allergies, current medications, past family history, past medical history, past social history, past surgical history and problem list.   Objective:   Filed Vitals:   07/24/14 0908  BP: 104/70  Pulse: 74  Weight: 148 lb 3.2 oz (67.223 kg)    Fetal Status: Fetal Heart Rate (bpm): NST Fundal Height: 37 cm Movement: Present  Presentation: Vertex  General:  Alert, oriented and cooperative. Patient is in no acute distress.  Skin: Skin is warm and dry. No rash noted.   Cardiovascular: Normal heart rate noted  Respiratory: Normal respiratory effort, no problems with respiration noted  Abdomen: Soft, gravid, appropriate for gestational age. Pain/Pressure: Present     Vaginal: Vag. Bleeding: None.       Cervix: Not evaluated        Extremities: Normal range of motion.  Edema: None  Mental Status: Normal mood and affect. Normal behavior. Normal judgment and thought content.   Urinalysis: Urine Protein: Negative Urine Glucose: Negative  Assessment and Plan:  Pregnancy: G2P1001 at 4447w1d  1. Maternal pregestational diabetes classes B through R, antepartum CBGs well conrolled, continue current plan of care - Fetal nonstress test Induction at 6239 [address] Debroah LoopArnold is on teh evening and Dr. Shawnie PonsPratt will be on at 8 am.  2. 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 Please refer to After Visit Summary for other counseling recommendations.  Return  for 2x/wk as scheduled.   Lesly DukesKelly H Zauria Dombek, MD

## 2014-07-27 ENCOUNTER — Encounter: Payer: Self-pay | Admitting: *Deleted

## 2014-07-27 ENCOUNTER — Ambulatory Visit (INDEPENDENT_AMBULATORY_CARE_PROVIDER_SITE_OTHER): Payer: Medicaid Other | Admitting: *Deleted

## 2014-07-27 VITALS — BP 111/67 | HR 84

## 2014-07-27 DIAGNOSIS — O24319 Unspecified pre-existing diabetes mellitus in pregnancy, unspecified trimester: Secondary | ICD-10-CM

## 2014-07-27 DIAGNOSIS — E119 Type 2 diabetes mellitus without complications: Secondary | ICD-10-CM | POA: Diagnosis not present

## 2014-07-27 DIAGNOSIS — O24313 Unspecified pre-existing diabetes mellitus in pregnancy, third trimester: Secondary | ICD-10-CM | POA: Diagnosis not present

## 2014-07-27 NOTE — Progress Notes (Signed)
[place] fir growth on 7/28

## 2014-07-28 ENCOUNTER — Telehealth (HOSPITAL_COMMUNITY): Payer: Self-pay | Admitting: *Deleted

## 2014-07-28 NOTE — Telephone Encounter (Signed)
Preadmission screen  

## 2014-07-31 ENCOUNTER — Ambulatory Visit (INDEPENDENT_AMBULATORY_CARE_PROVIDER_SITE_OTHER): Payer: Medicaid Other | Admitting: Obstetrics & Gynecology

## 2014-07-31 ENCOUNTER — Encounter: Payer: Self-pay | Admitting: Obstetrics and Gynecology

## 2014-07-31 VITALS — BP 104/61 | HR 79 | Wt 151.5 lb

## 2014-07-31 DIAGNOSIS — O24319 Unspecified pre-existing diabetes mellitus in pregnancy, unspecified trimester: Secondary | ICD-10-CM

## 2014-07-31 DIAGNOSIS — O0993 Supervision of high risk pregnancy, unspecified, third trimester: Secondary | ICD-10-CM

## 2014-07-31 DIAGNOSIS — E119 Type 2 diabetes mellitus without complications: Secondary | ICD-10-CM | POA: Diagnosis not present

## 2014-07-31 LAB — POCT URINALYSIS DIP (DEVICE)
BILIRUBIN URINE: NEGATIVE
Glucose, UA: NEGATIVE mg/dL
Hgb urine dipstick: NEGATIVE
Ketones, ur: NEGATIVE mg/dL
Leukocytes, UA: NEGATIVE
Nitrite: NEGATIVE
PROTEIN: NEGATIVE mg/dL
SPECIFIC GRAVITY, URINE: 1.02 (ref 1.005–1.030)
UROBILINOGEN UA: 0.2 mg/dL (ref 0.0–1.0)
pH: 6 (ref 5.0–8.0)

## 2014-07-31 NOTE — Progress Notes (Signed)
NST reviewed and reactive.  Roddy Bellamy L. Harraway-Smith, M.D., FACOG    

## 2014-07-31 NOTE — Progress Notes (Signed)
Subjective:  Theresa Koch is a 34 y.o. G2P1001 at [redacted]w[redacted]d being seen today for ongoing prenatal care.  Patient reports no complaints.  Contractions: Irregular.  Vag. Bleeding: None. Movement: Present. Denies leaking of fluid.   The following portions of the patient's history were reviewed and updated as appropriate: allergies, current medications, past family history, past medical history, past social history, past surgical history and problem list.   Objective:   Filed Vitals:   07/31/14 0855  BP: 104/61  Pulse: 79  Weight: 151 lb 8 oz (68.72 kg)    Fetal Status: Fetal Heart Rate (bpm): RNST   Movement: Present     General:  Alert, oriented and cooperative. Patient is in no acute distress.  Skin: Skin is warm and dry. No rash noted.   Cardiovascular: Normal heart rate noted  Respiratory: Normal respiratory effort, no problems with respiration noted  Abdomen: Soft, gravid, appropriate for gestational age. Pain/Pressure: Present     Vaginal: Vag. Bleeding: None.       Cervix: Not evaluated        Extremities: Normal range of motion.  Edema: None  Mental Status: Normal mood and affect. Normal behavior. Normal judgment and thought content.   Urinalysis: Urine Protein: Negative Urine Glucose: Negative  Assessment and Plan:  Pregnancy: G2P1001 at [redacted]w[redacted]d  1. Maternal pregestational diabetes classes B through R, antepartum -growth [place] this Thursday - Fetal nonstress test -CBG under good control, a few highs when she did not take her insulin, but overall doing well.   2. Supervision of high risk pregnancy, antepartum, third trimester -induction at 39 weeks  Term labor symptoms and general obstetric precautions including but not limited to vaginal bleeding, contractions, leaking of fluid and fetal movement were reviewed in detail with the patient. Please refer to After Visit Summary for other counseling recommendations.  Return in about 3 days (around 08/03/2014) for as  scheduled.   Lesly Dukes, MD

## 2014-07-31 NOTE — Progress Notes (Signed)
[place] for growth scheduled on 7/28.  IOL on 8/1.  Breastfeeding tip of the week reviewed.

## 2014-08-03 ENCOUNTER — Ambulatory Visit (HOSPITAL_COMMUNITY)
Admission: RE | Admit: 2014-08-03 | Discharge: 2014-08-03 | Disposition: A | Discharge status: Home / Self Care | Payer: Medicaid Other | Source: Ambulatory Visit | Attending: Obstetrics & Gynecology | Admitting: Obstetrics & Gynecology

## 2014-08-03 ENCOUNTER — Ambulatory Visit (HOSPITAL_COMMUNITY)
Admission: RE | Admit: 2014-08-03 | Discharge: 2014-08-03 | Disposition: A | Discharge status: Home / Self Care | Payer: Medicaid Other | Source: Ambulatory Visit | Attending: Family Medicine | Admitting: Family Medicine

## 2014-08-03 ENCOUNTER — Inpatient Hospital Stay (HOSPITAL_COMMUNITY)
Admission: AD | Admit: 2014-08-03 | Discharge: 2014-08-03 | Disposition: A | Discharge status: Home / Self Care | Payer: Medicaid Other | Source: Ambulatory Visit | Attending: Obstetrics & Gynecology | Admitting: Obstetrics & Gynecology

## 2014-08-03 ENCOUNTER — Other Ambulatory Visit: Payer: Self-pay | Admitting: Family Medicine

## 2014-08-03 ENCOUNTER — Encounter (HOSPITAL_COMMUNITY): Payer: Self-pay

## 2014-08-03 VITALS — BP 118/65 | HR 74 | Wt 153.1 lb

## 2014-08-03 DIAGNOSIS — O24113 Pre-existing diabetes mellitus, type 2, in pregnancy, third trimester: Secondary | ICD-10-CM | POA: Insufficient documentation

## 2014-08-03 DIAGNOSIS — O34219 Maternal care for unspecified type scar from previous cesarean delivery: Secondary | ICD-10-CM

## 2014-08-03 DIAGNOSIS — O368131 Decreased fetal movements, third trimester, fetus 1: Secondary | ICD-10-CM

## 2014-08-03 DIAGNOSIS — O368130 Decreased fetal movements, third trimester, not applicable or unspecified: Secondary | ICD-10-CM | POA: Diagnosis not present

## 2014-08-03 DIAGNOSIS — O24319 Unspecified pre-existing diabetes mellitus in pregnancy, unspecified trimester: Secondary | ICD-10-CM

## 2014-08-03 DIAGNOSIS — E119 Type 2 diabetes mellitus without complications: Secondary | ICD-10-CM | POA: Diagnosis not present

## 2014-08-03 DIAGNOSIS — Z3A38 38 weeks gestation of pregnancy: Secondary | ICD-10-CM | POA: Insufficient documentation

## 2014-08-03 DIAGNOSIS — O3421 Maternal care for scar from previous cesarean delivery: Secondary | ICD-10-CM | POA: Diagnosis not present

## 2014-08-03 DIAGNOSIS — Z794 Long term (current) use of insulin: Secondary | ICD-10-CM | POA: Insufficient documentation

## 2014-08-03 DIAGNOSIS — O24913 Unspecified diabetes mellitus in pregnancy, third trimester: Secondary | ICD-10-CM

## 2014-08-03 DIAGNOSIS — Z7982 Long term (current) use of aspirin: Secondary | ICD-10-CM | POA: Insufficient documentation

## 2014-08-03 DIAGNOSIS — O0993 Supervision of high risk pregnancy, unspecified, third trimester: Secondary | ICD-10-CM

## 2014-08-03 LAB — GLUCOSE, CAPILLARY
Glucose-Capillary: 61 mg/dL — ABNORMAL LOW (ref 65–99)
Glucose-Capillary: 71 mg/dL (ref 65–99)

## 2014-08-03 NOTE — Progress Notes (Signed)
E-sign not working, patient verbalized an understanding of discharge instructions.

## 2014-08-03 NOTE — MAU Note (Signed)
Notified Dr. Freida Busman on unit patient sent from clinic for decreased fetal movement.

## 2014-08-03 NOTE — Discharge Instructions (Signed)

## 2014-08-03 NOTE — MAU Provider Note (Signed)
Chief Complaint:  Decreased Fetal Movement  First Provider Initiated Contact with Patient 08/06/2014 at 1220.   HPI: Theresa Koch is a 34 y.o. G2P1001 at 29w4dwith pre-gestational diabetes on oral medications who presents to maternity admissions from [facility] for fetal monitoring due to decreased fetal movement since yesterday and variable deceleration 1 at MFM. She is in antenatal testing for diabetes and was having her scheduled BPP which was 8/8. Denies contractions, leakage of fluid or vaginal bleeding.   Patient Active Problem List   Diagnosis Date Noted  . [redacted] weeks gestation of pregnancy   . Supervision of high risk pregnancy, antepartum 06/12/2014  . 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 Maternal pregestational diabetes classes B through R, antepartum   . Previous cesarean section complicating pregnancy, antepartum condition or complication 037/34/2876  Past Medical History: Past Medical History  Diagnosis Date  . Diabetes mellitus without complication     Past obstetric history: OB History  Gravida Para Term Preterm AB SAB TAB Ectopic Multiple Living  2 1 1       1     # Outcome Date GA Lbr Len/2nd Weight Sex Delivery Anes PTL Lv  2 Current           1 Term 02/28/12 325w0d5 lb 15.2 oz (2.7 kg) F CS-LTranv EPI N Y    Obstetric Comments  Had diabetes during pregnancy. Planned c/s at 38 weeks [place] to diabetes[place]   Past Surgical History: Past Surgical History  Procedure Laterality Date  . Cesarean section       Family History: Family History  Problem Relation Age of Onset  . Diabetes Father   . Kidney disease Father   . Diabetes Sister   . Diabetes Brother     Social History: History  Substance Use Topics  . Smoking status: Never Smoker   . Smokeless tobacco: Never Used  . Alcohol Use: No    Allergies: No Known Allergies  Meds:  Prescriptions prior to admission  Medication Sig Dispense Refill Last Dose  . aspirin EC 81 MG tablet Take  1 tablet (81 mg total) by mouth daily. Take after 12 weeks for prevention of preeclampssia later in pregnancy 300 tablet 2 Past Week at Unknown time  . folic acid (FOLVITE) 1 MG tablet Take 1 tablet (1 mg total) by mouth daily. 100 tablet 12 08/02/2014 at Unknown time  . insulin aspart (NOVOLOG FLEXPEN) 100 UNIT/ML FlexPen Inject 8-10 Units into the skin 3 (three) times daily with meals. 10 units q am, 8 units with lunch and dinner 15 mL 2 08/03/2014 at Unknown time  . insulin NPH Human (HUMULIN N) 100 UNIT/ML injection Take 4 units with breakfast 5 units at bedtime - PENS please 15 mL 2 08/03/2014 at Unknown time  . metFORMIN (GLUCOPHAGE) 500 MG tablet Take 1 tablet (500 mg total) by mouth 3 (three) times daily with meals. 90 tablet 2 08/02/2014 at Unknown time  . Prenatal Vit-Fe Fumarate-FA (PRENATAL VITAMINS PLUS) 27-1 MG TABS Take 1 tablet by mouth daily. 30 tablet 12 Past Week at Unknown time  . ACCU-CHEK SOFTCLIX LANCETS lancets Use to test blood sugar 6 times daily as instructed. Dx code: O2O11.572 620ach 3 Taking  . Blood Glucose Monitoring Suppl (ACCU-CHEK AVIVA PLUS) W/DEVICE KIT Use to test blood sugar 6 times daily as instructed. Dx code: O2B55.974 kit 0 Taking  . glucose blood (ACCU-CHEK AVIVA PLUS) test strip Use to test blood sugar  6 times daily as instructed. Dx code: F62.130 865 each 3 Taking  . Insulin Pen Needle (CAREFINE PEN NEEDLES) 32G X 4 MM MISC Use 5x a day 200 each 3 Taking  . Insulin Syringe-Needle U-100 (INSULIN SYRINGE .5CC/31GX5/16") 31G X 5/16" 0.5 ML MISC 1 Syringe by Does not apply route 4 (four) times daily as needed. 100 each 3 Taking  . ONETOUCH DELICA LANCETS FINE MISC Use to test blood sugar 6 times daily as instructed. 200 each 3 Taking    ROS:  Review of Systems  Gastrointestinal: Negative for abdominal pain.  Genitourinary:       Negative for vaginal bleeding or leaking of fluid.    Physical Exam  Blood pressure 116/64, pulse 72, resp. rate 16, height 5' 6"   (1.676 m), weight 152 lb (68.947 kg), last menstrual period 11/06/2013. GENERAL: Well-developed, well-nourished female in no acute distress.  HEART: normal rate RESP: normal effort GI: Abd soft, non-tender, gravid appropriate for gestational age.  NEURO: Alert and oriented x 4.   FHT:  Baseline 130 , moderate variability, accelerations present, no decelerations Contractions: irreg, mild   Labs: Results for orders placed or performed during the hospital encounter of 08/03/14 (from the past 24 hour(s))  Glucose, capillary     Status: Abnormal   Collection Time: 08/03/14 11:38 AM  Result Value Ref Range   Glucose-Capillary 61 (L) 65 - 99 mg/dL  Glucose, capillary     Status: None   Collection Time: 08/03/14 12:27 PM  Result Value Ref Range   Glucose-Capillary 71 65 - 99 mg/dL    Imaging:  US Ob Follow Up  08/03/2014   OBSTETRICAL ULTRASOUND: This exam was performed within a [facility]. The OB US report was generated in the AS system, and faxed to the ordering physician.   This report is available in the [facility]. See the AS Obstetric US report via the Image Link.  US Ob Follow Up  07/06/2014   OBSTETRICAL ULTRASOUND: This exam was performed within a [facility]. The OB US report was generated in the AS system, and faxed to the ordering physician.   This report is available in the [facility]. See the AS Obstetric US report via the Image Link.  US Fetal Bpp W/o Non Stress  08/03/2014   OBSTETRICAL ULTRASOUND: This exam was performed within a [facility]. The OB US report was generated in the AS system, and faxed to the ordering physician.   This report is available in the [facility]. See the AS Obstetric US report via the Image Link.   MAU Course: Distended monitoring 1 hour was reactive, no decelerations. Patient feeling fetal movement throughout MAU visit.   Assessment: 1. Decreased fetal movement in pregnancy  in third trimester, antepartum, fetus 1   2. Maternal pregestational diabetes classes B through R, antepartum   3. Previous cesarean section complicating pregnancy, antepartum condition or complication     Plan: Discharge home in stable condition.  Labor precautions and fetal kick counts Follow-up Information    Follow up with [facility] On 08/07/2014.   Why:  Induction of labor   Contact information:   [address] [id number] [place] [phone]      Follow up with [facility].   Why:  As needed in emergencies   Contact information:   [address] [id number] [place]  [phone]        Medication List    TAKE these medications        ACCU-CHEK AVIVA PLUS W/DEVICE Kit  Use to test blood sugar 6 times daily as instructed. Dx code: O68.912     aspirin EC 81 MG tablet  Take 1 tablet (81 mg total) by mouth daily. Take after 12 weeks for prevention of preeclampssia later in pregnancy     folic acid 1 MG tablet  Commonly known as:  FOLVITE  Take 1 tablet (1 mg total) by mouth daily.     glucose blood test strip  Commonly known as:  ACCU-CHEK AVIVA PLUS  Use to test blood sugar 6 times daily as instructed. Dx code: O24.912     insulin aspart 100 UNIT/ML FlexPen  Commonly known as:  NOVOLOG FLEXPEN  Inject 8-10 Units into the skin 3 (three) times daily with meals. 10 units q am, 8 units with lunch and dinner     insulin NPH Human 100 UNIT/ML injection  Commonly known as:  HUMULIN N  Take 4 units with breakfast 5 units at bedtime - PENS please     Insulin Pen Needle 32G X 4 MM Misc  Commonly known as:  CAREFINE PEN NEEDLES  Use 5x a day     INSULIN SYRINGE .5CC/31GX5/16" 31G X 5/16" 0.5 ML Misc  1 Syringe by Does not apply route 4 (four) times daily as needed.     metFORMIN 500 MG tablet  Commonly known as:   GLUCOPHAGE  Take 1 tablet (500 mg total) by mouth 3 (three) times daily with meals.     ONETOUCH DELICA LANCETS FINE Misc  Use to test blood sugar 6 times daily as instructed.     ACCU-CHEK SOFTCLIX LANCETS lancets  Use to test blood sugar 6 times daily as instructed. Dx code: O49.912     PRENATAL VITAMINS PLUS 27-1 MG Tabs  Take 1 tablet by mouth daily.        [place], [place] 08/03/2014 12:34 PM

## 2014-08-03 NOTE — MAU Note (Signed)
Patient started to notice yesterday morning that there was a decrease in fetal movement. Did not do fetal kick counts. Does feel some movement at 1000 yesterday and still continuing today but is concerned about it being lessened.

## 2014-08-07 ENCOUNTER — Inpatient Hospital Stay (HOSPITAL_COMMUNITY)
Admission: RE | Admit: 2014-08-07 | Discharge: 2014-08-11 | DRG: 775 | Disposition: A | Discharge status: Home / Self Care | Payer: Medicaid Other | Source: Ambulatory Visit | Attending: Family Medicine | Admitting: Family Medicine

## 2014-08-07 ENCOUNTER — Encounter (HOSPITAL_COMMUNITY): Payer: Self-pay

## 2014-08-07 DIAGNOSIS — Z3A39 39 weeks gestation of pregnancy: Secondary | ICD-10-CM | POA: Diagnosis present

## 2014-08-07 DIAGNOSIS — K219 Gastro-esophageal reflux disease without esophagitis: Secondary | ICD-10-CM | POA: Diagnosis present

## 2014-08-07 DIAGNOSIS — O34219 Maternal care for unspecified type scar from previous cesarean delivery: Secondary | ICD-10-CM

## 2014-08-07 DIAGNOSIS — O24424 Gestational diabetes mellitus in childbirth, insulin controlled: Secondary | ICD-10-CM | POA: Diagnosis present

## 2014-08-07 DIAGNOSIS — O0993 Supervision of high risk pregnancy, unspecified, third trimester: Secondary | ICD-10-CM | POA: Diagnosis not present

## 2014-08-07 DIAGNOSIS — O24319 Unspecified pre-existing diabetes mellitus in pregnancy, unspecified trimester: Secondary | ICD-10-CM

## 2014-08-07 DIAGNOSIS — Z833 Family history of diabetes mellitus: Secondary | ICD-10-CM | POA: Diagnosis not present

## 2014-08-07 DIAGNOSIS — O9962 Diseases of the digestive system complicating childbirth: Secondary | ICD-10-CM | POA: Diagnosis present

## 2014-08-07 DIAGNOSIS — O3421 Maternal care for scar from previous cesarean delivery: Secondary | ICD-10-CM | POA: Diagnosis present

## 2014-08-07 LAB — TYPE AND SCREEN
ABO/RH(D): O POS
ANTIBODY SCREEN: NEGATIVE

## 2014-08-07 LAB — GLUCOSE, CAPILLARY
Glucose-Capillary: 81 mg/dL (ref 65–99)
Glucose-Capillary: 38 mg/dL — CL (ref 65–99)
Glucose-Capillary: 86 mg/dL (ref 65–99)
Glucose-Capillary: 171 mg/dL — ABNORMAL HIGH (ref 65–99)
Glucose-Capillary: 71 mg/dL (ref 65–99)

## 2014-08-07 LAB — CBC
HEMATOCRIT: 32 % — AB (ref 36.0–46.0)
Hemoglobin: 10.3 g/dL — ABNORMAL LOW (ref 12.0–15.0)
MCH: 25.6 pg — ABNORMAL LOW (ref 26.0–34.0)
MCHC: 32.2 g/dL (ref 30.0–36.0)
MCV: 79.4 fL (ref 78.0–100.0)
Platelets: 146 10*3/uL — ABNORMAL LOW (ref 150–400)
RBC: 4.03 MIL/uL (ref 3.87–5.11)
RDW: 14.9 % (ref 11.5–15.5)
WBC: 7.5 10*3/uL (ref 4.0–10.5)

## 2014-08-07 LAB — ABO/RH: ABO/RH(D): O POS

## 2014-08-07 LAB — RPR: RPR: NONREACTIVE

## 2014-08-07 MED ORDER — ACETAMINOPHEN 325 MG PO TABS: 650.0000 mg | ORAL_TABLET | ORAL | Status: DC | PRN

## 2014-08-07 MED ORDER — FLEET ENEMA 7-19 GM/118ML RE ENEM: 1.0000 | ENEMA | RECTAL | Status: DC | PRN

## 2014-08-07 MED ORDER — OXYCODONE-ACETAMINOPHEN 5-325 MG PO TABS: 2.0000 | ORAL_TABLET | ORAL | Status: DC | PRN

## 2014-08-07 MED ORDER — LIDOCAINE HCL (PF) 1 % IJ SOLN
30.0000 mL | INTRAMUSCULAR | Status: DC | PRN
  Filled 2014-08-07: qty 30

## 2014-08-07 MED ORDER — OXYCODONE-ACETAMINOPHEN 5-325 MG PO TABS: 1.0000 | ORAL_TABLET | ORAL | Status: DC | PRN

## 2014-08-07 MED ORDER — OXYTOCIN 40 UNITS IN LACTATED RINGERS INFUSION - SIMPLE MED: 62.5000 mL/h | INTRAVENOUS | Status: DC

## 2014-08-07 MED ORDER — LACTATED RINGERS IV SOLN
500.0000 mL | INTRAVENOUS | Status: DC | PRN
Start: 2014-08-07 — End: 2014-08-09

## 2014-08-07 MED ORDER — OXYTOCIN BOLUS FROM INFUSION
500.0000 mL | INTRAVENOUS | Status: DC
  Administered 2014-08-09: 500 mL via INTRAVENOUS

## 2014-08-07 MED ORDER — OXYTOCIN 40 UNITS IN LACTATED RINGERS INFUSION - SIMPLE MED
1.0000 m[IU]/min | INTRAVENOUS | Status: DC
  Administered 2014-08-07: 2 m[IU]/min via INTRAVENOUS
  Filled 2014-08-07: qty 1000

## 2014-08-07 MED ORDER — LACTATED RINGERS IV SOLN
INTRAVENOUS | Status: DC
  Administered 2014-08-07 – 2014-08-08 (×3): via INTRAVENOUS

## 2014-08-07 MED ORDER — CITRIC ACID-SODIUM CITRATE 334-500 MG/5ML PO SOLN: 30.0000 mL | ORAL | Status: DC | PRN

## 2014-08-07 MED ORDER — TERBUTALINE SULFATE 1 MG/ML IJ SOLN: 0.2500 mg | Freq: Once | INTRAMUSCULAR | Status: AC | PRN

## 2014-08-07 MED ORDER — ONDANSETRON HCL 4 MG/2ML IJ SOLN: 4.0000 mg | Freq: Four times a day (QID) | INTRAMUSCULAR | Status: DC | PRN

## 2014-08-07 NOTE — H&P (Signed)
Theresa Koch is a 34 y.o. female presenting for IOL for insulin dependant gestational diabetes. TOLAC. EFW 3950. Maternal Medical History:  Reason for admission: IOL for Insulin dependant diabetes  Contractions: none  Fetal activity: Perceived fetal activity is normal.   Last perceived fetal movement was within the past hour.      OB History    Gravida Para Term Preterm AB TAB SAB Ectopic Multiple Living   Obstetric Comments   Had diabetes during pregnancy. Planned c/s at 38 weeks due to diabetes. Born in [place].     Past Medical History  Diagnosis Date  . Diabetes mellitus without complication    Past Surgical History  Procedure Laterality Date  . Cesarean section     Family History: family history includes Diabetes in her brother, father, and sister; Kidney disease in her father. Social History:  reports that she has never smoked. She has never used smokeless tobacco. She reports that she does not drink alcohol or use illicit drugs.   Prenatal Transfer Tool  Maternal Diabetes: Yes:  Diabetes Type:  Insulin/Medication controlled Genetic Screening: Normal Maternal Ultrasounds/Referrals: Normal Fetal Ultrasounds or other Referrals:  None Maternal Substance Abuse:  No Significant Maternal Medications:  Meds include: Other: insulin Significant Maternal Lab Results:  Lab values include: Other: GDM Other Comments:  None  Review of Systems  Constitutional: Negative.   HENT: Negative.   Eyes: Negative.   Respiratory: Negative.   Cardiovascular: Negative.   Gastrointestinal: Negative.   Genitourinary: Negative.   Musculoskeletal: Negative.   Skin: Negative.   Neurological: Negative.   Endo/Heme/Allergies: Negative.   Psychiatric/Behavioral: Negative.       Blood pressure 117/71, pulse 76, last menstrual period 11/06/2013. Maternal Exam:  Uterine Assessment: No contractions  Abdomen: Patient reports no abdominal tenderness. Surgical scars: low  transverse.   Fetal presentation: vertex  Introitus: Amniotic fluid character: not assessed. Female circ  Pelvis: questionable for delivery.   Cervix: Cervix evaluated by digital exam.     Fetal Exam Fetal Monitor Review: Mode: ultrasound.   Variability: moderate (6-25 bpm).   Pattern: accelerations present.    Fetal State Assessment: Category I - tracings are normal.     Physical Exam  Constitutional: She is oriented to person, place, and time. She appears well-developed and well-nourished.  HENT:  Head: Normocephalic.  Eyes: Pupils are equal, round, and reactive to light.  Neck: Normal range of motion.  Cardiovascular: Normal rate, regular rhythm, normal heart sounds and intact distal pulses.   Respiratory: Effort normal and breath sounds normal.  GI: Soft. Bowel sounds are normal.  Genitourinary: Vagina normal.  Musculoskeletal: Normal range of motion.  Neurological: She is alert and oriented to person, place, and time. She has normal reflexes.  Skin: Skin is warm and dry.  Psychiatric: She has a normal mood and affect. Her behavior is normal. Judgment and thought content normal.    Prenatal labs: ABO, Rh: O/POS/-- (01/18 1157) Antibody: NEG (01/18 1157) Rubella: 9.84 (01/18 1157) RPR: NON REAC (05/16 1201)  HBsAg: NEGATIVE (01/18 1157)  HIV: NONREACTIVE (05/16 1201)  GBS: Negative (07/11 0000)   Assessment/Plan: Admit Consult MD regarding POC SVE cl/post/th/high   Deborh Pense DARLENE 08/07/2014, 8:07 AM

## 2014-08-07 NOTE — Progress Notes (Signed)
Labor Progress Note  S: Feeling well. Here for IOL 2/2 to Diabetes (Type B), insulin dependent and h/o CS  O:  BP 115/72 mmHg  Pulse 69  Temp(Src) 98.1 F (36.7 C) (Oral)  Ht  (1.676 m)  Wt 153 lb (69.4 kg)  BMI 24.71 kg/m2  LMP 11/06/2013 (Exact Date) Cat I CVE: closed/thick/very posterior  A&P: 34 y.o. G2P1001 [redacted]w[redacted]d here for IOL 2/2 to prexisting diabetes effecting pregnancy with h/o CS  #IOL 2/2 DM: Will give low dose pit for cervical ripening given inability to give cytotec and inability to place FB. Patient has never labored and her C-section was for DM at 38wk.  #Diabetes: received her normal AM insulin. Plan for 1/2 evening dose given clear liquid/light labor diet. q4 BG in latent labor and q2 in active labor.   Federico Flake, MD 11:22 AM

## 2014-08-07 NOTE — Plan of Care (Signed)
Problem: Consults Goal: Birthing Suites Patient Information Press F2 to bring up selections list  Outcome: Completed/Met Date Met:  08/07/14  Pt 37-[redacted] weeks EGA and Diabetic  Blood sugars taken every 4 hours   Problem: Phase I Progression Outcomes Goal: Pitocin as ordered Outcome: Completed/Met Date Met:  08/07/14 For IOL and cervical ripening due to history of cesarean section

## 2014-08-08 ENCOUNTER — Encounter (HOSPITAL_COMMUNITY): Payer: Self-pay

## 2014-08-08 LAB — GLUCOSE, CAPILLARY
GLUCOSE-CAPILLARY: 111 mg/dL — AB (ref 65–99)
GLUCOSE-CAPILLARY: 130 mg/dL — AB (ref 65–99)
GLUCOSE-CAPILLARY: 147 mg/dL — AB (ref 65–99)
GLUCOSE-CAPILLARY: 114 mg/dL — AB (ref 65–99)
Glucose-Capillary: 88 mg/dL (ref 65–99)
Glucose-Capillary: 153 mg/dL — ABNORMAL HIGH (ref 65–99)

## 2014-08-08 MED ORDER — DEXTROSE-NACL 5-0.45 % IV SOLN: INTRAVENOUS | Status: DC

## 2014-08-08 MED ORDER — PHENYLEPHRINE 40 MCG/ML (10ML) SYRINGE FOR IV PUSH (FOR BLOOD PRESSURE SUPPORT)
80.0000 ug | PREFILLED_SYRINGE | INTRAVENOUS | Status: DC | PRN
  Filled 2014-08-08: qty 20
  Filled 2014-08-08: qty 2

## 2014-08-08 MED ORDER — DIPHENHYDRAMINE HCL 50 MG/ML IJ SOLN: 12.5000 mg | INTRAMUSCULAR | Status: DC | PRN

## 2014-08-08 MED ORDER — TERBUTALINE SULFATE 1 MG/ML IJ SOLN: 0.2500 mg | Freq: Once | INTRAMUSCULAR | Status: AC | PRN

## 2014-08-08 MED ORDER — SODIUM CHLORIDE 0.9 % IV SOLN: INTRAVENOUS | Status: DC

## 2014-08-08 MED ORDER — INSULIN REGULAR BOLUS VIA INFUSION: 0.0000 [IU] | Freq: Three times a day (TID) | INTRAVENOUS | Status: DC

## 2014-08-08 MED ORDER — OXYTOCIN 40 UNITS IN LACTATED RINGERS INFUSION - SIMPLE MED: 6.0000 m[IU]/min | INTRAVENOUS | Status: DC

## 2014-08-08 MED ORDER — FENTANYL CITRATE (PF) 100 MCG/2ML IJ SOLN
100.0000 ug | INTRAMUSCULAR | Status: DC | PRN
  Administered 2014-08-08 (×2): 100 ug via INTRAVENOUS
  Filled 2014-08-08 (×2): qty 2

## 2014-08-08 MED ORDER — FENTANYL CITRATE (PF) 100 MCG/2ML IJ SOLN
INTRAMUSCULAR | Status: AC
  Administered 2014-08-08: 100 ug
  Filled 2014-08-08: qty 2

## 2014-08-08 MED ORDER — OXYTOCIN 40 UNITS IN LACTATED RINGERS INFUSION - SIMPLE MED: 1.0000 m[IU]/min | INTRAVENOUS | Status: DC

## 2014-08-08 MED ORDER — FENTANYL 2.5 MCG/ML BUPIVACAINE 1/10 % EPIDURAL INFUSION (WH - ANES)
14.0000 mL/h | INTRAMUSCULAR | Status: DC | PRN
Start: 2014-08-08 — End: 2014-08-09
  Administered 2014-08-09: 14 mL/h via EPIDURAL
  Filled 2014-08-08: qty 125

## 2014-08-08 MED ORDER — EPHEDRINE 5 MG/ML INJ
10.0000 mg | INTRAVENOUS | Status: DC | PRN
Start: 2014-08-08 — End: 2014-08-09
  Filled 2014-08-08: qty 2

## 2014-08-08 MED ORDER — FENTANYL CITRATE (PF) 100 MCG/2ML IJ SOLN: 100.0000 ug | Freq: Once | INTRAMUSCULAR | Status: AC

## 2014-08-08 MED ORDER — DEXTROSE 50 % IV SOLN: 25.0000 mL | INTRAVENOUS | Status: DC | PRN

## 2014-08-08 NOTE — Progress Notes (Signed)
Labor Progress Note  S: more uncomfortable with ctx O:  BP 112/60 mmHg  Pulse 66  Temp(Src) 98.1 F (36.7 C) (Oral)  Resp 2  fdBSd$  (1.676 m)  Wt 69.4 kg (153 lb)  BMI 24.71 kg/m2  LMP 11/06/2013 (Exact Date) FHR: baseline 135, moderate variability, + accels, no decels Toco: q3-5 min  FB still in place, did not move with traction  A&P: 34 y.o. G2P1001 [redacted]w[redacted]d IOL 2/2 BDM #labor: cont to increase pitocin, FB still in place  #pain: IV pain meds prn #anticipate NSVD  De Hollingshead, DO 7:50 PM

## 2014-08-08 NOTE — Progress Notes (Signed)
Labor Progress Note  S: more uncomfortable with contractions   O:  BP 122/70 mmHg  Pulse 62  Temp(Src) 97.8 F (36.6 C) (Oral)  Resp 20  Ht  (1.676 m)  Wt 69.4 kg (153 lb)  BMI 24.71 kg/m2  LMP 11/06/2013 (Exact Date) FHR: baseline 125, good variability, +15x15 accels, no decels Toco: UC q2-4 min CVE: Dilation: 1.5 Effacement (%): Thick Cervical Position: Posterior Station: -3 Presentation: Vertex Exam by:: Dr Earlene Plater   Gentle traction was applied, along with cervical exam --> FB still in place  A&P: 34 y.o. G2P1001 [redacted]w[redacted]d IOL 2/2 BDM  #labor: continue to increase pitocin 1x1, pt developing better contraction pattern, FB still in place  #DM: blood sugars have been stable  #pain: IV pain med prn, epidural when in active labor  #FWB: Cat I #anticipate NSVD   De Hollingshead, DO 4:44 PM

## 2014-08-08 NOTE — Progress Notes (Signed)
Theresa Koch is a 34 y.o. G2P1001 at [redacted]w[redacted]d admitted for induction of labor due to Diabetes.  Subjective: Breathing w/ ctx  Objective: BP 120/71 mmHg  Pulse 70  Temp(Src) 98.2 F (36.8 C) (Oral)  Resp 20  Ht  (1.676 m)  Wt 69.4 kg (153 lb)  BMI 24.71 kg/m2  LMP 11/06/2013 (Exact Date)      FHT:  FHR: 135-145 bpm, variability: moderate,  accelerations:  Present,  decelerations:  Absent UC:   regular, every 2 minutes w/ Pit @ 79mu/min SVE:  4-5/90/-2; foley bulb in vag canal on exam  Labs: Lab Results  Component Value Date   WBC 7.5 08/07/2014   HGB 10.3* 08/07/2014   HCT 32.0* 08/07/2014   MCV 79.4 08/07/2014   PLT 146* 08/07/2014   CBG: 114  Assessment / Plan: IOL process/early labor DM- B  Offered for pt to have a break from Pitocin, but she desires to continue w/ IOL process Will give Fentanyl for pain  Emiliya Chretien CNM 08/08/2014, 9:11 PM

## 2014-08-08 NOTE — Progress Notes (Signed)
Labor Progress Note  S: feeling contractions, patient does not wish to take pitocin break to eat meal   O:  BP 116/60 mmHg  Pulse 69  Temp(Src) 98 F (36.7 C) (Oral)  Resp 20  Ht  (1.676 m)  Wt 69.4 kg (153 lb)  BMI 24.71 kg/m2  LMP 11/06/2013 (Exact Date) FHR: baseline 135, good variability, +15x15 accels, no decels Toco: UC q4-5 min CVE: Dilation: 1.5 Effacement (%): 20 Cervical Position: Posterior Station: -3 Presentation: Vertex Exam by:: Dr. Alvester Morin   1300: FB placed   A&P: 34 y.o. G2P1001 [redacted]w[redacted]d IOL 2/2 BDM  #labor: FB in place and will do pitocin 1x1 #FWB Cat I #Anticipate NSVD   De Hollingshead, DO 1:18 PM

## 2014-08-08 NOTE — Anesthesia Preprocedure Evaluation (Signed)
Anesthesia Evaluation  Patient identified by MRN, date of birth, ID band Patient awake    Reviewed: Allergy & Precautions, Patient's Chart, lab work & pertinent test results  Airway Mallampati: II  TM Distance: >3 FB Neck ROM: Full    Dental no notable dental hx. (+) Teeth Intact   Pulmonary neg pulmonary ROS,  breath sounds clear to auscultation  Pulmonary exam normal       Cardiovascular negative cardio ROS Normal cardiovascular examRhythm:Regular Rate:Normal     Neuro/Psych negative neurological ROS  negative psych ROS   GI/Hepatic Neg liver ROS, GERD-  ,  Endo/Other  diabetes, Well Controlled, Gestational, Oral Hypoglycemic Agents, Insulin Dependent  Renal/GU negative Renal ROS  negative genitourinary   Musculoskeletal negative musculoskeletal ROS (+)   Abdominal (+) + obese,   Peds  Hematology  (+) anemia , Thrombocytopenia - mild   Anesthesia Other Findings   Reproductive/Obstetrics (+) Pregnancy Previous C/Section Attempting TOLAC                             Anesthesia Physical Anesthesia Plan  ASA: II  Anesthesia Plan: Epidural   Post-op Pain Management:    Induction:   Airway Management Planned: Natural Airway  Additional Equipment:   Intra-op Plan:   Post-operative Plan:   Informed Consent: I have reviewed the patients History and Physical, chart, labs and discussed the procedure including the risks, benefits and alternatives for the proposed anesthesia with the patient or authorized representative who has indicated his/her understanding and acceptance.     Plan Discussed with: Anesthesiologist  Anesthesia Plan Comments:         Anesthesia Quick Evaluation

## 2014-08-09 ENCOUNTER — Inpatient Hospital Stay (HOSPITAL_COMMUNITY): Payer: Medicaid Other | Admitting: Anesthesiology

## 2014-08-09 ENCOUNTER — Encounter (HOSPITAL_COMMUNITY): Payer: Self-pay

## 2014-08-09 DIAGNOSIS — O24424 Gestational diabetes mellitus in childbirth, insulin controlled: Secondary | ICD-10-CM

## 2014-08-09 DIAGNOSIS — Z3A39 39 weeks gestation of pregnancy: Secondary | ICD-10-CM

## 2014-08-09 LAB — GLUCOSE, CAPILLARY
GLUCOSE-CAPILLARY: 183 mg/dL — AB (ref 65–99)
GLUCOSE-CAPILLARY: 120 mg/dL — AB (ref 65–99)
Glucose-Capillary: 149 mg/dL — ABNORMAL HIGH (ref 65–99)
Glucose-Capillary: 165 mg/dL — ABNORMAL HIGH (ref 65–99)
Glucose-Capillary: 159 mg/dL — ABNORMAL HIGH (ref 65–99)

## 2014-08-09 MED ORDER — INSULIN ASPART 100 UNIT/ML ~~LOC~~ SOLN
3.0000 [IU] | Freq: Three times a day (TID) | SUBCUTANEOUS | Status: DC
  Administered 2014-08-09: 3 [IU] via SUBCUTANEOUS

## 2014-08-09 MED ORDER — ZOLPIDEM TARTRATE 5 MG PO TABS: 5.0000 mg | ORAL_TABLET | Freq: Every evening | ORAL | Status: DC | PRN

## 2014-08-09 MED ORDER — IBUPROFEN 600 MG PO TABS
600.0000 mg | ORAL_TABLET | Freq: Four times a day (QID) | ORAL | Status: DC
  Administered 2014-08-09 – 2014-08-11 (×6): 600 mg via ORAL
  Filled 2014-08-09 (×10): qty 1

## 2014-08-09 MED ORDER — INSULIN ASPART 100 UNIT/ML ~~LOC~~ SOLN
0.0000 [IU] | Freq: Three times a day (TID) | SUBCUTANEOUS | Status: DC
  Administered 2014-08-10: 2 [IU] via SUBCUTANEOUS

## 2014-08-09 MED ORDER — DEXTROSE IN LACTATED RINGERS 5 % IV SOLN: INTRAVENOUS | Status: DC

## 2014-08-09 MED ORDER — BENZOCAINE-MENTHOL 20-0.5 % EX AERO
1.0000 "application " | INHALATION_SPRAY | CUTANEOUS | Status: DC | PRN
  Filled 2014-08-09: qty 56

## 2014-08-09 MED ORDER — TETANUS-DIPHTH-ACELL PERTUSSIS 5-2.5-18.5 LF-MCG/0.5 IM SUSP: 0.5000 mL | Freq: Once | INTRAMUSCULAR | Status: DC

## 2014-08-09 MED ORDER — INSULIN ASPART 100 UNIT/ML ~~LOC~~ SOLN
0.0000 [IU] | Freq: Every day | SUBCUTANEOUS | Status: DC
  Administered 2014-08-09: 1 [IU] via SUBCUTANEOUS

## 2014-08-09 MED ORDER — LANOLIN HYDROUS EX OINT: TOPICAL_OINTMENT | CUTANEOUS | Status: DC | PRN

## 2014-08-09 MED ORDER — INSULIN ASPART 100 UNIT/ML FLEXPEN: 4.0000 [IU] | PEN_INJECTOR | Freq: Three times a day (TID) | SUBCUTANEOUS | Status: DC

## 2014-08-09 MED ORDER — METFORMIN HCL ER 500 MG PO TB24
2000.0000 mg | ORAL_TABLET | Freq: Every day | ORAL | Status: DC
  Filled 2014-08-09: qty 4

## 2014-08-09 MED ORDER — DIPHENHYDRAMINE HCL 25 MG PO CAPS: 25.0000 mg | ORAL_CAPSULE | Freq: Four times a day (QID) | ORAL | Status: DC | PRN

## 2014-08-09 MED ORDER — LIDOCAINE HCL (PF) 1 % IJ SOLN
INTRAMUSCULAR | Status: DC | PRN
Start: 2014-08-09 — End: 2014-08-09
  Administered 2014-08-09 (×2): 4 mL via EPIDURAL

## 2014-08-09 MED ORDER — INSULIN ASPART 100 UNIT/ML ~~LOC~~ SOLN: 0.0000 [IU] | Freq: Every day | SUBCUTANEOUS | Status: DC

## 2014-08-09 MED ORDER — INSULIN REGULAR HUMAN 100 UNIT/ML IJ SOLN
4.0000 [IU] | Freq: Once | INTRAMUSCULAR | Status: AC
  Administered 2014-08-09: 4 [IU] via SUBCUTANEOUS
  Filled 2014-08-09: qty 0.04

## 2014-08-09 MED ORDER — ONDANSETRON HCL 4 MG/2ML IJ SOLN: 4.0000 mg | INTRAMUSCULAR | Status: DC | PRN

## 2014-08-09 MED ORDER — INSULIN ASPART 100 UNIT/ML ~~LOC~~ SOLN
0.0000 [IU] | Freq: Three times a day (TID) | SUBCUTANEOUS | Status: DC
Start: 2014-08-09 — End: 2014-08-09

## 2014-08-09 MED ORDER — INSULIN ASPART 100 UNIT/ML ~~LOC~~ SOLN
3.0000 [IU] | Freq: Three times a day (TID) | SUBCUTANEOUS | Status: DC
  Administered 2014-08-10: 3 [IU] via SUBCUTANEOUS

## 2014-08-09 MED ORDER — OXYCODONE-ACETAMINOPHEN 5-325 MG PO TABS
2.0000 | ORAL_TABLET | ORAL | Status: DC | PRN
  Administered 2014-08-11: 2 via ORAL
  Filled 2014-08-09: qty 2

## 2014-08-09 MED ORDER — SIMETHICONE 80 MG PO CHEW: 80.0000 mg | CHEWABLE_TABLET | ORAL | Status: DC | PRN

## 2014-08-09 MED ORDER — METFORMIN HCL 500 MG PO TABS
500.0000 mg | ORAL_TABLET | Freq: Three times a day (TID) | ORAL | Status: DC
  Filled 2014-08-09 (×2): qty 1

## 2014-08-09 MED ORDER — SODIUM CHLORIDE 0.9 % IV SOLN
INTRAVENOUS | Status: DC
  Filled 2014-08-09: qty 2.5

## 2014-08-09 MED ORDER — PHENYLEPHRINE 40 MCG/ML (10ML) SYRINGE FOR IV PUSH (FOR BLOOD PRESSURE SUPPORT)
80.0000 ug | PREFILLED_SYRINGE | INTRAVENOUS | Status: DC | PRN
Start: 2014-08-09 — End: 2014-08-09

## 2014-08-09 MED ORDER — DIPHENHYDRAMINE HCL 50 MG/ML IJ SOLN: 12.5000 mg | INTRAMUSCULAR | Status: DC | PRN

## 2014-08-09 MED ORDER — METFORMIN HCL ER 750 MG PO TB24
2000.0000 mg | ORAL_TABLET | Freq: Every day | ORAL | Status: DC
  Administered 2014-08-09: 2000 mg via ORAL
  Filled 2014-08-09 (×2): qty 1

## 2014-08-09 MED ORDER — ONDANSETRON HCL 4 MG PO TABS: 4.0000 mg | ORAL_TABLET | ORAL | Status: DC | PRN

## 2014-08-09 MED ORDER — SENNOSIDES-DOCUSATE SODIUM 8.6-50 MG PO TABS
2.0000 | ORAL_TABLET | ORAL | Status: DC
  Administered 2014-08-11: 2 via ORAL
  Filled 2014-08-09 (×2): qty 2

## 2014-08-09 MED ORDER — INSULIN NPH (HUMAN) (ISOPHANE) 100 UNIT/ML ~~LOC~~ SUSP: 2.0000 [IU] | Freq: Two times a day (BID) | SUBCUTANEOUS | Status: DC

## 2014-08-09 MED ORDER — DIBUCAINE 1 % RE OINT: 1.0000 "application " | TOPICAL_OINTMENT | RECTAL | Status: DC | PRN

## 2014-08-09 MED ORDER — ACETAMINOPHEN 325 MG PO TABS
650.0000 mg | ORAL_TABLET | ORAL | Status: DC | PRN
  Administered 2014-08-10: 650 mg via ORAL
  Filled 2014-08-09 (×2): qty 2

## 2014-08-09 MED ORDER — PRENATAL MULTIVITAMIN CH
1.0000 | ORAL_TABLET | Freq: Every day | ORAL | Status: DC
  Administered 2014-08-09 – 2014-08-10 (×2): 1 via ORAL
  Filled 2014-08-09 (×2): qty 1

## 2014-08-09 MED ORDER — WITCH HAZEL-GLYCERIN EX PADS: 1.0000 "application " | MEDICATED_PAD | CUTANEOUS | Status: DC | PRN

## 2014-08-09 MED ORDER — OXYCODONE-ACETAMINOPHEN 5-325 MG PO TABS: 1.0000 | ORAL_TABLET | ORAL | Status: DC | PRN

## 2014-08-09 MED ORDER — FENTANYL 2.5 MCG/ML BUPIVACAINE 1/10 % EPIDURAL INFUSION (WH - ANES): 14.0000 mL/h | INTRAMUSCULAR | Status: DC | PRN

## 2014-08-09 NOTE — Plan of Care (Signed)
Problem: Consults Goal: Diabetes Guidelines if Diabetic/Glucose > 140 If diabetic or lab glucose is > 140 mg/dl - Initiate Diabetes/Hyperglycemia Guidelines & Document Interventions  Outcome: Progressing Diabetic consult done 8/3 at 1600.

## 2014-08-09 NOTE — Anesthesia Procedure Notes (Signed)
Epidural Patient location during procedure: OB Start time: 08/09/2014 12:10 AM  Staffing Anesthesiologist: Mal Amabile Performed by: anesthesiologist   Preanesthetic Checklist Completed: patient identified, site marked, surgical consent, pre-op evaluation, timeout performed, IV checked, risks and benefits discussed and monitors and equipment checked  Epidural Patient position: sitting Prep: site prepped and draped and DuraPrep Patient monitoring: continuous pulse ox and blood pressure Approach: midline Location: L3-L4 Injection technique: LOR air  Needle:  Needle type: Tuohy  Needle gauge: 17 G Needle length: 9 cm and 9 Needle insertion depth: 5 cm cm Catheter type: closed end flexible Catheter size: 19 Gauge Catheter at skin depth: 10 cm Test dose: negative and Other  Assessment Events: blood not aspirated, injection not painful, no injection resistance, negative IV test and no paresthesia  Additional Notes Patient identified. Risks and benefits discussed including failed block, incomplete  Pain control, post dural puncture headache, nerve damage, paralysis, blood pressure Changes, nausea, vomiting, reactions to medications-both toxic and allergic and post Partum back pain. All questions were answered. Patient expressed understanding and wished to proceed. Sterile technique was used throughout procedure. Epidural site was Dressed with sterile barrier dressing. No paresthesias, signs of intravascular injection Or signs of intrathecal spread were encountered. Attempt x 2. 1st attempt heme in catheter. Patient was more comfortable after the epidural was dosed. Please see RN's note for documentation of vital signs and FHR which are stable.

## 2014-08-09 NOTE — Progress Notes (Signed)
Drenda Freeze nurse midwife notified or sliding scale at bedtime that Dr. Adrian Blackwater wrote this afternoon for pt. We think their is typo and 1unit should be the range above the 2 unit list. Drenda Freeze will call pharmacy to change dose as she does not know how to change in epic.

## 2014-08-09 NOTE — Lactation Note (Signed)
This note was copied from the chart of Theresa Koch. Lactation Consultation Note  Experienced BF mother reports BF is going well.  Encouraged her to call for latch assistance as needed and LATCH score every 8 hours.  Has information on support group and OP services.  Patient Name: Theresa Paris Hohn [id number] Date: 08/09/2014 Reason for consult: Initial assessment   Maternal Data Has patient been taught Hand Expression?: Yes Does the patient have breastfeeding experience prior to this delivery?: Yes  Feeding    LATCH Score/Interventions                      Lactation Tools Discussed/Used     Consult Status Consult Status: Follow-up Follow-up type: Call as needed    Soyla Dryer 08/09/2014, 11:56 AM

## 2014-08-09 NOTE — Anesthesia Postprocedure Evaluation (Signed)
  Anesthesia Post-op Note  Patient: Theresa Koch  Procedure(s) Performed: * No procedures listed *  Patient Location: PACU and Mother/Baby  Anesthesia Type:Epidural  Level of Consciousness: awake, alert  and oriented  Airway and Oxygen Therapy: Patient Spontanous Breathing  Post-op Pain: mild  Post-op Assessment: Post-op Vital signs reviewed, Patient's Cardiovascular Status Stable, Respiratory Function Stable, No signs of Nausea or vomiting, Adequate PO intake, Pain level controlled, No headache, No backache and Patient able to bend at knees              Post-op Vital Signs: Reviewed and stable  Last Vitals:  Filed Vitals:   08/09/14 0640  BP: 94/57  Pulse: 77  Temp: 37 C  Resp: 18    Complications: No apparent anesthesia complications

## 2014-08-09 NOTE — Progress Notes (Signed)
Theresa Koch is a 34 y.o. G2P1001 at [redacted]w[redacted]d  admitted for induction of labor due to Diabetes.  Subjective: Mostly comfortable w/ epidural; recently leaking fluid (SROM); IV site blew and unable to get Glucostabilizer started  Objective: BP 116/66 mmHg  Pulse 67  Temp(Src) 97.7 F (36.5 C) (Oral)  Resp 20  Ht  (1.676 m)  Wt 69.4 kg (153 lb)  BMI 24.71 kg/m2  SpO2 100%  LMP 11/06/2013 (Exact Date)      FHT:  FHR: 140s bpm, variability: moderate,  accelerations:  Present,  decelerations:  Absent UC:   regular, every 2-3 minutes w/ 41mu/min Pit SVE:   Dilation: 10 Effacement (%): 100 Station: +2 Exam by:: Bertram Millard, RN  Labs: Lab Results  Component Value Date   WBC 7.5 08/07/2014   HGB 10.3* 08/07/2014   HCT 32.0* 08/07/2014   MCV 79.4 08/07/2014   PLT 146* 08/07/2014   CBG: 165  Assessment / Plan: End 1st stage DM- B  Will give reg ins 4u Begin pushing w/ urge  [name], [name] CNM 08/09/2014, 1:27 AM

## 2014-08-09 NOTE — Progress Notes (Signed)
cbg at 12 was 183 there was no sliding scale ordered at the time for coverage. Discussed with MD on call. Sliding scale oredered for 1700...Marland KitchenMarland KitchenMarland Kitchen

## 2014-08-10 LAB — GLUCOSE, CAPILLARY
GLUCOSE-CAPILLARY: 152 mg/dL — AB (ref 65–99)
GLUCOSE-CAPILLARY: 102 mg/dL — AB (ref 65–99)
Glucose-Capillary: 125 mg/dL — ABNORMAL HIGH (ref 65–99)

## 2014-08-10 MED ORDER — METFORMIN HCL ER 500 MG PO TB24
500.0000 mg | ORAL_TABLET | Freq: Three times a day (TID) | ORAL | Status: DC
  Administered 2014-08-10 – 2014-08-11 (×4): 500 mg via ORAL
  Filled 2014-08-10 (×3): qty 1

## 2014-08-10 MED ORDER — INSULIN NPH (HUMAN) (ISOPHANE) 100 UNIT/ML ~~LOC~~ SUSP
4.0000 [IU] | Freq: Two times a day (BID) | SUBCUTANEOUS | Status: DC
  Administered 2014-08-10 – 2014-08-11 (×2): 4 [IU] via SUBCUTANEOUS
  Filled 2014-08-10: qty 10

## 2014-08-10 NOTE — Clinical Documentation Improvement (Signed)
   Please specify the degree of laceration treated.   Delivery Note:  Sign & Symptoms:Lacerations: R labial & vaginal Suture Repair: 3.0 vicryl    Thank you,  Andy Gauss ,RN Clinical Documentation Specialist:  [phone]  [facility]- Health Information Management [email or web address]

## 2014-08-10 NOTE — Progress Notes (Signed)
Post Partum Day #1  Subjective:  Theresa Koch is a 34 y.o. F6O1308 [redacted]w[redacted]d s/p VBAC.  No acute events overnight.  Pt denies problems with ambulating, voiding or po intake.  She denies nausea or vomiting.  Pain is well controlled. Reports some sternal pain that began after eating. Reports diminished sensation to right interior thigh. She has had flatus. She has not had bowel movement.  Lochia Minimal.  Plan for birth control is IUD, vs. Nexplanon.  Method of Feeding: Breast  Objective: BP 107/67 mmHg  Pulse 69  Temp(Src) 98.3 F (36.8 C) (Oral)  Resp 15  Ht  (1.676 m)  Wt 69.4 kg (153 lb)  BMI 24.71 kg/m2  SpO2 100%  LMP 11/06/2013 (Exact Date)  Breastfeeding? Unknown  Physical Exam:  General: alert, cooperative and no distress Lochia:normal flow Chest: CTAB, no tenderness to palpation Heart: RRR no m/r/g Abdomen: +BS, soft, nontender, fundus firm at/below umbilicus Uterine Fundus: firm, midline DVT Evaluation: No evidence of DVT seen on physical exam. Extremities: no edema, pulses +2 bilaterally. Diminished sensation on internal thigh from groin to knee  No results for input(s): HGB, HCT in the last 72 hours.        Ref Range 8:13 AM (08/10/14) 1d ago (08/09/14) 1d ago (08/09/14) 1d ago (08/09/14)    Glucose-Capillary 65 - 99 mg/dL 657 (H) 846 (H) 962 (H) 183 (H)        Received 6 units of novolog in past 24hrs.   Assessment/Plan:  ASSESSMENT: Theresa Koch is a 33 y.o. [id number] [redacted]w[redacted]d ppd #1 s/p VDAC doing well.   Sternal pain with onset after food, normal CV exam, and non-reproducable with palpation is suggestive of GERD. Counseled patient to ask for medication during episodes to determine pain relief. Continue to monitor.   Currently undecided on Nexplanon vs Mirena. Will provide patient with LARC information.  Will consult with Diabetes Coordinator on medication regimen. Currently on metformin XR  qd and SS insulin.    Plan for discharge tomorrow, Breastfeeding  and Lactation consult   LOS: 3 days   Theresa Koch 08/10/2014, 9:44 AM   OB fellow attestation Post Partum Day 1 I have seen and examined this patient and agree with above documentation in the medical student's note.   Theresa Koch is a 34 y.o. K4M0102 s/p NSVD after IOL for diabetes.  Pt denies problems with ambulating, voiding or po intake. Pain is well controlled.  Plan for birth control is Unsure.  Method of Feeding: Breast  Reports pain at the base of her sternum. She reports aggravation with food and occurred the first time after eating. Does not hurt when touched. No RUQ pain  PE:  BP 107/67 mmHg  Pulse 69  Temp(Src) 98.3 F (36.8 C) (Oral)  Resp 15  Ht  (1.676 m)  Wt 153 lb (69.4 kg)  BMI 24.71 kg/m2  SpO2 100%  LMP 11/06/2013 (Exact Date)  Breastfeeding? Unknown Fundus firm  Plan for discharge: PPD#2 #Sternal Pain: suspect GERD, encouraged to use antiacid prn and continued to monitor #PPD care: routine care. Continued to address contraception plan.  #Diabetes: Slightly elevated glucose levels. Will continued to monitor and adjust insulin as needed. Possibly needs more basal coverage given fasting of 152.   Federico Flake, MD 10:46 AM

## 2014-08-10 NOTE — Progress Notes (Signed)
Called to patient's room by spouse.  Patient complaining of a  headache.  Asked patient if she wanted her Ibuprofen now and since she didn't take the 1800 dose she said yes.   Asked patient if she had ordered any dinner since we have to take a blood sugar prior to her meal.  Patient stated she ate something around 7:00 PM or 7:30 PM and didn't call out.  Advised patient that she would have a blood sugar around 2200

## 2014-08-10 NOTE — Progress Notes (Signed)
Inpatient Diabetes Program Recommendations  AACE/ADA: New Consensus Statement on Inpatient Glycemic Control (2013)  Target Ranges:  Prepandial:   less than 140 mg/dL      Peak postprandial:   less than 180 mg/dL (1-2 hours)      Critically ill patients:  140 - 180 mg/dL   Results for Theresa Koch, Theresa Koch (MRN [id number]) as of 08/10/2014 07:58  Ref. Range 08/08/2014 15:17 08/08/2014 19:05 08/08/2014 21:12 08/08/2014 23:37 08/09/2014 01:07 08/09/2014 12:05 08/09/2014 17:18 08/09/2014 22:04  Glucose-Capillary Latest Ref Range: 65-99 mg/dL 956 (H) 213 (H) 086 (H) 149 (H) 165 (H) 183 (H) 120 (H) 159 (H)    Diabetes history: DM2 (dx 2011; insulin dependent since 2013)  Current orders for Inpatient glycemic control: Metformin XR 2000 mg QAM, Novolog 0-9 units TID with meals, Novolog 0-5 units HS, Novolog 3 units TID with meals for meal coverage  Inpatient Diabetes Program Recommendations Insulin - Basal: Per Dr. Charlean Sanfilippo post delivery recommendations, please consider ordering NPH 4 units BID. Oral Agents: Per Dr. Charlean Sanfilippo post delivery recommendations, please consider changing Metfromin XR to 500 mg TID with meals.  Note: Noted consult for diabetes coordinator. In reviewing the chart noted patient is followed by Dr. Elvera Lennox and last seen Dr. Elvera Lennox on 07/11/2014. Per Dr. Charlean Sanfilippo office note on 07/11/2014 she provided post delivery recommendations as follows: After you give birth, Please change to: - NPH 4 units 2x a day - NovoLog 4 units 3x a day - Metformin XR 500 mg 3x a day  You may stop the NovoLog if the sugars are at goal after the first day postpartum.   Please return in 1.5 month with your sugar log.  Thanks, Orlando Penner, RN, MSN, CCRN, CDE Diabetes Coordinator Inpatient Diabetes Program [phone] (Team Pager from 8am to 5pm) [phone] ([facility] office) [phone] [facility] office) [phone] [facility] office)

## 2014-08-11 LAB — GLUCOSE, CAPILLARY
GLUCOSE-CAPILLARY: 123 mg/dL — AB (ref 65–99)
Glucose-Capillary: 128 mg/dL — ABNORMAL HIGH (ref 65–99)

## 2014-08-11 MED ORDER — BUTALBITAL-APAP-CAFFEINE 50-325-40 MG PO CAPS: 1.0000 | ORAL_CAPSULE | Freq: Four times a day (QID) | ORAL | Status: DC | PRN

## 2014-08-11 MED ORDER — IBUPROFEN 600 MG PO TABS: 600.0000 mg | ORAL_TABLET | Freq: Four times a day (QID) | ORAL | Status: DC

## 2014-08-11 MED ORDER — METFORMIN HCL ER 500 MG PO TB24: 500.0000 mg | ORAL_TABLET | Freq: Three times a day (TID) | ORAL | Status: DC

## 2014-08-11 MED ORDER — INSULIN NPH (HUMAN) (ISOPHANE) 100 UNIT/ML ~~LOC~~ SUSP: 4.0000 [IU] | Freq: Two times a day (BID) | SUBCUTANEOUS | Status: DC

## 2014-08-11 NOTE — Discharge Summary (Signed)
Obstetric Discharge Summary Reason for Admission: induction of labor Prenatal Procedures: NST, Growth [place] Intrapartum Procedures: spontaneous vaginal delivery Postpartum Procedures: none Complications-Operative and Postpartum: vaginal laceration HEMOGLOBIN  Date Value Ref Range Status  08/07/2014 10.3* 12.0 - 15.0 g/dL Final   HCT  Date Value Ref Range Status  08/07/2014 32.0* 36.0 - 46.0 % Final   Delivery Note  At 2:54 AM a viable female was delivered via Vaginal, Spontaneous Delivery (Presentation: ; Occiput Anterior). APGAR: 9, 9; weight: pending.  Placenta status: Intact, Spontaneous. Cord: 3 vessels  Anesthesia: Epidural  Episiotomy: None  Lacerations: R labial & vaginal  Suture Repair: 3.0 vicryl  Est. Blood Loss (mL): 529  Mom to postpartum. Baby to Couplet care / Skin to Skin.  Theresa Koch CNM  08/09/2014, 3:59 AM  Patient has uncomplicated postpartum period. She developed a tension type headache on PPD#2. She denied vision changes, numbness/tingling, RUQ pain, N/V, swelling and her BP was wnl. The patient was offered toradol vs home with rx for Fioricet. Patient opted for prescription and was discharged home.   Physical Exam:  General: alert and cooperative Lochia: appropriate Uterine Fundus: firm Incision: NA DVT Evaluation: No evidence of DVT seen on physical exam. Neuro: CN2-12 grossly intact. Moves all ext. Able to ambulate  Discharge Diagnoses: Term Pregnancy-delivered  Discharge Information: Date: 08/11/2014 Activity: pelvic rest Diet: routine Medications: Ibuprofen and Fioricet Condition: stable Instructions: refer to practice specific booklet Discharge to: home   Newborn Data: Live born female  Birth Weight: 7 lb 9.2 oz (3436 g) APGAR: 9, 9  Home with mother.  Theresa Koch [facility] 08/11/2014, 7:46 AM

## 2014-08-29 ENCOUNTER — Encounter: Payer: Self-pay | Admitting: Internal Medicine

## 2014-08-29 ENCOUNTER — Ambulatory Visit (INDEPENDENT_AMBULATORY_CARE_PROVIDER_SITE_OTHER): Payer: Medicaid Other | Admitting: Internal Medicine

## 2014-08-29 ENCOUNTER — Other Ambulatory Visit (INDEPENDENT_AMBULATORY_CARE_PROVIDER_SITE_OTHER): Payer: Medicaid Other | Admitting: *Deleted

## 2014-08-29 VITALS — BP 110/62 | HR 82 | Temp 98.0°F | Resp 12 | Wt 137.0 lb

## 2014-08-29 DIAGNOSIS — O24912 Unspecified diabetes mellitus in pregnancy, second trimester: Secondary | ICD-10-CM

## 2014-08-29 DIAGNOSIS — E1165 Type 2 diabetes mellitus with hyperglycemia: Secondary | ICD-10-CM | POA: Diagnosis not present

## 2014-08-29 LAB — POCT GLYCOSYLATED HEMOGLOBIN (HGB A1C): HEMOGLOBIN A1C: 6.5

## 2014-08-29 MED ORDER — ACCU-CHEK SOFTCLIX LANCETS MISC: Status: DC

## 2014-08-29 MED ORDER — INSULIN GLARGINE 100 UNIT/ML SOLOSTAR PEN: 7.0000 [IU] | PEN_INJECTOR | Freq: Every day | SUBCUTANEOUS | Status: DC

## 2014-08-29 MED ORDER — GLIPIZIDE ER 2.5 MG PO TB24: 5.0000 mg | ORAL_TABLET | Freq: Every day | ORAL | Status: DC

## 2014-08-29 MED ORDER — GLUCOSE BLOOD VI STRP: ORAL_STRIP | Status: DC

## 2014-08-29 NOTE — Progress Notes (Signed)
Patient ID: Theresa Koch, female   DOB: 06-28-1980, 34 y.o.   MRN: [id number]  HPI: Theresa Koch is a 34 y.o.-year-old female, initially referred by Dr. Synetta Shadow, returning for f/u for DM2, dx in 2011, insulin-dependent since end 2013, uncontrolled, without complications. She is 3 weeks postpartum. She also has a 34 y/o daughter. Last visit 1.5 mo ago.  She gave birth on 08/07/2014. Daughter is healthy. She is breastfeeding. No low CBG with breastfeeding.  Last hemoglobin A1c was: Lab Results  Component Value Date   HGBA1C 6.5 08/29/2014   HGBA1C 6.2 05/12/2014   HGBA1C 7.6* 02/14/2014   After pregnancy, she is on: - NPH 4 units 2x a day - Metformin XR 500 mg 3x Used some NovoLog if sugars were high.  Pt checks her sugars 2-3x a day and they are higher: - am: 58-80 >> 59, 75-95, 113 >> 73-117, 128 >> 95-140, 152 >> 69-107, 121 >> 78-139, 206 - 2h after b'fast: 72-157, 209 >> 110-211, 264 >> 71-174 >> 60 (novolog)-180, 197 - before lunch: n/c >> 53-116 >>  59, 66-113 >> 64-102 >> 74-140 >> 54-118, 127 >> 117, 151, 250 - 2h after lunch: 97-169, 221 >> 65, 86, 105-192 >> 84-134 >> 107-228, 267 >> 70-112 >> 150-222 - before dinner:76, 86-135, 153, 180 >> 78-139, 163 >> 71-171 >> 57-104, 183 >> 95, 102 - 2h after dinner: 134-179 >> 93-159, 185 >> 109-167, 182 >> 133-266 >> 63-160, 189 >> 187-314 - bedtime: n/c >> 148 >> 185 >> 50-134 >> 71 - nighttime: 56, 70-90 >> 79, 91 >> n/c >> 62-134, 172 >> n/c No lows. Lowest sugar was 46 >> 53 >> 59 >> 64 >> 71 >> 34 (6 units of NPH) >> 78; she has hypoglycemia awareness at 60.  Highest sugar was 221 >> 217x1 >> 209 >> 238x1 >> 314  Glucometer: One Touch Ultra  Pt's meals are: - Breakfast: tea with milk (nausea)  - Lunch: salad, soup, cheese - takes 70/30 before this - Dinner: egg, cheese, sometimes cereals - Snacks:   - no CKD, no BUN/creatinine: Lab Results  Component Value Date   BUN 8 01/23/2014   Lab Results  Component Value  Date   CREATININE 0.44* 01/23/2014   - no h/o HL No results found for: CHOL, HDL, LDLCALC, LDLDIRECT, TRIG, CHOLHDL  - last eye exam was on 03/20/2014. No DR.  - no numbness and tingling in her feet.  ROS: Constitutional: no weight gain/loss, no fatigue, no subjective hyperthermia, no nocturia  Eyes: no blurry vision, no xerophthalmia ENT: no sore throat, no nodules palpated in throat, no dysphagia/odynophagia, no hoarseness Cardiovascular: no CP/SOB/palpitations/leg swelling Respiratory: no cough/SOB Gastrointestinal: no N/V/D/C Musculoskeletal: no muscle/joint aches Skin: no rashes Neurological: no tremors/numbness/tingling/dizziness  I reviewed pt's medications, allergies, PMH, social hx, family hx, and changes were documented in the history of present illness. Otherwise, unchanged from my initial visit note:  Past Medical History  Diagnosis Date  . Diabetes mellitus without complication    Past Surgical History  Procedure Laterality Date  . Cesarean section     History   Social History  . Marital Status: Married    Spouse Name: N/A    Number of Children: 1   Occupational History  . 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 Smoking status: Never Smoker   . Smokeless tobacco: Not on file  . Alcohol Use: No  . Drug Use: No  Current Outpatient Prescriptions on File Prior to Visit  Medication Sig Dispense Refill  . ACCU-CHEK SOFTCLIX LANCETS lancets Use to test blood sugar 6 times daily as instructed. Dx code: Q73.419 200 each 3  . Blood Glucose Monitoring Suppl (ACCU-CHEK AVIVA PLUS) W/DEVICE KIT Use to test blood sugar 6 times daily as instructed. Dx code: F79.024 1 kit 0  . Butalbital-APAP-Caffeine 50-325-40 MG per capsule Take 1-2 capsules by mouth every 6 (six) hours as needed for headache. 2 capsule 1  . glucose blood (ACCU-CHEK AVIVA PLUS) test strip Use to test blood sugar 6 times daily as instructed. Dx code: O24.912  200 each 3  . ibuprofen (ADVIL,MOTRIN) 600 MG tablet Take 1 tablet (600 mg total) by mouth every 6 (six) hours. 90 tablet 3  . insulin NPH Human (HUMULIN N,NOVOLIN N) 100 UNIT/ML injection Inject 0.04 mLs (4 Units total) into the skin 2 (two) times daily at 8 am and 10 pm. 10 mL 11  . Insulin Pen Needle (CAREFINE PEN NEEDLES) 32G X 4 MM MISC Use 5x a day 200 each 3  . Insulin Syringe-Needle U-100 (INSULIN SYRINGE .5CC/31GX5/16") 31G X 5/16" 0.5 ML MISC 1 Syringe by Does not apply route 4 (four) times daily as needed. 100 each 3  . metFORMIN (GLUCOPHAGE-XR) 500 MG 24 hr tablet Take 1 tablet (500 mg total) by mouth 3 (three) times daily with meals. 90 tablet 1  . ONETOUCH DELICA LANCETS FINE MISC Use to test blood sugar 6 times daily as instructed. 200 each 3  . Prenatal Vit-Fe Fumarate-FA (PRENATAL VITAMINS PLUS) 27-1 MG TABS Take 1 tablet by mouth daily. 30 tablet 12  . folic acid (FOLVITE) 1 MG tablet Take 1 tablet (1 mg total) by mouth daily. (Patient not taking: Reported on 08/29/2014) 100 tablet 12   No current facility-administered medications on file prior to visit.   Allergies  Allergen Reactions  . Penicillins Rash    childhood     Family History  Problem Relation Age of Onset  . Diabetes Father   . Kidney disease Father   . Diabetes Sister   . Diabetes Brother    PE: BP 110/62 mmHg  Pulse 82  Temp(Src) 98 F (36.7 C) (Oral)  Resp 12  Wt 137 lb (62.143 kg)  SpO2 98%  Breastfeeding? Yes Wt Readings from Last 3 Encounters:  08/29/14 137 lb (62.143 kg)  08/07/14 153 lb (69.4 kg)  08/03/14 152 lb (68.947 kg)   Constitutional: normal weight, in NAD Eyes: PERRLA, EOMI, no exophthalmos ENT: moist mucous membranes, no thyromegaly, no cervical lymphadenopathy Cardiovascular: RRR, No MRG Respiratory: CTA B Gastrointestinal: abdomen soft, NT, ND, BS+ Musculoskeletal: no deformities, strength intact in all 4 Skin: moist, warm, no rashes Neurological: no tremor with  outstretched hands, DTR normal in all 4  ASSESSMENT: 1. DM2, insulin-dependent, uncontrolled, without complications  PLAN:  1. Patient with several years h/o diabetes, now postpartum - health girl born 0/01/2014. NovoLog was stopped in the hospital >> now on NPH 4 units bid and Metformin 500 mg tid >> will switch to Lantus and add Glipizide XL as sugars are higher. I would like to test her for DM1, but sugars (fingerstick) now 190. Will check at next visit. - I suggested to:  Patient Instructions  Please stop NPH and start Lantus 7 units at bedtime. Continue Metformin XR 500 mg 3x a day with meals. Start Glipizide XL 5 mg in am.  Please return in 1.5 months with your sugar log.    -  continue checking sugars at different times of the day - check 2-3 times a day, rotating checks - will check a HbA1c >> 6.5% - refilled meds, strips, lancets - Return to clinic in 1.5 mo with sugar log.

## 2014-08-29 NOTE — Patient Instructions (Signed)
Please stop NPH and start Lantus 7 units at bedtime. Continue Metformin XR 500 mg 3x a day with meals. Start Glipizide XL 5 mg in am.  Please return in 1.5 months with your sugar log.

## 2014-09-01 ENCOUNTER — Encounter: Payer: Self-pay | Admitting: Internal Medicine

## 2014-09-06 ENCOUNTER — Ambulatory Visit (INDEPENDENT_AMBULATORY_CARE_PROVIDER_SITE_OTHER): Payer: Medicaid Other | Admitting: Obstetrics and Gynecology

## 2014-09-06 ENCOUNTER — Encounter: Payer: Self-pay | Admitting: Obstetrics and Gynecology

## 2014-09-06 VITALS — BP 96/73 | HR 72 | Temp 98.2°F | Wt 135.9 lb

## 2014-09-06 DIAGNOSIS — Z3202 Encounter for pregnancy test, result negative: Secondary | ICD-10-CM

## 2014-09-06 DIAGNOSIS — Z30018 Encounter for initial prescription of other contraceptives: Secondary | ICD-10-CM | POA: Diagnosis not present

## 2014-09-06 DIAGNOSIS — Z30017 Encounter for initial prescription of implantable subdermal contraceptive: Secondary | ICD-10-CM

## 2014-09-06 DIAGNOSIS — Z3049 Encounter for surveillance of other contraceptives: Secondary | ICD-10-CM | POA: Diagnosis not present

## 2014-09-06 LAB — POCT PREGNANCY, URINE: Preg Test, Ur: NEGATIVE

## 2014-09-06 MED ORDER — ETONOGESTREL 68 MG ~~LOC~~ IMPL
68.0000 mg | DRUG_IMPLANT | Freq: Once | SUBCUTANEOUS | Status: AC
  Administered 2014-09-06: 68 mg via SUBCUTANEOUS

## 2014-09-06 NOTE — Addendum Note (Signed)
Addended by: Gerome Apley on: 09/06/2014 04:25 PM   Modules accepted: Orders

## 2014-09-06 NOTE — Progress Notes (Signed)
Patient ID: Theresa Koch, female   DOB: 1980/12/12, 34 y.o.   MRN: [id number] Subjective:     Theresa Koch is a 34 y.o. female who presents for a postpartum visit. She is 4 weeks postpartum following a spontaneous vaginal delivery. I have fully reviewed the prenatal and intrapartum course. The delivery was at 39 gestational weeks. Outcome: spontaneous vaginal delivery. Anesthesia: epidural. Postpartum course has been uncomplicated. Baby's course has been uncomplicated. Baby is feeding by breast. Bleeding no bleeding. Bowel function is normal. Bladder function is normal. Patient is not sexually active. Contraception method is none. Postpartum depression screening: negative. Patient complaining of right leg numbness which has been present since delivery. She states that it has gradually improved but is still present and limited to inner thigh region. She denies any problems with ambulation and denies frequent falls.     Review of Systems A comprehensive review of systems was negative.   Objective:    There were no vitals taken for this visit.  General:  alert, cooperative and no distress   Breasts:  inspection negative, no nipple discharge or bleeding, no masses or nodularity palpable  Lungs: clear to auscultation bilaterally  Heart:  regular rate and rhythm  Abdomen: soft, non-tender; bowel sounds normal; no masses,  no organomegaly   Vulva:  normal  Vagina: normal vagina, no discharge, exudate, lesion, or erythema and perineal laceration slowly healing without signs of infection. Suture material still visible.   Cervix:  multiparous appearance and no lesions  Corpus: normal size, contour, position, consistency, mobility, non-tender  Adnexa:  normal adnexa and no mass, fullness, tenderness  Rectal Exam: Not performed.        Assessment:     Normal postpartum exam. Pap smear not done at today's visit.   Plan:    1. Contraception: Nexplanon  Patient given informed consent, signed copy in the  chart, time out was performed. Pregnancy test was negative. Appropriate time out taken.  Patient's left arm was prepped and draped in the usual sterile fashion.. The ruler used to measure and mark insertion area.  Patientt was prepped with alcohol swab and then injected with 1 cc of 1% lidocaine with epinephrine.  Patientt was prepped with betadine, Nexplanon removed form packaging. Device confirmed in needle, then inserted full length of needle and withdrawn per handbook instructions.  Patient insertion site covered with a band aid and a pressure dressing.   Minimal blood loss.  Patient tolerated the procedure well.   2. Patient is medically cleared to resume all activities of daily living  3. Patient to monitor numbness. If no improvement after 4 weeks, may need PT referral.  3. Follow up in: 5 months for annual exam or as needed.

## 2014-09-06 NOTE — Patient Instructions (Signed)
Etonogestrel implant What is this medicine? ETONOGESTREL (et oh noe JES trel) is a contraceptive (birth control) device. It is used to prevent pregnancy. It can be used for up to 3 years. This medicine may be used for other purposes; ask your health care provider or pharmacist if you have questions. COMMON BRAND NAME(S): Implanon, Nexplanon What should I tell my health care provider before I take this medicine? They need to know if you have any of these conditions: -abnormal vaginal bleeding -blood vessel disease or blood clots -cancer of the breast, cervix, or liver -depression -diabetes -gallbladder disease -headaches -heart disease or recent heart attack -high blood pressure -high cholesterol -kidney disease -liver disease -renal disease -seizures -tobacco smoker -an unusual or allergic reaction to etonogestrel, other hormones, anesthetics or antiseptics, medicines, foods, dyes, or preservatives -pregnant or trying to get pregnant -breast-feeding How should I use this medicine? This device is inserted just under the skin on the inner side of your upper arm by a health care professional. Talk to your pediatrician regarding the use of this medicine in children. Special care may be needed. Overdosage: If you think you've taken too much of this medicine contact a poison control center or emergency room at once. Overdosage: If you think you have taken too much of this medicine contact a poison control center or emergency room at once. NOTE: This medicine is only for you. Do not share this medicine with others. What if I miss a dose? This does not apply. What may interact with this medicine? Do not take this medicine with any of the following medications: -amprenavir -bosentan -fosamprenavir This medicine may also interact with the following medications: -barbiturate medicines for inducing sleep or treating seizures -certain medicines for fungal infections like ketoconazole and  itraconazole -griseofulvin -medicines to treat seizures like carbamazepine, felbamate, oxcarbazepine, phenytoin, topiramate -modafinil -phenylbutazone -rifampin -some medicines to treat HIV infection like atazanavir, indinavir, lopinavir, nelfinavir, tipranavir, ritonavir -St. John's wort This list may not describe all possible interactions. Give your health care provider a list of all the medicines, herbs, non-prescription drugs, or dietary supplements you use. Also tell them if you smoke, drink alcohol, or use illegal drugs. Some items may interact with your medicine. What should I watch for while using this medicine? This product does not protect you against HIV infection (AIDS) or other sexually transmitted diseases. You should be able to feel the implant by pressing your fingertips over the skin where it was inserted. Tell your doctor if you cannot feel the implant. What side effects may I notice from receiving this medicine? Side effects that you should report to your doctor or health care professional as soon as possible: -allergic reactions like skin rash, itching or hives, swelling of the face, lips, or tongue -breast lumps -changes in vision -confusion, trouble speaking or understanding -dark urine -depressed mood -general ill feeling or flu-like symptoms -light-colored stools -loss of appetite, nausea -right upper belly pain -severe headaches -severe pain, swelling, or tenderness in the abdomen -shortness of breath, chest pain, swelling in a leg -signs of pregnancy -sudden numbness or weakness of the face, arm or leg -trouble walking, dizziness, loss of balance or coordination -unusual vaginal bleeding, discharge -unusually weak or tired -yellowing of the eyes or skin Side effects that usually do not require medical attention (Report these to your doctor or health care professional if they continue or are bothersome.): -acne -breast pain -changes in  weight -cough -fever or chills -headache -irregular menstrual bleeding -itching, burning, and   vaginal discharge -pain or difficulty passing urine -sore throat This list may not describe all possible side effects. Call your doctor for medical advice about side effects. You may report side effects to FDA at 1-800-FDA-1088. Where should I keep my medicine? This drug is given in a hospital or clinic and will not be stored at home. NOTE: This sheet is a summary. It may not cover all possible information. If you have questions about this medicine, talk to your doctor, pharmacist, or health care provider.  2015, [employer]. (2011-06-30 15:37:45)  

## 2014-09-12 ENCOUNTER — Other Ambulatory Visit: Payer: Self-pay | Admitting: Internal Medicine

## 2014-09-12 ENCOUNTER — Encounter: Payer: Self-pay | Admitting: Internal Medicine

## 2014-09-12 DIAGNOSIS — E1165 Type 2 diabetes mellitus with hyperglycemia: Secondary | ICD-10-CM

## 2014-09-12 MED ORDER — INSULIN GLARGINE 100 UNIT/ML SOLOSTAR PEN: 5.0000 [IU] | PEN_INJECTOR | Freq: Every day | SUBCUTANEOUS | Status: DC

## 2014-09-12 MED ORDER — GLIPIZIDE ER 2.5 MG PO TB24: 5.0000 mg | ORAL_TABLET | Freq: Every day | ORAL | Status: DC

## 2014-10-04 NOTE — Telephone Encounter (Signed)
Patient a refill need refill of metformin 500 mg, send to  [id number] [place], [place] - [address] [phone] (Phone) [phone] (Fax)

## 2014-10-10 ENCOUNTER — Ambulatory Visit: Payer: Medicaid Other | Admitting: Internal Medicine

## 2014-10-26 ENCOUNTER — Ambulatory Visit (INDEPENDENT_AMBULATORY_CARE_PROVIDER_SITE_OTHER): Payer: Medicaid Other

## 2014-10-26 DIAGNOSIS — R350 Frequency of micturition: Secondary | ICD-10-CM | POA: Diagnosis not present

## 2014-10-26 LAB — POCT URINALYSIS DIP (DEVICE)
Bilirubin Urine: NEGATIVE
Glucose, UA: NEGATIVE mg/dL
Hgb urine dipstick: NEGATIVE
Ketones, ur: NEGATIVE mg/dL
Leukocytes, UA: NEGATIVE
NITRITE: NEGATIVE
PH: 5 (ref 5.0–8.0)
Protein, ur: NEGATIVE mg/dL
Specific Gravity, Urine: 1.025 (ref 1.005–1.030)
Urobilinogen, UA: 0.2 mg/dL (ref 0.0–1.0)

## 2014-10-26 NOTE — Progress Notes (Signed)
Pt came in office c/o urinary frequency and unable to hold her urine at times.  UA WDL, sent Urine Cx for clarification.  Spoke to pt about urinary incontinence and Kegel exercises, which information was given.  Pt also mentioned that since delivery she has been experiencing pain and bleeding with intercourse as well as perineal pain while standing for long periods of time.  Pt has appt on Monday 10-30-14 with provider to follow-up.

## 2014-10-27 LAB — URINE CULTURE

## 2014-10-30 ENCOUNTER — Ambulatory Visit (INDEPENDENT_AMBULATORY_CARE_PROVIDER_SITE_OTHER): Payer: Medicaid Other | Admitting: Medical

## 2014-10-30 ENCOUNTER — Encounter: Payer: Self-pay | Admitting: Medical

## 2014-10-30 VITALS — BP 110/71 | HR 69 | Wt 130.1 lb

## 2014-10-30 DIAGNOSIS — N921 Excessive and frequent menstruation with irregular cycle: Secondary | ICD-10-CM | POA: Diagnosis not present

## 2014-10-30 DIAGNOSIS — N393 Stress incontinence (female) (male): Secondary | ICD-10-CM | POA: Diagnosis not present

## 2014-10-30 DIAGNOSIS — R32 Unspecified urinary incontinence: Secondary | ICD-10-CM

## 2014-10-30 DIAGNOSIS — Z98891 History of uterine scar from previous surgery: Secondary | ICD-10-CM

## 2014-10-30 DIAGNOSIS — R350 Frequency of micturition: Secondary | ICD-10-CM

## 2014-10-30 DIAGNOSIS — Z975 Presence of (intrauterine) contraceptive device: Secondary | ICD-10-CM | POA: Diagnosis not present

## 2014-10-30 DIAGNOSIS — N941 Unspecified dyspareunia: Secondary | ICD-10-CM | POA: Diagnosis not present

## 2014-10-30 HISTORY — DX: History of uterine scar from previous surgery: Z98.891

## 2014-10-30 MED ORDER — LIDOCAINE 0.5 % EX GEL: 1.0000 "application " | CUTANEOUS | Status: DC | PRN

## 2014-10-30 NOTE — Patient Instructions (Addendum)
Vaginal Laceration A vaginal laceration is a tear in the vaginal wall. A vaginal tear falls into one of three categories:   Obstetrically related tears that occur at the time of childbirth.  Trauma-related tears (most often related to sexual intercourse).  Spontaneous tears. Vaginal tears can cause heavy bleeding (hemorrhaging) depending on severity of the tear. Tears can be intensely tender and interfere with normal activities of living. They can make sexual intercourse painful and bring on significant burning with urination. If you have a vaginal laceration, the area around your vagina may be painful when you touch or wipe it. Even light pressure from clothing may cause some pain. Vaginal tear need to be evaluated by your caregiver.  CAUSES   Obstetric-related causes, such as childbirth.  Trauma that may result from an accident during an activity, such as sexual intercourse or a bicycle ride.  Spontaneous causes related to aging, failed healing of a past obstetric tear, chronic irritation, or skin changes that are not well understood. SYMPTOMS   Slight to heavy vaginal bleeding.  Vaginal swelling.  Mild to severe pain.  Vaginal tenderness. DIAGNOSIS  If the tear happened during childbirth, your caregiver can diagnose the tear at that time. To diagnose a vaginal tear that happened spontaneously or because of trauma, your caregiver will perform a physical exam. During the physical exam, your caregiver may also look for any signs of trouble that may need further testing. If there is hemorrhaging, your caregiver may suggest blood tests to determine the extent of bleeding. Imaging tests may be performed, such as an ultrasonography or computed tomography (CT), to look for internal damage. A biopsy may be need if there are signs of a more serious problem.  TREATMENT  Treatment depends on the severity of the tear. For minor tears that heal on their own, treatment may only consist of keeping  the area clean and dry. Some tears need to be repaired with stitches. Other tears may heal on their own with help from various remedies, such as antibiotic ointments, medicated creams, or petroleum products. Depending on the circumstances, oral hormones may also be suggested. Hormone remedies may also be in the form of topical creams and vaginal tablets. For more concerning situations, hospitalization and surgical repair of the tear may be needed. HOME CARE INSTRUCTIONS   Take warm-water baths that cover your hips and buttocks (sitz bath) 2 to 3 times a day. This may help any discomfort and swelling.   Only take over-the-counter or prescription medicines for pain, discomfort, or fever as directed by your caregiver. Do not use aspirin because it can cause increased bleeding.   Do not douche, use tampons, or have intercourse until your caregiver says it is okay.   A bandage (dressing) may have been applied. Change the dressing once a day or as directed. If the dressing sticks, soak it off with warm, soapy water.   Apply ice or witch hazel pads to the vagina to lessen any pain or discomfort.   Take a stool softener or follow a special diet as directed by your caregiver. This will help ease discomfort associated with bowel movements.  SEEK IMMEDIATE MEDICAL CARE IF:   You have redness or swelling in the vaginal area.   You have increasing, sharp, or intense pain or tenderness in the vaginal area.  You have pus or unusual discharge coming from the tear or vagina.   You notice a bad smell coming from the vagina.   Your tear breaks open after  it healed or was repaired.   You feel lightheaded.  You have increasing abdominal pain.   You have an increasing or heavy amount of vaginal bleeding.   You have pain with intercourse after the tear heals.  MAKE SURE YOU:  Understand these instructions.  Will watch your condition.  Will get help right away if you are not doing well  or get worse.   This information is not intended to replace advice given to you by your health care provider. Make sure you discuss any questions you have with your health care provider.   Document Released: 12/23/2004 Document Revised: 09/17/2011 Document Reviewed: 05/12/2011 Elsevier Interactive Patient Education 2016 [employer] Urinary Incontinence Urinary incontinence is the involuntary loss of urine from your bladder. CAUSES  There are many causes of urinary incontinence. They include:  Medicines.  Infections.  Prostatic enlargement, leading to overflow of urine from your bladder.  Surgery.  Neurological diseases.  Emotional factors. SIGNS AND SYMPTOMS Urinary Incontinence can be divided into four types:  Urge incontinence. Urge incontinence is the involuntary loss of urine before you have the opportunity to go to the bathroom. There is a sudden urge to void but not enough time to reach a bathroom.  Stress incontinence. Stress incontinence is the sudden loss of urine with any activity that forces urine to pass. It is commonly caused by anatomical changes to the pelvis and sphincter areas of your body.  Overflow incontinence. Overflow incontinence is the loss of urine from an obstructed opening to your bladder. This results in a backup of urine and a resultant buildup of pressure within the bladder. When the pressure within the bladder exceeds the closing pressure of the sphincter, the urine overflows, which causes incontinence, similar to water overflowing a dam.  Total incontinence. Total incontinence is the loss of urine as a result of the inability to store urine within your bladder. DIAGNOSIS  Evaluating the cause of incontinence may require:  A thorough and complete medical and obstetric history.  A complete physical exam.  Laboratory tests such as a urine culture and sensitivities. When additional tests are indicated, they can include:  An ultrasound  exam.  Kidney and bladder X-rays.  Cystoscopy. This is an exam of the bladder using a narrow scope.  Urodynamic testing to test the nerve function to the bladder and sphincter areas. TREATMENT  Treatment for urinary incontinence depends on the cause:  For urge incontinence caused by a bacterial infection, antibiotics will be prescribed. If the urge incontinence is related to medicines you take, your health care provider may have you change the medicine.  For stress incontinence, surgery to re-establish anatomical support to the bladder or sphincter, or both, will often correct the condition.  For overflow incontinence caused by an enlarged prostate, an operation to open the channel through the enlarged prostate will allow the flow of urine out of the bladder. In women with fibroids, a hysterectomy may be recommended.  For total incontinence, surgery on your urinary sphincter may help. An artificial urinary sphincter (an inflatable cuff placed around the urethra) may be required. In women who have developed a hole-like passage between their bladder and vagina (vesicovaginal fistula), surgery to close the fistula often is required. HOME CARE INSTRUCTIONS  Normal daily hygiene and the use of pads or adult diapers that are changed regularly will help prevent odors and skin damage.  Avoid caffeine. It can overstimulate your bladder.  Use the bathroom regularly. Try about every 2-3 hours to go  to the bathroom, even if you do not feel the need to do so. Take time to empty your bladder completely. After urinating, wait a minute. Then try to urinate again.  For causes involving nerve dysfunction, keep a log of the medicines you take and a journal of the times you go to the bathroom. SEEK MEDICAL CARE IF:  You experience worsening of pain instead of improvement in pain after your procedure.  Your incontinence becomes worse instead of better. SEE IMMEDIATE MEDICAL CARE IF:  You experience fever  or shaking chills.  You are unable to pass your urine.  You have redness spreading into your groin or down into your thighs. MAKE SURE YOU:   Understand these instructions.   Will watch your condition.  Will get help right away if you are not doing well or get worse.   This information is not intended to replace advice given to you by your health care provider. Make sure you discuss any questions you have with your health care provider.   Document Released: 01/31/2004 Document Revised: 01/13/2014 Document Reviewed: 06/01/2012 Elsevier Interactive Patient Education [employer] Contraception Choices Birth control (contraception) is the use of any methods or devices to stop pregnancy from happening. Below are some methods to help avoid pregnancy. HORMONAL BIRTH CONTROL  A small tube put under the skin of the upper arm (implant). The tube can stay in place for 3 years. The implant must be taken out after 3 years.  Shots given every 3 months.  Pills taken every day.  Patches that are changed once a week.  A ring put into the vagina (vaginal ring). The ring is left in place for 3 weeks and removed for 1 week. Then, a new ring is put in the vagina.  Emergency birth control pills taken after unprotected sex (intercourse). BARRIER BIRTH CONTROL   A thin covering worn on the penis (female condom) during sex.  A soft, loose covering put into the vagina (female condom) before sex.  A rubber bowl that sits over the cervix (diaphragm). The bowl must be made for you. The bowl is put into the vagina before sex. The bowl is left in place for 6 to 8 hours after sex.  A small, soft cup that fits over the cervix (cervical cap). The cup must be made for you. The cup can be left in place for 48 hours after sex.  A sponge that is put into the vagina before sex.  A chemical that kills or stops sperm from getting into the cervix and uterus (spermicide). The chemical may be a cream, jelly,  foam, or pill. INTRAUTERINE (IUD) BIRTH CONTROL   IUD birth control is a small, T-shaped piece of plastic. The plastic is put inside the uterus. There are 2 types of IUD:  Copper IUD. The IUD is covered in copper wire. The copper makes a fluid that kills sperm. It can stay in place for 10 years.  Hormone IUD. The hormone stops pregnancy from happening. It can stay in place for 5 years. PERMANENT METHODS  When the woman has her fallopian tubes sealed, tied, or blocked during surgery. This stops the egg from traveling to the uterus.  The doctor places a small coil or insert into each fallopian tube. This causes scar tissue to form and blocks the fallopian tubes.  When the female has the tubes that carry sperm tied off (vasectomy). NATURAL FAMILY PLANNING BIRTH CONTROL   Natural family planning means not having sex  or using barrier birth control on the days the woman could become pregnant.  Use a calendar to keep track of the length of each period and know the days she can get pregnant.  Avoid sex during ovulation.  Use a thermometer to measure body temperature. Also watch for symptoms of ovulation.  Time sex to be after the woman has ovulated. Use condoms to help protect yourself against sexually transmitted infections (STIs). Do this no matter what type of birth control you use. Talk to your doctor about which type of birth control is best for you.   This information is not intended to replace advice given to you by your health care provider. Make sure you discuss any questions you have with your health care provider.   Document Released: 10/20/2008 Document Revised: 12/28/2012 Document Reviewed: 07/14/2012 Elsevier Interactive Patient Education [employer]

## 2014-10-30 NOTE — Progress Notes (Signed)
Patient ID: Theresa PerchesHoyam Waidelich, female   DOB: 1980-05-12, 34 y.o.   MRN: [id number]  History:  Ms. Theresa Koch is a 34 y.o. [id number] who presents to clinic today for urinary urgency, incontinence and dyspareunia. The patient states that she has had issues with inability to hold her bladder for long when it is full. This has been an issue since the delivery of her last child in August. She states that this happens almost every time she has a full bladder. She was seen in [facility] on 10/26/14 and had a negative UA and subsequent negative urine culture as well. She also states that she had a vaginal laceration during the delivery and had previously had a female circumcision. She states that there is pain at the area of the laceration and that it is causing pain with intercourse. The patient also states irregular periods. She has Nexplanon in place for birth control and understands that this is a common side effect. She is also worried that the Nexplanon is making her feel weak, dizzy and have depressed at times. The patient does also have DM which she states is well-[facility] and [facility].   The following portions of the patient's history were reviewed and updated as appropriate: allergies, current medications, family history, past medical history, social history, past surgical history and problem list.  Review of Systems:  Other than those mentioned in HPI all ROS negative  Objective:  Physical Exam BP 110/71 mmHg  Pulse 69  Wt 130 lb 1.6 oz (59.013 kg) CONSTITUTIONAL: Well-developed, well-nourished female in no acute distress.  EYES: EOM intact, conjunctivae normal, no scleral icterus HEAD: Normocephalic, atraumatic ENT: External right and left ear normal, oropharynx is clear and moist. CARDIOVASCULAR: Normal heart rate noted, regular rhythm. No cyanosis or edema.  RESPIRATORY: Clear to auscultation bilaterally. Effort and breath sounds normal, no problems with respiration noted. GASTROINTESTINAL:Soft,  normal bowel sounds, no distention noted.  No tenderness, rebound or guarding.  GENITOURINARY: External genitalia with small area of scar tissue on the right side proximal to the perineum noted. Otherwise laceration is well-healed. Grade 1 Cystocele noted. Normal appearing vaginal mucosa and cervix.  Normal appearing discharge.  Uterus normal size without masses, no uterine or adnexal tenderness.  MUSCULOSKELETAL: Normal range of motion.  SKIN: Skin is warm and dry. No rash noted. Not diaphoretic. No erythema. No pallor. NEUROLGIC: Alert and oriented to person, place, and time. Normal muscle tone, coordination.  PSYCHIATRIC: Normal mood and affect. Normal behavior. Normal judgment and thought content. HEM/LYMPH/IMMUNOLOGIC: Neck supple.   Labs and Imaging CBC today  MDM Dr. Jolayne Pantheronstant present for exam to assess vaginal laceration. She feels that this is scar tissue and that the original laceration has healed.  Discussed with Dr. Jolayne Pantheronstant and Dr. Debroah LoopArnold, who suggests topical lidocaine gel for symptomatic management while the area continues to heal and soften  Assessment & Plan:  Assessment: Urinary incontinence  Urinary urgency H/O vaginal laceration from childbirth with scar tissue formation  Plans: CBC today. Patient will be contacted with abnormal results. May require iron supplement for anemia due to irregular, frequent menstrual periods Referred to Urogynecology for further evaluation and management of incontinence and urgency Rx for Lidocaine gel sent to patient's pharmacy to be used PRN for pain of the perineum until further healing can occur [facility] needed or if symptoms were to change or worsen  Marny LowensteinJulie N Takiya Belmares, PA-C 10/30/2014 1:41 PM

## 2014-10-31 LAB — CBC
HCT: 36.8 % (ref 36.0–46.0)
Hemoglobin: 11.9 g/dL — ABNORMAL LOW (ref 12.0–15.0)
MCH: 26.1 pg (ref 26.0–34.0)
MCHC: 32.3 g/dL (ref 30.0–36.0)
MCV: 80.7 fL (ref 78.0–100.0)
Platelets: 144 10*3/uL — ABNORMAL LOW (ref 150–400)
RBC: 4.56 MIL/uL (ref 3.87–5.11)
RDW: 18 % — AB (ref 11.5–15.5)
WBC: 6.2 10*3/uL (ref 4.0–10.5)

## 2014-11-09 ENCOUNTER — Encounter: Payer: Self-pay | Admitting: Medical

## 2014-11-13 ENCOUNTER — Telehealth: Payer: Self-pay | Admitting: General Practice

## 2014-11-13 ENCOUNTER — Encounter: Payer: Self-pay | Admitting: Obstetrics and Gynecology

## 2014-11-13 DIAGNOSIS — N941 Unspecified dyspareunia: Secondary | ICD-10-CM

## 2014-11-13 MED ORDER — LIDOCAINE 0.5 % EX GEL: 1.0000 "application " | CUTANEOUS | Status: DC | PRN

## 2014-11-13 NOTE — Telephone Encounter (Signed)
Telephone call to patient regarding mychart message. Patient states she was supposed to receive a prescription for a gel for her vaginal tear and it hasn't gone to her pharmacy yet. Patient also states that we talked to her about her incontinence and that she was unhappy with nexplanon because she felt like it was causing her to have dizziness, weakness and just generally not feeling well. Patient states we were supposed to take her blood to make sure her symptoms weren't related to her iron level. Informed patient of CBC and that if she would like an appt to have her nexplanon removed, we can definitely schedule that for her. Patient verbalized understanding and states yes, can I get different contraception that day. Told patient yes as long as she has insurance. Patient states she is waiting on her insurance card. Told patient I can resend the gel prescription to her pharmacy since they have not received it. Patient verbalized understanding and is aware someone will contact her with an appt. Patient had no other questions

## 2014-11-15 ENCOUNTER — Ambulatory Visit: Payer: Medicaid Other | Admitting: Obstetrics & Gynecology

## 2014-11-24 ENCOUNTER — Ambulatory Visit: Payer: Medicaid Other | Admitting: Internal Medicine

## 2014-12-01 ENCOUNTER — Emergency Department (HOSPITAL_COMMUNITY)
Admission: EM | Admit: 2014-12-01 | Discharge: 2014-12-01 | Disposition: A | Discharge status: Home / Self Care | Payer: Self-pay | Attending: Emergency Medicine | Admitting: Emergency Medicine

## 2014-12-01 ENCOUNTER — Emergency Department (HOSPITAL_COMMUNITY): Payer: Medicaid Other

## 2014-12-01 ENCOUNTER — Encounter (HOSPITAL_COMMUNITY): Payer: Self-pay | Admitting: *Deleted

## 2014-12-01 ENCOUNTER — Inpatient Hospital Stay (HOSPITAL_COMMUNITY)
Admission: AD | Admit: 2014-12-01 | Discharge: 2014-12-01 | Disposition: A | Discharge status: Home / Self Care | Payer: Self-pay | Source: Ambulatory Visit | Attending: Family Medicine | Admitting: Family Medicine

## 2014-12-01 DIAGNOSIS — S161XXA Strain of muscle, fascia and tendon at neck level, initial encounter: Secondary | ICD-10-CM | POA: Insufficient documentation

## 2014-12-01 DIAGNOSIS — E119 Type 2 diabetes mellitus without complications: Secondary | ICD-10-CM | POA: Insufficient documentation

## 2014-12-01 DIAGNOSIS — F419 Anxiety disorder, unspecified: Secondary | ICD-10-CM | POA: Insufficient documentation

## 2014-12-01 DIAGNOSIS — M545 Low back pain: Secondary | ICD-10-CM | POA: Insufficient documentation

## 2014-12-01 DIAGNOSIS — M542 Cervicalgia: Secondary | ICD-10-CM

## 2014-12-01 DIAGNOSIS — M79602 Pain in left arm: Secondary | ICD-10-CM | POA: Insufficient documentation

## 2014-12-01 DIAGNOSIS — M25512 Pain in left shoulder: Secondary | ICD-10-CM | POA: Insufficient documentation

## 2014-12-01 DIAGNOSIS — Z880 Allergy status to penicillin: Secondary | ICD-10-CM | POA: Insufficient documentation

## 2014-12-01 DIAGNOSIS — Z794 Long term (current) use of insulin: Secondary | ICD-10-CM | POA: Insufficient documentation

## 2014-12-01 DIAGNOSIS — Z79899 Other long term (current) drug therapy: Secondary | ICD-10-CM | POA: Insufficient documentation

## 2014-12-01 LAB — BASIC METABOLIC PANEL
Anion gap: 8 (ref 5–15)
BUN: 16 mg/dL (ref 6–20)
CHLORIDE: 104 mmol/L (ref 101–111)
CO2: 24 mmol/L (ref 22–32)
CREATININE: 0.48 mg/dL (ref 0.44–1.00)
Calcium: 9.1 mg/dL (ref 8.9–10.3)
Glucose, Bld: 84 mg/dL (ref 65–99)
Potassium: 3.8 mmol/L (ref 3.5–5.1)
SODIUM: 136 mmol/L (ref 135–145)

## 2014-12-01 LAB — CBC WITH DIFFERENTIAL/PLATELET
Basophils Absolute: 0 10*3/uL (ref 0.0–0.1)
Basophils Relative: 0 %
Eosinophils Absolute: 0.1 10*3/uL (ref 0.0–0.7)
Eosinophils Relative: 1 %
HCT: 36.3 % (ref 36.0–46.0)
Hemoglobin: 11.9 g/dL — ABNORMAL LOW (ref 12.0–15.0)
Lymphocytes Relative: 35 %
Lymphs Abs: 2.1 10*3/uL (ref 0.7–4.0)
MCH: 27.5 pg (ref 26.0–34.0)
MCHC: 32.8 g/dL (ref 30.0–36.0)
MCV: 84 fL (ref 78.0–100.0)
Monocytes Absolute: 0.2 10*3/uL (ref 0.1–1.0)
Monocytes Relative: 4 %
Neutro Abs: 3.5 10*3/uL (ref 1.7–7.7)
Neutrophils Relative %: 59 %
Platelets: 162 10*3/uL (ref 150–400)
RBC: 4.32 MIL/uL (ref 3.87–5.11)
RDW: 15.9 % — ABNORMAL HIGH (ref 11.5–15.5)
WBC: 5.9 10*3/uL (ref 4.0–10.5)

## 2014-12-01 LAB — I-STAT CHEM 8, ED
BUN: 18 mg/dL (ref 6–20)
CALCIUM ION: 1.2 mmol/L (ref 1.12–1.23)
Chloride: 101 mmol/L (ref 101–111)
Creatinine, Ser: 0.6 mg/dL (ref 0.44–1.00)
Glucose, Bld: 94 mg/dL (ref 65–99)
HCT: 39 % (ref 36.0–46.0)
Hemoglobin: 13.3 g/dL (ref 12.0–15.0)
Potassium: 3.9 mmol/L (ref 3.5–5.1)
Sodium: 139 mmol/L (ref 135–145)
TCO2: 25 mmol/L (ref 0–100)

## 2014-12-01 LAB — I-STAT TROPONIN, ED: TROPONIN I, POC: 0 ng/mL (ref 0.00–0.08)

## 2014-12-01 MED ORDER — CYCLOBENZAPRINE HCL 10 MG PO TABS
10.0000 mg | ORAL_TABLET | Freq: Once | ORAL | Status: AC
  Administered 2014-12-01: 10 mg via ORAL
  Filled 2014-12-01: qty 1

## 2014-12-01 MED ORDER — SODIUM CHLORIDE 0.9 % IV BOLUS (SEPSIS)
500.0000 mL | Freq: Once | INTRAVENOUS | Status: AC
  Administered 2014-12-01: 500 mL via INTRAVENOUS

## 2014-12-01 MED ORDER — CYCLOBENZAPRINE HCL 10 MG PO TABS: 10.0000 mg | ORAL_TABLET | Freq: Two times a day (BID) | ORAL | Status: DC | PRN

## 2014-12-01 MED ORDER — IBUPROFEN 600 MG PO TABS: 600.0000 mg | ORAL_TABLET | Freq: Four times a day (QID) | ORAL | Status: DC

## 2014-12-01 MED ORDER — METHOCARBAMOL 1000 MG/10ML IJ SOLN
1000.0000 mg | Freq: Once | INTRAMUSCULAR | Status: DC
  Administered 2014-12-01: 1000 mg via INTRAVENOUS
  Filled 2014-12-01: qty 10

## 2014-12-01 MED ORDER — KETOROLAC TROMETHAMINE 60 MG/2ML IM SOLN
60.0000 mg | Freq: Once | INTRAMUSCULAR | Status: AC
  Administered 2014-12-01: 60 mg via INTRAMUSCULAR
  Filled 2014-12-01: qty 2

## 2014-12-01 MED ORDER — METHOCARBAMOL 1000 MG/10ML IJ SOLN: 1000.0000 mg | Freq: Once | INTRAMUSCULAR | Status: DC

## 2014-12-01 MED ORDER — LORAZEPAM 2 MG/ML IJ SOLN: 0.5000 mg | Freq: Once | INTRAMUSCULAR | Status: DC

## 2014-12-01 MED ORDER — OXYCODONE-ACETAMINOPHEN 5-325 MG PO TABS
1.0000 | ORAL_TABLET | Freq: Once | ORAL | Status: AC
  Administered 2014-12-01: 1 via ORAL
  Filled 2014-12-01: qty 1

## 2014-12-01 MED ORDER — SODIUM CHLORIDE 0.9 % IV SOLN
INTRAVENOUS | Status: DC
  Administered 2014-12-01: 17:00:00 via INTRAVENOUS

## 2014-12-01 MED ORDER — KETOROLAC TROMETHAMINE 60 MG/2ML IM SOLN
60.0000 mg | Freq: Once | INTRAMUSCULAR | Status: DC
  Filled 2014-12-01: qty 2

## 2014-12-01 MED ORDER — ORPHENADRINE CITRATE 30 MG/ML IJ SOLN
60.0000 mg | Freq: Once | INTRAMUSCULAR | Status: DC
Start: 2014-12-01 — End: 2014-12-01

## 2014-12-01 MED ORDER — LORAZEPAM 1 MG PO TABS
1.0000 mg | ORAL_TABLET | Freq: Once | ORAL | Status: DC
  Filled 2014-12-01: qty 1

## 2014-12-01 MED ORDER — LORAZEPAM 2 MG/ML IJ SOLN
1.0000 mg | Freq: Once | INTRAMUSCULAR | Status: AC
Start: 2014-12-01 — End: 2014-12-01
  Administered 2014-12-01: 1 mg via INTRAVENOUS
  Filled 2014-12-01: qty 1

## 2014-12-01 MED ORDER — KETOROLAC TROMETHAMINE 60 MG/2ML IM SOLN
60.0000 mg | Freq: Once | INTRAMUSCULAR | Status: DC
Start: 2014-12-01 — End: 2014-12-01

## 2014-12-01 NOTE — Discharge Instructions (Signed)

## 2014-12-01 NOTE — ED Notes (Signed)
Crying out in pain no tears

## 2014-12-01 NOTE — ED Provider Notes (Signed)
Care assumed by Theresa Peliffany Greene, PA-C at [facility]. Please see her note for HPI, ROS, PE and initial workup.  Postpartum, breastfeeding. nexplanon placed end of august. No signs of infection. Here with left neck and arm pain and back pain. XR negative. Tender on exam, maybe some left neck adenopathy. Given fluids, ativan. Labs drawn. No SOB to suggest PE. Doubt MI, epidural abscess, epidural abscess.   Per my exam, patient has reproducible TTP right upper arm musculature and R trapezius. Will given IV robaxin. Patient refused CXR. Will d/c home with flexeril.  Discussed with Dr. Clayborne Koch.  Theresa BergerJonah Khalee Mazo, MD 12/02/14 [id number]  Theresa MemosJason Mesner, MD 12/02/14 [phone]

## 2014-12-01 NOTE — Progress Notes (Signed)
Julie Wenzel PA in earlier to discuss d/c plan. Written and verbal d/c instructions given and understanding voiced. 

## 2014-12-01 NOTE — ED Notes (Signed)
Pain is better 

## 2014-12-01 NOTE — ED Notes (Signed)
Provider at bedside spoke with patient regarding pain medication and verbalized understanding of risk and benefits of breast feeding.

## 2014-12-01 NOTE — ED Notes (Signed)
Pt is here with posterior neck and lateral neck pain with pain radiating down back and in left shoulder and arm.  Pt has pain with palpation and moving of extremity.  Pain for one week, denies injury.

## 2014-12-01 NOTE — MAU Provider Note (Signed)
History     CSN: [id number]  Arrival date and time: 12/01/14 2007   First Provider Initiated Contact with Patient 12/01/14 2106      Chief Complaint  Patient presents with  . Arm Pain   HPI  Ms. Akosua Constantine is a 34 y.o. [id number] who presents to MAU today with complaint of left shoulder and neck pain. The patient also endorses some low back pain primarily on the left side. She states that pain started yesterday. She went to [facility] earlier today for evaluation and had a negative cervical spine XRAY, negative left shoulder XRAY, negative troponin and normal CBC, BMP. She was given Rx for Flexeril. She did not fill Rx, but came almost immediately to MAU. She states that she feels the Nexplanon is the source of her pain, although the Nexplanon was placed in August and pain started yesterday. Her husband says that she feels this is the problem. She "doesn't trust it" and wants it removed. When recently seen in [facility] she told me that she felt the Nexplanon was making her dizzy and feel faint. She denies erythema, edema or pain over the area of the Nexplanon. She denies numbness, tingling or lack of strength. She endorses pain with movement. She is currently breastfeeding. She denies fever or recent injury.   OB History    Gravida Para Term Preterm AB TAB SAB Ectopic Multiple Living   0 2      Obstetric Comments   Previous cs for diabetes       Past Medical History  Diagnosis Date  . Diabetes mellitus without complication [facility])     Past Surgical History  Procedure Laterality Date  . Cesarean section      Family History  Problem Relation Age of Onset  . Diabetes Father   . Kidney disease Father   . Diabetes Sister   . Diabetes Brother     Social History  Substance Use Topics  . Smoking status: Never Smoker   . Smokeless tobacco: Never Used  . Alcohol Use: No    Allergies:  Allergies  Allergen Reactions  . Penicillins Rash    Has patient had a PCN reaction causing  immediate rash, facial/tongue/throat swelling, SOB or lightheadedness with hypotension:  Has patient had a PCN reaction causing severe rash involving mucus membranes or skin necrosis: No Has patient had a PCN reaction that required hospitalization  Has patient had a PCN reaction occurring within the last 10 years:  If all of the above answers are "NO", then may proceed with Cephalosporin use.    No prescriptions prior to admission    Review of Systems  Constitutional: Negative for fever, chills and malaise/fatigue.  Respiratory: Negative for shortness of breath.   Cardiovascular: Negative for chest pain.  Gastrointestinal: Negative for nausea, vomiting, abdominal pain, diarrhea and constipation.  Musculoskeletal: Positive for myalgias, back pain, joint pain (left shoulder) and neck pain.  Skin: Negative for rash.  Neurological: Negative for weakness.   Physical Exam   Blood pressure 117/77, pulse 69, temperature 97.3 F (36.3 C), resp. rate 16, height  (1.651 m), weight 132 lb 9.6 oz (60.147 kg), currently breastfeeding.  Physical Exam  Constitutional: She is oriented to person, place, and time. She appears well-developed and well-nourished. No distress.  HENT:  Head: Normocephalic and atraumatic.  Cardiovascular: Normal rate.   Respiratory: Effort normal. No respiratory distress.  GI: Soft. She exhibits no distension.  Musculoskeletal: Normal range  of motion. She exhibits no edema.       Left shoulder: She exhibits tenderness (moderate tenderness to palpation over the biciptial groove), pain and decreased strength. She exhibits normal range of motion, no bony tenderness, no swelling, no effusion, no crepitus, no deformity and no spasm.       Left elbow: She exhibits normal range of motion and no swelling. No tenderness found.       Cervical back: She exhibits no tenderness, no bony tenderness, no swelling, no edema, no pain and no spasm.       Thoracic back: She exhibits  normal range of motion, no bony tenderness, no swelling, no edema, no pain and no spasm.       Lumbar back: She exhibits normal range of motion, no tenderness, no swelling, no edema, no pain and no spasm.  Neurological: She is alert and oriented to person, place, and time. She has normal strength. No cranial nerve deficit.  Skin: Skin is warm and dry. No rash noted. No erythema.  Psychiatric: She has a normal mood and affect.   Results for orders placed or performed during the hospital encounter of 12/01/14 (from the past 24 hour(s))  I-stat chem 8, ed     Status: None   Collection Time: 12/01/14  2:56 PM  Result Value Ref Range   Sodium 139 135 - 145 mmol/L   Potassium 3.9 3.5 - 5.1 mmol/L   Chloride 101 101 - 111 mmol/L   BUN 18 6 - 20 mg/dL   Creatinine, Ser 0.98 0.44 - 1.00 mg/dL   Glucose, Bld 94 65 - 99 mg/dL   Calcium, Ion 1.19 1.12 - 1.23 mmol/L   TCO2 25 0 - 100 mmol/L   Hemoglobin 13.3 12.0 - 15.0 g/dL   HCT 14.7 82.9 - 56.2 %  CBC with Differential/Platelet     Status: Abnormal   Collection Time: 12/01/14  3:55 PM  Result Value Ref Range   WBC 5.9 4.0 - 10.5 K/uL   RBC 4.32 3.87 - 5.11 MIL/uL   Hemoglobin 11.9 (L) 12.0 - 15.0 g/dL   HCT 13.0 86.5 - 78.4 %   MCV 84.0 78.0 - 100.0 fL   MCH 27.5 26.0 - 34.0 pg   MCHC 32.8 30.0 - 36.0 g/dL   RDW 69.6 (H) 29.5 - 28.4 %   Platelets 162 150 - 400 K/uL   Neutrophils Relative % 59 %   Neutro Abs 3.5 1.7 - 7.7 K/uL   Lymphocytes Relative 35 %   Lymphs Abs 2.1 0.7 - 4.0 K/uL   Monocytes Relative 4 %   Monocytes Absolute 0.2 0.1 - 1.0 K/uL   Eosinophils Relative 1 %   Eosinophils Absolute 0.1 0.0 - 0.7 K/uL   Basophils Relative 0 %   Basophils Absolute 0.0 0.0 - 0.1 K/uL  Basic metabolic panel     Status: None   Collection Time: 12/01/14  3:55 PM  Result Value Ref Range   Sodium 136 135 - 145 mmol/L   Potassium 3.8 3.5 - 5.1 mmol/L   Chloride 104 101 - 111 mmol/L   CO2 24 22 - 32 mmol/L   Glucose, Bld 84 65 - 99 mg/dL    BUN 16 6 - 20 mg/dL   Creatinine, Ser 1.32 0.44 - 1.00 mg/dL   Calcium 9.1 8.9 - 44.0 mg/dL   GFR calc non Af Amer >60 >60 mL/min   GFR calc Af Amer >60 >60 mL/min   Anion gap 8 5 -  15  I-stat troponin, ED     Status: None   Collection Time: 12/01/14  4:02 PM  Result Value Ref Range   Troponin i, poc 0.00 0.00 - 0.08 ng/mL   Comment 3           Dg Cervical Spine Complete  12/01/2014  CLINICAL DATA:  Left lateral neck pain. EXAM: CERVICAL SPINE - COMPLETE 4+ VIEW COMPARISON:  None. FINDINGS: There is no evidence of cervical spine fracture or prevertebral soft tissue swelling. There is reversal of cervical lordosis in the lower cervical spine. The bony neural foramina are patent. No other significant bone abnormalities are identified. IMPRESSION: No evidence of fracture or subluxation. Electronically Signed   By: Ted Mcalpineobrinka  Dimitrova M.D.   On: 12/01/2014 15:12   Dg Shoulder Left  12/01/2014  CLINICAL DATA:  Woke up today with lateral left neck pain and left shoulder pain. Pain radiates from the left upper back into the left arm. EXAM: LEFT SHOULDER - 2+ VIEW COMPARISON:  None. FINDINGS: Negative for fracture or dislocation. Visualized left ribs are intact. Left AC joint appears to be intact. There is a thin tubular structure overlying the left upper arm on the axillary view but only seen on this image. This could be outside of the patient. IMPRESSION: No acute bone abnormality to the left shoulder. Electronically Signed   By: Richarda OverlieAdam  Henn M.D.   On: 12/01/2014 15:12   MAU Course  Procedures None  MDM ED provider notes, lab results and [facility] 60 mg IM toradol, 10 mg Flexeril given Ice applied to the left shoulder Full ROM, no redness or edema noted No SOB, chest pain Neck pain and back pain resolved with Toradol and Flexeril, still having mild pain of the left shoulder and outer upper arm  Assessment and Plan  A: Left shoulder pain, most  likely musculoskeletal in origin, possibly tendonitis  Neck strain  P: Discharge home Rx for Ibuprofen given to patient Patient advised to use ice to the area throughout the weekend Warning signs for worsening condition discussed. If symptoms worsen patient [facility] Patient advised to follow-up with [facility] on Monday at 2:00 pm for Nexplanon removal Contact information for PCP given for follow-up with [facility] if should pain does not resolve with NSAIDs, ice and change in activity Discussed supportive measures for breastfeeding to avoid strain to the neck, back and shoulders Patient may return to MAU as needed  Marny LowensteinJulie N Wenzel, PA-C  12/02/2014, 1:12 AM

## 2014-12-01 NOTE — ED Provider Notes (Signed)
CSN: [id number]     Arrival date & time 12/01/14  1144 History   First MD Initiated Contact with Patient 12/01/14 1326     Chief Complaint  Patient presents with  . Neck Pain  . Arm Pain  . Back Pain     (Consider location/radiation/quality/duration/timing/severity/associated sxs/prior Treatment) HPI  Patient recently gave birth in August of 2016, after delivery she reports having a Nexplanon placed. Since then she has been experiencing body aches. She went to [facility] to have it taken out but pt reports they refused. In the past week she has been experiencing left neck and shoulder pain that extends down into the elbow. She has also noticed a few lymph nodes that are swollen. She has not had any fevers, N/V/D or chills. She reports waking up with pain 1 week ago to the neck and shoulder (left) and it has been progressively getting worse to now she is unable to bear the pain and came to have her Nexplanon removed. She believes that this is the cause of her pain. She has not had any respiratory symptoms. No coughing, shortness of breath, respiratory distress.  Past Medical History  Diagnosis Date  . Diabetes mellitus without complication [facility])    Past Surgical History  Procedure Laterality Date  . Cesarean section     Family History  Problem Relation Age of Onset  . Diabetes Father   . Kidney disease Father   . Diabetes Sister   . Diabetes Brother    Social History  Substance Use Topics  . Smoking status: Never Smoker   . Smokeless tobacco: Never Used  . Alcohol Use: No   OB History    Gravida Para Term Preterm AB TAB SAB Ectopic Multiple Living   _0 0 2      Obstetric Comments   Previous cs for diabetes      Review of Systems  ROS: See HPI Constitutional: no fever  Eyes: no drainage  ENT: no runny nose  Cardiovascular: no chest pain  Resp: no SOB  GI: no vomiting GU: no dysuria Integumentary: no rash  Allergy: no hives   Musculoskeletal: + muscular pain Neurological: no slurred speech ROS otherwise negative   Allergies  Penicillins  Home Medications   Prior to Admission medications   Medication Sig Start Date End Date Taking? Authorizing Provider  glipiZIDE (GLUCOTROL XL) 2.5 MG 24 hr tablet Take 2 tablets (5 mg total) by mouth daily with breakfast. 09/12/14  Yes Philemon Kingdom, MD  Insulin Glargine (LANTUS SOLOSTAR) 100 UNIT/ML Solostar Pen Inject 5 Units into the skin daily at 10 pm. 09/12/14  Yes Philemon Kingdom, MD  metFORMIN (GLUCOPHAGE-XR) 500 MG 24 hr tablet Take 1 tablet (500 mg total) by mouth 3 (three) times daily with meals. Patient taking differently: Take 1,000 mg by mouth daily with breakfast.  08/11/14  Yes Caren Macadam, MD  Prenatal Vit-Fe Fumarate-FA (PRENATAL VITAMINS PLUS) 27-1 MG TABS Take 1 tablet by mouth daily. 01/23/14  Yes Osborne Oman, MD  ACCU-CHEK SOFTCLIX LANCETS lancets Use to test blood sugar 6 times daily as instructed. Dx code: K82.060 Patient not taking: Reported on 12/01/2014 08/29/14   Philemon Kingdom, MD  Blood Glucose Monitoring Suppl (ACCU-CHEK AVIVA PLUS) W/DEVICE KIT Use to test blood sugar 6 times daily as instructed. Dx code: R56.153 Patient not taking: Reported on 12/01/2014 04/24/14   Philemon Kingdom, MD  Butalbital-APAP-Caffeine [phone] MG per capsule Take 1-2 capsules by  mouth every 6 (six) hours as needed for headache. Patient not taking: Reported on 10/30/2014 08/11/14   Caren Macadam, MD  folic acid (FOLVITE) 1 MG tablet Take 1 tablet (1 mg total) by mouth daily. Patient not taking: Reported on 12/01/2014 01/23/14   Osborne Oman, MD  glucose blood (ACCU-CHEK AVIVA PLUS) test strip Use to test blood sugar 6 times daily as instructed. Dx code: O27.035 08/29/14   Philemon Kingdom, MD  ibuprofen (ADVIL,MOTRIN) 600 MG tablet Take 1 tablet (600 mg total) by mouth every 6 (six) hours. Patient taking differently: Take 600 mg by mouth every 6  (six) hours as needed for moderate pain.  08/11/14   Caren Macadam, MD  Insulin Pen Needle (CAREFINE PEN NEEDLES) 32G X 4 MM MISC Use 5x a day 07/11/14   Philemon Kingdom, MD  Insulin Syringe-Needle U-100 (INSULIN SYRINGE .5CC/31GX5/16") 31G X 5/16" 0.5 ML MISC 1 Syringe by Does not apply route 4 (four) times daily as needed. 07/03/14   Donnamae Jude, MD  Lidocaine 0.5 % GEL Apply 1 application topically as needed (pain). Patient not taking: Reported on 12/01/2014 11/13/14   Luvenia Redden, PA-C   BP 112/72 mmHg  Pulse 75  Temp(Src) 98.1 F (36.7 C) (Oral)  Resp 18  SpO2 98%  Breastfeeding? Yes Physical Exam  Constitutional: She appears well-developed and well-nourished. No distress.  HENT:  Head: Normocephalic and atraumatic.  Mouth/Throat: Uvula is midline.  Eyes: Pupils are equal, round, and reactive to light.  Neck: Normal range of motion. Neck supple. Muscular tenderness (to left paraspinal muscle) present. No spinous process tenderness present. No rigidity. Normal range of motion present. No Brudzinski's sign and no Kernig's sign noted.  Cardiovascular: Normal rate and regular rhythm.   Pulmonary/Chest: Effort normal. She has no decreased breath sounds. She has no wheezes. She has no rhonchi.  Abdominal: Soft.  Musculoskeletal:       Left shoulder: She exhibits decreased range of motion (due to pain), tenderness, pain and spasm. She exhibits no bony tenderness, no swelling, no effusion, no crepitus, no deformity, no laceration, normal pulse and normal strength.       Left elbow: Normal.       Left wrist: Normal.  No LE swelling  Lymphadenopathy:       Head (right side): No tonsillar adenopathy present.       Head (left side): No tonsillar adenopathy present.    She has cervical adenopathy.       Right cervical: No superficial cervical adenopathy present.      Left cervical: Superficial cervical adenopathy present.  Neurological: She is alert.  Skin: Skin is warm and dry.   Psychiatric: Her speech is normal and behavior is normal. Her mood appears anxious.  Nursing note and vitals reviewed.   ED Course  Procedures (including critical care time) Labs Review Labs Reviewed  CBC WITH DIFFERENTIAL/PLATELET  BASIC METABOLIC PANEL  I-STAT CHEM 8, ED  I-STAT TROPOININ, ED    Imaging Review Dg Cervical Spine Complete  12/01/2014  CLINICAL DATA:  Left lateral neck pain. EXAM: CERVICAL SPINE - COMPLETE 4+ VIEW COMPARISON:  None. FINDINGS: There is no evidence of cervical spine fracture or prevertebral soft tissue swelling. There is reversal of cervical lordosis in the lower cervical spine. The bony neural foramina are patent. No other significant bone abnormalities are identified. IMPRESSION: No evidence of fracture or subluxation. Electronically Signed   By: Fidela Salisbury M.D.   On: 12/01/2014 15:12  Dg Shoulder Left  12/01/2014  CLINICAL DATA:  Woke up today with lateral left neck pain and left shoulder pain. Pain radiates from the left upper back into the left arm. EXAM: LEFT SHOULDER - 2+ VIEW COMPARISON:  None. FINDINGS: Negative for fracture or dislocation. Visualized left ribs are intact. Left AC joint appears to be intact. There is a thin tubular structure overlying the left upper arm on the axillary view but only seen on this image. This could be outside of the patient. IMPRESSION: No acute bone abnormality to the left shoulder. Electronically Signed   By: Markus Daft M.D.   On: 12/01/2014 15:12   I have personally reviewed and evaluated these images and lab results as part of my medical decision-making.   EKG Interpretation None      MDM   Final diagnoses:  None    At end of shift patient sign out to Sharene Butters, MD the ER resident.   Labs Reviewed  CBC WITH DIFFERENTIAL/PLATELET - Abnormal; Notable for the following:    Hemoglobin 11.9 (*)    RDW 15.9 (*)    All other components within normal limits  BASIC METABOLIC PANEL  I-STAT CHEM 8, ED   I-STAT TROPOININ, ED    Medications  sodium chloride 0.9 % bolus 500 mL (0 mLs Intravenous Stopped 12/01/14 1921)  LORazepam (ATIVAN) injection 1 mg (1 mg Intravenous Given 12/01/14 1603)  oxyCODONE-acetaminophen (PERCOCET/ROXICET) 5-325 MG per tablet 1 tablet (1 tablet Oral Given 12/01/14 1704)    Pt waiting for chest xray at this time.    Delos Haring, PA-C 12/02/14 1146  Dorie Rank, MD 12/02/14 [phone]

## 2014-12-01 NOTE — ED Notes (Signed)
Spoke with patient, pharmacist and PA regarding medication and breast feeding.

## 2014-12-01 NOTE — MAU Note (Signed)
Awoke with pain L neck, left side of back and L shoulder and upper L arm. [facility]. Xrayed L shoulder and ok. Blood work. Now pt points to L upper arm where Nexplanon is which was placed in August

## 2014-12-01 NOTE — MAU Note (Signed)
Theresa NippleJulie Wenzel PA in Triage to talk with pt.

## 2014-12-01 NOTE — Discharge Instructions (Signed)
Bicipital Tendonitis Bicipital tendonitis refers to redness, soreness, and swelling (inflammation) or irritation of the bicep tendon. The biceps muscle is located between the elbow and shoulder of the inner arm. The tendon heads, similar to pieces of rope, connect the bicep muscle to the shoulder socket. They are called short head and long head tendons. When tendonitis occurs, the long head tendon is inflamed and swollen, and may be thickened or partially torn.  Bicipital tendonitis can occur with other problems as well, such as arthritis in the shoulder or acromioclavicular joints, tears in the tendons, or other rotator cuff problems.  CAUSES  Overuse of of the arms for overhead activities is the major cause of tendonitis. Many athletes, such as swimmers, baseball players, and tennis players are prone to bicipital tendonitis. Jobs that require manual labor or routine chores, especially chores involving overhead activities can result in overuse and tendonitis. SYMPTOMS Symptoms may include:  Pain in and around the front of the shoulder. Pain may be worse with overhead motion.  Pain or aching that radiates down the arm.  Clicking or shifting sensations in the shoulder. DIAGNOSIS Your caregiver may perform the following:  Physical exam and tests of the biceps and shoulder to observe range of motion, strength, and stability.  X-rays or magnetic resonance imaging (MRI) to confirm the diagnosis. In most common cases, these tests are not necessary. Since other problems may exist in the shoulder or rotator cuff, additional tests may be recommended. TREATMENT Treatment may include the following:  Medications  Your caregiver may prescribe over-the-counter pain relievers.  Steroid injections, such as cortisone, may be recommended. These may help to reduce inflammation and pain.  Physical Therapy - Your caregiver may recommend gentle exercises with the arm. These can help restore strength and range  of motion. They may be done at home or with a physical therapist's supervision and input.  Surgery - Arthroscopic or open surgery sometimes is necessary. Surgery may include:  Reattachment or repair of the tendon at the shoulder socket.  Removal of the damaged section of the tendon.  Anchoring the tendon to a different area of the shoulder (tenodesis). HOME CARE INSTRUCTIONS   Avoid overhead motion of the affected arm or any other motion that causes pain.  Take medication for pain as directed. Do not take these for more than 3 weeks, unless directed to do so by your caregiver.  Ice the affected area for 20 minutes at a time, 3-4 times per day. Place a towel on the skin over the painful area and the ice or cold pack over the towel. Do not place ice directly on the skin.  Perform gentle exercises at home as directed. These will increase strength and flexibility. PREVENTION  Modify your activities as much as possible to protect your arm. A physical therapist or sports medicine physician can help you understand options for safe motion.  Avoid repetitive overhead pulling, lifting, reaching, and throwing until your caregiver tells you it is ok to resume these activities. SEEK MEDICAL CARE IF:  Your pain worsens.  You have difficulty moving the affected arm.  You have trouble performing any of the self-care instructions. MAKE SURE YOU:   Understand these instructions.  Will watch your condition.  Will get help right away if you are not doing well or get worse.   This information is not intended to replace advice given to you by your health care provider. Make sure you discuss any questions you have with your health care provider.     Document Released: 01/25/2010 Document Revised: 03/17/2011 Document Reviewed: 07/12/2014 Elsevier Interactive Patient Education 2016 [employer]  

## 2014-12-04 ENCOUNTER — Ambulatory Visit (INDEPENDENT_AMBULATORY_CARE_PROVIDER_SITE_OTHER): Payer: Medicaid Other | Admitting: Obstetrics & Gynecology

## 2014-12-04 ENCOUNTER — Encounter: Payer: Self-pay | Admitting: Obstetrics & Gynecology

## 2014-12-04 VITALS — BP 105/84 | HR 120 | Temp 98.6°F | Ht 65.25 in | Wt 128.1 lb

## 2014-12-04 DIAGNOSIS — Z3046 Encounter for surveillance of implantable subdermal contraceptive: Secondary | ICD-10-CM

## 2014-12-04 NOTE — Progress Notes (Signed)
Subjective:     Patient ID: Theresa Koch, female   DOB: 12-04-1980, 34 y.o.   MRN: [id number]  HPI [id number] No LMP recorded. Patient has had an implant. Nexplanon has resulted in upper arm pain and she wants it removed and will practice natural family planning. I stressed she may still have pain after removal.   Review of Systems  Musculoskeletal: Positive for myalgias (left upper arm to the shoulder).       Objective:   Physical Exam  Constitutional: She is oriented to person, place, and time.  Musculoskeletal: Normal range of motion. She exhibits no tenderness.  Insertion site left upper arm not tender, no swelling  Neurological: She is alert and oriented to person, place, and time.  Psychiatric: She has a normal mood and affect. Her behavior is normal.       Assessment:     Concern that Nexplanon is causing pain and elects removal after counseling     Plan:          Patient given informed consent for removal of her Nexplanon, time out was performed.  Signed copy in the chart.  Appropriate time out taken. Nexplanon site identified.  Area prepped in usual sterile fashon. One cc of 1% lidocaine was used to anesthetize the area at the distal end of the implant. A small stab incision was made right beside the implant on the distal portion.  The rod was grasped using hemostats and removed without difficulty.  There was less than 3 cc blood loss. There were no complications.  A small amount of antibiotic ointment and steri-strips were applied over the small incision.  A pressure bandage was applied to reduce any bruising.  The patient tolerated the procedure well and was given post procedure instructions.  Adam PhenixJames G Shakedra Beam, MD 12/04/2014

## 2014-12-04 NOTE — Patient Instructions (Signed)
Etonogestrel implant What is this medicine? ETONOGESTREL (et oh noe JES trel) is a contraceptive (birth control) device. It is used to prevent pregnancy. It can be used for up to 3 years. This medicine may be used for other purposes; ask your health care provider or pharmacist if you have questions. What should I tell my health care provider before I take this medicine? They need to know if you have any of these conditions: -abnormal vaginal bleeding -blood vessel disease or blood clots -cancer of the breast, cervix, or liver -depression -gallbladder disease -headaches -heart disease or recent heart attack -high blood pressure -high cholesterol -kidney disease -liver disease -renal disease -seizures -tobacco smoker -an unusual or allergic reaction to etonogestrel, other hormones, anesthetics or antiseptics, medicines, foods, dyes, or preservatives -pregnant or trying to get pregnant -breast-feeding How should I use this medicine? This device is inserted just under the skin on the inner side of your upper arm by a health care professional. Talk to your pediatrician regarding the use of this medicine in children. Special care may be needed. Overdosage: If you think you have taken too much of this medicine contact a poison control center or emergency room at once. NOTE: This medicine is only for you. Do not share this medicine with others. What if I miss a dose? This does not apply. What may interact with this medicine? Do not take this medicine with any of the following medications: -amprenavir -bosentan -fosamprenavir This medicine may also interact with the following medications: -barbiturate medicines for inducing sleep or treating seizures -certain medicines for fungal infections like ketoconazole and itraconazole -griseofulvin -medicines to treat seizures like carbamazepine, felbamate, oxcarbazepine, phenytoin, topiramate -modafinil -phenylbutazone -rifampin -some  medicines to treat HIV infection like atazanavir, indinavir, lopinavir, nelfinavir, tipranavir, ritonavir -St. John's wort This list may not describe all possible interactions. Give your health care provider a list of all the medicines, herbs, non-prescription drugs, or dietary supplements you use. Also tell them if you smoke, drink alcohol, or use illegal drugs. Some items may interact with your medicine. What should I watch for while using this medicine? This product does not protect you against HIV infection (AIDS) or other sexually transmitted diseases. You should be able to feel the implant by pressing your fingertips over the skin where it was inserted. Contact your doctor if you cannot feel the implant, and use a non-hormonal birth control method (such as condoms) until your doctor confirms that the implant is in place. If you feel that the implant may have broken or become bent while in your arm, contact your healthcare provider. What side effects may I notice from receiving this medicine? Side effects that you should report to your doctor or health care professional as soon as possible: -allergic reactions like skin rash, itching or hives, swelling of the face, lips, or tongue -breast lumps -changes in emotions or moods -depressed mood -heavy or prolonged menstrual bleeding -pain, irritation, swelling, or bruising at the insertion site -scar at site of insertion -signs of infection at the insertion site such as fever, and skin redness, pain or discharge -signs of pregnancy -signs and symptoms of a blood clot such as breathing problems; changes in vision; chest pain; severe, sudden headache; pain, swelling, warmth in the leg; trouble speaking; sudden numbness or weakness of the face, arm or leg -signs and symptoms of liver injury like dark yellow or brown urine; general ill feeling or flu-like symptoms; light-colored stools; loss of appetite; nausea; right upper belly pain; unusually  weak  or tired; yellowing of the eyes or skin -unusual vaginal bleeding, discharge -signs and symptoms of a stroke like changes in vision; confusion; trouble speaking or understanding; severe headaches; sudden numbness or weakness of the face, arm or leg; trouble walking; dizziness; loss of balance or coordination Side effects that usually do not require medical attention (Report these to your doctor or health care professional if they continue or are bothersome.): -acne -back pain -breast pain -changes in weight -dizziness -general ill feeling or flu-like symptoms -headache -irregular menstrual bleeding -nausea -sore throat -vaginal irritation or inflammation This list may not describe all possible side effects. Call your doctor for medical advice about side effects. You may report side effects to FDA at 1-800-FDA-1088. Where should I keep my medicine? This drug is given in a hospital or clinic and will not be stored at home. NOTE: This sheet is a summary. It may not cover all possible information. If you have questions about this medicine, talk to your doctor, pharmacist, or health care provider.    2016, Elsevier/Gold Standard. (2013-10-07 14:07:06)   Natural Family Planning (NFP) is a type of birth control without using any form of contraception. Women who use NFP should not have sexual intercourse when the ovary produces an egg (ovulation) during the menstrual cycle. The NFP method is safe and can prevent pregnancy. It is 75% effective when practiced right. The man needs to also understand this method of birth control and the woman needs to be aware of how her body functions during her menstrual cycle. NFP can also be used as a method of getting pregnant.  HOW THE NFP METHOD WORKS  A woman's menstrual period usually happens every 28-30 days (it can vary from 23-35 days).  Ovulation happens 12-14 days before the start of the next menstrual period (the fertile period). The egg is fertile  for 24 hours and the sperm can live for 3 days or more. If there is sexual intercourse at this time, pregnancy can occur. THERE ARE MANY TYPES OF NFP METHODS USED TO PREVENT PREGNANCY  The basal body temperature method. Often times, there is a slight increase of body temperature when a woman ovulates. Take your temperature every morning before getting out of bed. Write the temperature on a chart. An increase in the temperature shows ovulation has happened. Do not have sexual intercourse from the menstrual period up to three days after the increase in the temperature. Note that the body temperature may increase as a result of fever, restless sleep, and working schedules.  The ovulation cervical mucus method. During the menstrual cycle, the cervical mucus changes from dry and sticky to wet and slippery. Check the mucus of the vagina every day to look for these changes. Just before ovulation, the mucus becomes wet and slippery. On the last day of wetness, ovulation happens. To avoid getting pregnant, sexual intercourse is safe for about 10 days after the menstrual period and on the dry mucus days. Do not have sexual intercourse when the mucus starts to show up and not until 4 days after the wet and slippery mucus goes away. Sexual intercourse after the 4 days have passed until the menstrual period starts is a safe time. Note that the mucus from the vagina can increase because of a vaginal or cervical infection, lubricants, some medicines, and sexual excitement.  The symptothermal method. This method uses both the temperature and the ovulation methods. Combine the two methods above to prevent pregnancy.  The calendar method.  Record your menstrual periods and length of the cycles for 6 months. This is helpful when the menstrual cycle varies in the length of the cycle. The length of a menstrual cycle is from day 1 of the present menstrual period to day 1 of the next menstrual period. Then, find your fertile days  of the month and do not have sexual intercourse during that time. You may need help from your health care provider to find out your fertile days. There are some signs of ovulation that may be helpful when trying to find the time of ovulation. This includes vaginal spotting or abdominal cramps during the middle of your menstrual cycle. Not all women have these symptoms. YOU SHOULD NOT USE NFP IF:  You have very irregular menstrual periods and may skip months.  You have abnormal bleeding.  You have a vaginal or cervical infection.  You are on medicines that can affect the vaginal mucus or body temperature. These medicines include antibiotics, thyroid medicines, and antihistamines (cold and allergy medicine).   This information is not intended to replace advice given to you by your health care provider. Make sure you discuss any questions you have with your health care provider.   Document Released: 06/11/2007 Document Revised: 12/28/2012 Document Reviewed: 06/25/2012 Elsevier Interactive Patient Education [employer]

## 2014-12-11 ENCOUNTER — Ambulatory Visit: Payer: Medicaid Other | Admitting: Medical

## 2014-12-12 ENCOUNTER — Other Ambulatory Visit: Payer: Self-pay | Admitting: Internal Medicine

## 2014-12-12 MED ORDER — GLIPIZIDE ER 5 MG PO TB24: 5.0000 mg | ORAL_TABLET | Freq: Every day | ORAL | Status: DC

## 2014-12-18 ENCOUNTER — Encounter: Payer: Self-pay | Admitting: Internal Medicine

## 2014-12-18 ENCOUNTER — Other Ambulatory Visit: Payer: Self-pay | Admitting: *Deleted

## 2014-12-18 MED ORDER — GLIPIZIDE ER 5 MG PO TB24: 5.0000 mg | ORAL_TABLET | Freq: Every day | ORAL | Status: DC

## 2014-12-18 MED ORDER — METFORMIN HCL ER 500 MG PO TB24: 1000.0000 mg | ORAL_TABLET | Freq: Every day | ORAL | Status: DC

## 2014-12-19 ENCOUNTER — Other Ambulatory Visit: Payer: Self-pay | Admitting: *Deleted

## 2014-12-19 MED ORDER — INSULIN PEN NEEDLE 32G X 4 MM MISC: Status: DC

## 2014-12-25 ENCOUNTER — Other Ambulatory Visit: Payer: Self-pay | Admitting: Rehabilitation

## 2014-12-25 DIAGNOSIS — M542 Cervicalgia: Secondary | ICD-10-CM

## 2015-01-05 ENCOUNTER — Other Ambulatory Visit: Payer: Medicaid Other

## 2015-01-26 ENCOUNTER — Ambulatory Visit: Payer: Medicaid Other | Admitting: Internal Medicine

## 2015-02-09 ENCOUNTER — Ambulatory Visit (INDEPENDENT_AMBULATORY_CARE_PROVIDER_SITE_OTHER): Payer: Self-pay | Admitting: Internal Medicine

## 2015-02-09 ENCOUNTER — Encounter: Payer: Self-pay | Admitting: Internal Medicine

## 2015-02-09 ENCOUNTER — Other Ambulatory Visit: Payer: Self-pay | Admitting: *Deleted

## 2015-02-09 VITALS — BP 112/62 | HR 65 | Temp 98.2°F | Ht 65.0 in | Wt 132.4 lb

## 2015-02-09 DIAGNOSIS — E1165 Type 2 diabetes mellitus with hyperglycemia: Secondary | ICD-10-CM | POA: Diagnosis not present

## 2015-02-09 DIAGNOSIS — Z794 Long term (current) use of insulin: Secondary | ICD-10-CM | POA: Diagnosis not present

## 2015-02-09 LAB — HEMOGLOBIN A1C
HEMOGLOBIN A1C: 8.1 % — AB (ref <5.7)
Mean Plasma Glucose: 186 mg/dL — ABNORMAL HIGH (ref <117)

## 2015-02-09 MED ORDER — INSULIN GLARGINE 100 UNIT/ML SOLOSTAR PEN: 5.0000 [IU] | PEN_INJECTOR | Freq: Every day | SUBCUTANEOUS | Status: DC

## 2015-02-09 MED ORDER — METFORMIN HCL ER 500 MG PO TB24: 500.0000 mg | ORAL_TABLET | Freq: Three times a day (TID) | ORAL | Status: DC

## 2015-02-09 NOTE — Patient Instructions (Signed)
Please move Lantus 5 units to am.  Take Novolog 2-3 units before each of the 3 meals.  Continue Metformin ER 500 mg 3x a day.   Please stop at the lab.  Please come back for a follow-up appointment in 1.5 months.

## 2015-02-09 NOTE — Progress Notes (Signed)
Pre visit review using our clinic review tool, if applicable. No additional management support is needed unless otherwise documented below in the visit note. 

## 2015-02-09 NOTE — Progress Notes (Addendum)
Patient ID: Theresa Koch, female   DOB: 1980/08/09, 35 y.o.   MRN: [id number]  HPI: Theresa Koch is a 35 y.o.-year-old female, initially referred by Dr. Synetta Shadow, returning for f/u for DM2, dx in 2011, insulin-dependent since end 2013, uncontrolled, without complications. Last visit 5 mo ago. BCBS now.  She had an episode of mm pain and weakness in 11/2014 >> paroxystic now.  She gave birth on 08/07/2014. Daughter is healthy. She is still breastfeeding. No low CBG with breastfeeding.  Last hemoglobin A1c was: Lab Results  Component Value Date   HGBA1C 6.5 08/29/2014   HGBA1C 6.2 05/12/2014   HGBA1C 7.6* 02/14/2014   After pregnancy, she was on: - NPH 4 units 2x a day - Metformin XR 500 mg 3x Used some NovoLog if sugars were high.  She is now on:  She was out of one or more of the meds below in last 5 mo - now on the first 3: - Lantus 7 >> 5 units at bedtime - Metformin XR 500 mg 3x a day with meals. - NovoLog 3 units before meals - started 2 days ago - Glipizide XL 5 mg in am >> not working >> stopped  Pt checks her sugars 2-3x a day and they are: - am: 58-80 >> 59, 75-95, 113 >> 73-117, 128 >> 95-140, 152 >> 69-107, 121 >> 78-139, 206 >> 98-156 - 2h after b'fast: 72-157, 209 >> 110-211, 264 >> 71-174 >> 60 (novolog)-180, 197 >> 165-227 - before lunch: n/c >> 53-116 >>  59, 66-113 >> 64-102 >> 74-140 >> 54-118, 127 >> 117, 151, 250 >> 148-183 - 2h after lunch: 97-169, 221 >> 65, 86, 105-192 >> 84-134 >> 107-228, 267 >> 70-112 >> 150-222 >> 161-312 - before dinner:76, 86-135, 153, 180 >> 78-139, 163 >> 71-171 >> 57-104, 183 >> 95, 102 >> 88-159, 180 - 2h after dinner: 134-179 >> 93-159, 185 >> 109-167, 182 >> 133-266 >> 63-160, 189 >> 187-314 >> 120, 142-202 - bedtime: n/c >> 148 >> 185 >> 50-134 >> 71 >> 126-222 - nighttime: 56, 70-90 >> 79, 91 >> n/c >> 62-134, 172 >> n/c >> 48x1 No lows. Lowest sugar was 46 >> 53 >> 59 >> 64 >> 71 >> 34 (6 units of NPH) >> 78 >> 48; she has  hypoglycemia awareness at 60.  Highest sugar was 221 >> 217x1 >> 209 >> 238x1 >> 314 >> 300s.   Glucometer: One Touch Ultra  Pt's meals are: - Breakfast: tea with milk (nausea)  - Lunch: salad, soup, cheese - takes 70/30 before this - Dinner: egg, cheese, sometimes cereals - Snacks:   - no CKD, no BUN/creatinine: Lab Results  Component Value Date   BUN 16 12/01/2014   Lab Results  Component Value Date   CREATININE 0.48 12/01/2014   - no h/o HL No results found for: CHOL, HDL, LDLCALC, LDLDIRECT, TRIG, CHOLHDL  - last eye exam was on 03/20/2014. No DR.  - no numbness and tingling in her feet.  ROS: Constitutional: no weight gain/loss, + fatigue, no subjective hyperthermia, no nocturia  Eyes: no blurry vision, no xerophthalmia ENT: no sore throat, no nodules palpated in throat, no dysphagia/odynophagia, no hoarseness Cardiovascular: no CP/SOB/palpitations/leg swelling Respiratory: no cough/SOB Gastrointestinal: no N/V/D/C Musculoskeletal: no muscle/joint aches Skin: no rashes Neurological: no tremors/numbness/tingling/dizziness  I reviewed pt's medications, allergies, PMH, social hx, family hx, and changes were documented in the history of present illness. Otherwise, unchanged from my initial visit note:  Past Medical  History  Diagnosis Date  . Diabetes mellitus without complication [facility])    Past Surgical History  Procedure Laterality Date  . Cesarean section     History   Social History  . Marital Status: Married    Spouse Name: N/A    Number of Children: 1   Occupational History  . 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 Smoking status: Never Smoker   . Smokeless tobacco: Not on file  . Alcohol Use: No  . Drug Use: No   Current Outpatient Prescriptions on File Prior to Visit  Medication Sig Dispense Refill  . ACCU-CHEK SOFTCLIX LANCETS lancets Use to test blood sugar 6 times daily as instructed. Dx code: C78.938  200 each 3  . Blood Glucose Monitoring Suppl (ACCU-CHEK AVIVA PLUS) W/DEVICE KIT Use to test blood sugar 6 times daily as instructed. Dx code: O24.912 1 kit 0  . cyclobenzaprine (FLEXERIL) 10 MG tablet Take 1 tablet (10 mg total) by mouth 2 (two) times daily as needed (for pain). 20 tablet 0  . glipiZIDE (GLIPIZIDE XL) 5 MG 24 hr tablet Take 1 tablet (5 mg total) by mouth daily with breakfast. 30 tablet 1  . glucose blood (ACCU-CHEK AVIVA PLUS) test strip Use to test blood sugar 6 times daily as instructed. Dx code: B01.751 200 each 3  . Insulin Glargine (LANTUS SOLOSTAR) 100 UNIT/ML Solostar Pen Inject 5 Units into the skin daily at 10 pm. 5 pen 1  . Insulin Pen Needle (CAREFINE PEN NEEDLES) 32G X 4 MM MISC Use 5x a day 200 each 3  . Insulin Syringe-Needle U-100 (INSULIN SYRINGE .5CC/31GX5/16") 31G X 5/16" 0.5 ML MISC 1 Syringe by Does not apply route 4 (four) times daily as needed. 100 each 3  . metFORMIN (GLUCOPHAGE-XR) 500 MG 24 hr tablet Take 2 tablets (1,000 mg total) by mouth daily with breakfast. 60 tablet 1  . ibuprofen (ADVIL,MOTRIN) 600 MG tablet Take 1 tablet (600 mg total) by mouth every 6 (six) hours. (Patient not taking: Reported on 02/09/2015) 90 tablet 0  . Lidocaine 0.5 % GEL Apply 1 application topically as needed (pain). (Patient not taking: Reported on 02/09/2015) 1 Tube 0   No current facility-administered medications on file prior to visit.   Allergies  Allergen Reactions  . Penicillins Rash    Family History  Problem Relation Age of Onset  . Diabetes Father   . Kidney disease Father   . Diabetes Sister   . Diabetes Brother    PE: BP 112/62 mmHg  Pulse 65  Temp(Src) 98.2 F (36.8 C) (Oral)  Ht 5' 5" (1.651 m)  Wt 132 lb 6 oz (60.045 kg)  BMI 22.03 kg/m2  SpO2 98%  Breastfeeding? Yes Wt Readings from Last 3 Encounters:  02/09/15 132 lb 6 oz (60.045 kg)  12/04/14 128 lb 1.6 oz (58.106 kg)  12/01/14 132 lb 9.6 oz (60.147 kg)   Constitutional: normal weight, in  NAD Eyes: PERRLA, EOMI, no exophthalmos ENT: moist mucous membranes, no thyromegaly, no cervical lymphadenopathy Cardiovascular: RRR, No MRG Respiratory: CTA B Gastrointestinal: abdomen soft, NT, ND, BS+ Musculoskeletal: no deformities, strength intact in all 4 Skin: moist, warm, no rashes Neurological: no tremor with outstretched hands, DTR normal in all 4  ASSESSMENT: 1. DM2, insulin-dependent, uncontrolled, without complications  PLAN:  1. Patient with several years h/o diabetes, with fluctuating sugars and increased insulin sensitivity >> pattern similar to DM1 >> we will check her  for DM1 today, as her CBG in the office (checked with her meter) is 100.  - She has had problems with her insurance over the last few months, and has varied the medications that she was on, recently she took Lantus 5 units at night and added NovoLog 3 units with meals instead of glipizide, as she noticed that glipizide is not helping too much. The first night after she started this regimen, she had the hypoglycemic episode, at 48 during the night. We will move Lantus in a.m. and continue it at 5 units, and I advised her to take 2-3 units of NovoLog before each of the 3 meals. In the future, she may need a Luxura pen or a Novopen Echo so she can inject half units. - For now, I suggested to:  Patient Instructions  Please move Lantus 5 units to am.  Take Novolog 2-3 units before each of the 3 meals.  Continue Metformin ER 500 mg 3x a day.   Please stop at the lab.  Please come back for a follow-up appointment in 1.5 months.  - continue checking sugars at different times of the day - check 2-3 times a day, rotating checks - Today we will check the following labs: Orders Placed This Encounter  Procedures  . C-peptide  . Glucose, Fasting  . Glutamic acid decarboxylase auto abs  . Anti-islet cell antibody    hemoglobin A1c - Return to clinic in 1.5 mo with sugar log.  Office Visit on 02/09/2015   Component Date Value Ref Range Status  . C-Peptide 02/09/2015 0.24* 0.80 - 3.90 ng/mL Final  . Glucose, Fasting 02/09/2015 64* 65 - 99 mg/dL Final   Comment:     < 100  mg/dL = normal fasting glucose 100-125  mg/dL = IFG (impaired fasting glucose)   > 125  mg/dL = provisional diagnosis of diabetes     . Glutamic Acid Decarb Ab 02/09/2015 42* <5 IU/mL Final   Comment: This test was performed using the GAD65 ELISA method. New method, ELISA, is standardized against the International reference preparation 97/550, is reported in International Units/mL (IU/mL) and a new reference range was implemented.   . Pancreatic Islet Cell Antibody 02/09/2015 <5  < 5 JDF Units Final   Comment:    Laboratory Developed Test performed using a reagent labeled by  the manufacturer as ASR Class I or ASR Class II (non-blood bank).    This test(s) was developed and its performance characteristics  have been determined by [employer]. It has not been cleared or approved by the [place] Food and  Drug Administration. The FDA has determined that such clearance  or approval is not necessary. Performance characteristics refer  to the analytical performance of the test.   . Hgb A1c MFr Bld 02/09/2015 8.1* <5.7 % Final   Comment:                                                                        According to the ADA Clinical Practice Recommendations for 2011, when HbA1c is used as a screening test:     >=6.5%   Diagnostic of Diabetes Mellitus            (  if abnormal result is confirmed)   5.7-6.4%   Increased risk of developing Diabetes Mellitus   References:Diagnosis and Classification of Diabetes Mellitus,Diabetes IRSW,5462,70(JJKKX 1):S62-S69 and Standards of Medical Care in         Diabetes - 2011,Diabetes FGHW,2993,71 (Suppl 1):S11-S61.     . Mean Plasma Glucose 02/09/2015 186* <117 mg/dL Final   Dear Ms Jordan Likes, Labs confirm type 1 diabetes: high pancreatic  antibodies and low C peptide. Let's give it a try without Metformin. Sincerely, Philemon Kingdom MD

## 2015-02-10 LAB — C-PEPTIDE: C PEPTIDE: 0.24 ng/mL — AB (ref 0.80–3.90)

## 2015-02-10 LAB — GLUCOSE, FASTING: Glucose, Fasting: 64 mg/dL — ABNORMAL LOW (ref 65–99)

## 2015-02-13 LAB — GLUTAMIC ACID DECARBOXYLASE AUTO ABS: Glutamic Acid Decarb Ab: 42 IU/mL — ABNORMAL HIGH (ref <5)

## 2015-02-15 LAB — ANTI-ISLET CELL ANTIBODY: Pancreatic Islet Cell Antibody: <5 JDF Units (ref <5)

## 2015-03-09 ENCOUNTER — Ambulatory Visit: Payer: Self-pay | Admitting: Internal Medicine

## 2015-04-02 ENCOUNTER — Ambulatory Visit: Payer: BLUE CROSS/BLUE SHIELD | Admitting: Internal Medicine

## 2015-04-04 ENCOUNTER — Ambulatory Visit: Payer: BLUE CROSS/BLUE SHIELD | Admitting: Internal Medicine

## 2015-05-23 ENCOUNTER — Encounter: Payer: Self-pay | Admitting: Internal Medicine

## 2015-05-23 ENCOUNTER — Ambulatory Visit (INDEPENDENT_AMBULATORY_CARE_PROVIDER_SITE_OTHER): Payer: BLUE CROSS/BLUE SHIELD | Admitting: Internal Medicine

## 2015-05-23 ENCOUNTER — Other Ambulatory Visit (INDEPENDENT_AMBULATORY_CARE_PROVIDER_SITE_OTHER): Payer: BLUE CROSS/BLUE SHIELD | Admitting: *Deleted

## 2015-05-23 VITALS — BP 112/62 | HR 67 | Temp 98.6°F | Resp 12 | Wt 135.8 lb

## 2015-05-23 DIAGNOSIS — E1065 Type 1 diabetes mellitus with hyperglycemia: Secondary | ICD-10-CM | POA: Diagnosis not present

## 2015-05-23 HISTORY — DX: Type 1 diabetes mellitus with hyperglycemia: E10.65

## 2015-05-23 LAB — COMPLETE METABOLIC PANEL WITH GFR
ALBUMIN: 4.1 g/dL (ref 3.6–5.1)
ALT: 10 U/L (ref 6–29)
AST: 16 U/L (ref 10–30)
Alkaline Phosphatase: 52 U/L (ref 33–115)
BILIRUBIN TOTAL: 0.3 mg/dL (ref 0.2–1.2)
BUN: 17 mg/dL (ref 7–25)
CALCIUM: 8.8 mg/dL (ref 8.6–10.2)
CHLORIDE: 105 mmol/L (ref 98–110)
CO2: 27 mmol/L (ref 20–31)
Creat: 0.52 mg/dL (ref 0.50–1.10)
Glucose, Bld: 103 mg/dL — ABNORMAL HIGH (ref 65–99)
Potassium: 4 mmol/L (ref 3.5–5.3)
Sodium: 139 mmol/L (ref 135–146)
Total Protein: 6.7 g/dL (ref 6.1–8.1)

## 2015-05-23 LAB — LIPID PANEL
CHOL/HDL RATIO: 3
CHOLESTEROL: 137 mg/dL (ref 0–200)
HDL: 50.9 mg/dL (ref >39.00)
LDL CALC: 76 mg/dL (ref 0–99)
NonHDL: 86.38
TRIGLYCERIDES: 52 mg/dL (ref 0.0–149.0)
VLDL: 10.4 mg/dL (ref 0.0–40.0)

## 2015-05-23 LAB — POCT GLYCOSYLATED HEMOGLOBIN (HGB A1C): Hemoglobin A1C: 7.1

## 2015-05-23 LAB — TSH: TSH: 1.11 u[IU]/mL (ref 0.35–4.50)

## 2015-05-23 LAB — MICROALBUMIN / CREATININE URINE RATIO
CREATININE, U: 94.7 mg/dL
Microalb Creat Ratio: 0.7 mg/g (ref 0.0–30.0)
Microalb, Ur: <0.7 mg/dL (ref 0.0–1.9)

## 2015-05-23 NOTE — Progress Notes (Addendum)
Patient ID: Theresa Koch, female   DOB: Nov 13, 1980, 35 y.o.   MRN: [id number]  HPI: Theresa Koch is a 35 y.o.-year-old female, initially referred by Dr. Synetta Shadow, returning for f/u for DM2, dx in 2011, insulin-dependent since end 2013, uncontrolled, without complications. Last visit 5 mo ago. BCBS now.  Last hemoglobin A1c was: Lab Results  Component Value Date   HGBA1C 8.1* 02/09/2015   HGBA1C 6.5 08/29/2014   HGBA1C 6.2 05/12/2014   At last visit, we confirmed DM1: Component     Latest Ref Rng 02/09/2015  C-Peptide     0.80 - 3.90 ng/mL 0.24 (L)  Glucose, Fasting     65 - 99 mg/dL 64 (L)  Glutamic Acid Decarb Ab     <5 IU/mL 42 (H)  Pancreatic Islet Cell Antibody     < 5 JDF Units <5   After pregnancy, she was on: - NPH 4 units 2x a day - Metformin XR 500 mg 3x Used some NovoLog if sugars were high.  She is now on: - Lantus 7 >> 5 units at night - NovoLog 2-4 units before meals We stopped Metformin XR 500 mg 3x a day with meals. We stopped Glipizide XL 5 mg in am.  Pt checks her sugars 2-3x a day and they are: - am: 59, 75-95, 113 >> 73-117, 128 >> 95-140, 152 >> 69-107, 121 >> 78-139, 206 >> 98-156 >> 62x1, 77-128, 133 - 2h after b'fast: 72-157, 209 >> 110-211, 264 >> 71-174 >> 60 (novolog)-180, 197 >> 165-227 >> 101-153, 180 - before lunch: 59, 66-113 >> 64-102 >> 74-140 >> 54-118, 127 >> 117, 151, 250 >> 148-183 >> 99-152, 203 - 2h after lunch: 65, 86, 105-192 >> 84-134 >> 107-228, 267 >> 70-112 >> 150-222 >> 161-312 >> 80, 81, 151-203, 224 - before dinner:78-139, 163 >> 71-171 >> 57-104, 183 >> 95, 102 >> 88-159, 180 >> 70, 74, 201, 215 - 2h after dinner: 109-167, 182 >> 133-266 >> 63-160, 189 >> 187-314 >> 120, 142-202 >> 83-171, 206, 314 (dessert) - bedtime: n/c >> 148 >> 185 >> 50-134 >> 71 >> 126-222 >> 166 - nighttime: 56, 70-90 >> 79, 91 >> n/c >> 62-134, 172 >> n/c >> 48x1 >> n/c Lowest sugar was 34 (6 units of NPH) >> 78 >> 48 >> 62x1; she has hypoglycemia  awareness at 60.  Highest sugar was 300s >> 314 (dessert)  Glucometer: One Touch Ultra  Pt's meals are: - Breakfast: tea with milk (nausea)  - Lunch: salad, soup, cheese - takes 70/30 before this - Dinner: egg, cheese, sometimes cereals - Snacks:   - no CKD, no BUN/creatinine: Lab Results  Component Value Date   BUN 16 12/01/2014   Lab Results  Component Value Date   CREATININE 0.48 12/01/2014   - no h/o HL No results found for: CHOL, HDL, LDLCALC, LDLDIRECT, TRIG, CHOLHDL  - last eye exam was on 03/2015. No DR.  - no numbness and tingling in her feet.  Last tsh: Lab Results  Component Value Date   TSH 1.056 01/23/2014   She is still b'feeding.  ROS: Constitutional: no weight gain/loss, + fatigue, no subjective hyperthermia, no nocturia  Eyes: no blurry vision, no xerophthalmia ENT: no sore throat, no nodules palpated in throat, no dysphagia/odynophagia, no hoarseness Cardiovascular: no CP/SOB/palpitations/leg swelling Respiratory: no cough/SOB Gastrointestinal: no N/V/D/C Musculoskeletal: no muscle/joint aches Skin: no rashes Neurological: no tremors/numbness/tingling/dizziness  I reviewed pt's medications, allergies, PMH, social hx, family hx, and changes were  documented in the history of present illness. Otherwise, unchanged from my initial visit note:  Past Medical History  Diagnosis Date  . Diabetes mellitus without complication [facility])    Past Surgical History  Procedure Laterality Date  . Cesarean section     History   Social History  . Marital Status: Married    Spouse Name: N/A    Number of Children: 1   Occupational History  . 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 Smoking status: Never Smoker   . Smokeless tobacco: Not on file  . Alcohol Use: No  . Drug Use: No   Current Outpatient Prescriptions on File Prior to Visit  Medication Sig Dispense Refill  . ACCU-CHEK SOFTCLIX LANCETS lancets Use to  test blood sugar 6 times daily as instructed. Dx code: K53.976 200 each 3  . Blood Glucose Monitoring Suppl (ACCU-CHEK AVIVA PLUS) W/DEVICE KIT Use to test blood sugar 6 times daily as instructed. Dx code: O24.912 1 kit 0  . cyclobenzaprine (FLEXERIL) 10 MG tablet Take 1 tablet (10 mg total) by mouth 2 (two) times daily as needed (for pain). 20 tablet 0  . glucose blood (ACCU-CHEK AVIVA PLUS) test strip Use to test blood sugar 6 times daily as instructed. Dx code: O24.912 200 each 3  . ibuprofen (ADVIL,MOTRIN) 600 MG tablet Take 1 tablet (600 mg total) by mouth every 6 (six) hours. (Patient not taking: Reported on 02/09/2015) 90 tablet 0  . Insulin Glargine (LANTUS SOLOSTAR) 100 UNIT/ML Solostar Pen Inject 5 Units into the skin daily before breakfast. 5 pen 1  . Insulin Pen Needle (CAREFINE PEN NEEDLES) 32G X 4 MM MISC Use 5x a day 200 each 3  . Insulin Syringe-Needle U-100 (INSULIN SYRINGE .5CC/31GX5/16") 31G X 5/16" 0.5 ML MISC 1 Syringe by Does not apply route 4 (four) times daily as needed. 100 each 3  . Lidocaine 0.5 % GEL Apply 1 application topically as needed (pain). (Patient not taking: Reported on 02/09/2015) 1 Tube 0  . metFORMIN (GLUCOPHAGE-XR) 500 MG 24 hr tablet Take 1 tablet (500 mg total) by mouth 3 (three) times daily with meals. 60 tablet 1   No current facility-administered medications on file prior to visit.   Allergies  Allergen Reactions  . Penicillins Rash    Family History  Problem Relation Age of Onset  . Diabetes Father   . Kidney disease Father   . Diabetes Sister   . Diabetes Brother    PE: BP 112/62 mmHg  Pulse 67  Temp(Src) 98.6 F (37 C) (Oral)  Resp 12  Wt 135 lb 12.8 oz (61.598 kg)  SpO2 98% Wt Readings from Last 3 Encounters:  05/23/15 135 lb 12.8 oz (61.598 kg)  02/09/15 132 lb 6 oz (60.045 kg)  12/04/14 128 lb 1.6 oz (58.106 kg)   Constitutional: normal weight, in NAD Eyes: PERRLA, EOMI, no exophthalmos ENT: moist mucous membranes, no  thyromegaly, no cervical lymphadenopathy Cardiovascular: RRR, No MRG Respiratory: CTA B Gastrointestinal: abdomen soft, NT, ND, BS+ Musculoskeletal: no deformities, strength intact in all 4 Skin: moist, warm, no rashes Neurological: no tremor with outstretched hands, DTR normal in all 4  ASSESSMENT: 1. DM1, uncontrolled, without long term complications except hypo-hyperglycemia  PLAN:  1. Patient with several years h/o diabetes, recently dx'ed with DM1 (reviewed these labs with her). Her sugars are better controlled, with few spikes. However, no more lows! She only had a CBG of 62, this  am.  - will continue current doses of insulin for now, but will also refer her to DM education to discuss about pumps. - In the future, she may need a Luxura pen or a Novopen Echo so she can inject half units. - For now, I suggested to:  Patient Instructions  Please continue: - Lantus 5 units at bedtime - Novolog 2-4 units before each meal  Please schedule an appt with Leonia Reader for a discussion about pumps.  Please stop at the lab.  Please return in 3 months with your sugar log.   - continue checking sugars at different times of the day - check 2-3 times a day, rotating checks - we checked a HbA1c today >> 7.1% (better) - Return to clinic in 3 mo with sugar log.  Orders Only on 05/23/2015  Component Date Value Ref Range Status  . Hemoglobin A1C 05/23/2015 7.1   Final  Office Visit on 05/23/2015  Component Date Value Ref Range Status  . TSH 05/23/2015 1.11  0.35 - 4.50 uIU/mL Final  . Microalb, Ur 05/23/2015 <0.7  0.0 - 1.9 mg/dL Final  . Creatinine,U 05/23/2015 94.7   Final  . Microalb Creat Ratio 05/23/2015 0.7  0.0 - 30.0 mg/g Final  . Sodium 05/23/2015 139  135 - 146 mmol/L Final  . Potassium 05/23/2015 4.0  3.5 - 5.3 mmol/L Final  . Chloride 05/23/2015 105  98 - 110 mmol/L Final  . CO2 05/23/2015 27  20 - 31 mmol/L Final  . Glucose, Bld 05/23/2015 103* 65 - 99 mg/dL Final  . BUN  05/23/2015 17  7 - 25 mg/dL Final  . Creat 05/23/2015 0.52  0.50 - 1.10 mg/dL Final  . Total Bilirubin 05/23/2015 0.3  0.2 - 1.2 mg/dL Final  . Alkaline Phosphatase 05/23/2015 52  33 - 115 U/L Final  . AST 05/23/2015 16  10 - 30 U/L Final  . ALT 05/23/2015 10  6 - 29 U/L Final  . Total Protein 05/23/2015 6.7  6.1 - 8.1 g/dL Final  . Albumin 05/23/2015 4.1  3.6 - 5.1 g/dL Final  . Calcium 05/23/2015 8.8  8.6 - 10.2 mg/dL Final  . GFR, Est African American 05/23/2015 >89  >=60 mL/min Final  . GFR, Est Non African American 05/23/2015 >89  >=60 mL/min Final   Comment:   The estimated GFR is a calculation valid for adults (>=61 years old) that uses the CKD-EPI algorithm to adjust for age and sex. It is   not to be used for children, pregnant women, hospitalized patients,    patients on dialysis, or with rapidly changing kidney function. According to the NKDEP, eGFR >89 is normal, 60-89 shows mild impairment, 30-59 shows moderate impairment, 15-29 shows severe impairment and <15 is ESRD.     Marland Kitchen Cholesterol 05/23/2015 137  0 - 200 mg/dL Final   ATP III Classification       Desirable:  < 200 mg/dL               Borderline High:  200 - 239 mg/dL          High:  > = 240 mg/dL  . Triglycerides 05/23/2015 52.0  0.0 - 149.0 mg/dL Final   Normal:  <150 mg/dLBorderline High:  150 - 199 mg/dL  . HDL 05/23/2015 50.90  >39.00 mg/dL Final  . VLDL 05/23/2015 10.4  0.0 - 40.0 mg/dL Final  . LDL Cholesterol 05/23/2015 76  0 - 99 mg/dL Final  . Total CHOL/HDL Ratio  05/23/2015 3   Final                  Men          Women1/2 Average Risk     3.4          3.3Average Risk          5.0          4.42X Average Risk          9.6          7.13X Average Risk          15.0          11.0                      . NonHDL 05/23/2015 86.38   Final   NOTE:  Non-HDL goal should be 30 mg/dL higher than patient's LDL goal (i.e. LDL goal of < 70 mg/dL, would have non-HDL goal of < 100 mg/dL)   Excellent results!

## 2015-05-23 NOTE — Patient Instructions (Signed)
Please continue: - Lantus 5 units at bedtime - Novolog 2-4 units before each meal  Please schedule an appt with Cristy FolksLinda Spagnola for a discussion about pumps.  Please stop at the lab.  Please return in 3 months with your sugar log.

## 2015-08-23 ENCOUNTER — Ambulatory Visit: Payer: BLUE CROSS/BLUE SHIELD | Admitting: Internal Medicine

## 2015-09-11 ENCOUNTER — Other Ambulatory Visit: Payer: Self-pay | Admitting: Internal Medicine

## 2015-09-12 ENCOUNTER — Telehealth: Payer: Self-pay | Admitting: General Practice

## 2015-09-12 NOTE — Telephone Encounter (Signed)
Pt called needing to get a refill novolog [facility] [address]

## 2015-09-13 ENCOUNTER — Telehealth: Payer: Self-pay | Admitting: Internal Medicine

## 2015-09-13 ENCOUNTER — Telehealth: Payer: Self-pay

## 2015-09-13 NOTE — Telephone Encounter (Signed)
Called and left patient a message advising to return call to notify me which medication she needed for today and I would be glad to send it in. Gave call back number.

## 2015-09-13 NOTE — Telephone Encounter (Signed)
Patient is calling on the status of her medication, she stated she need it today.

## 2015-09-14 ENCOUNTER — Encounter: Payer: Self-pay | Admitting: Internal Medicine

## 2015-09-14 ENCOUNTER — Ambulatory Visit (INDEPENDENT_AMBULATORY_CARE_PROVIDER_SITE_OTHER): Payer: BLUE CROSS/BLUE SHIELD | Admitting: Internal Medicine

## 2015-09-14 VITALS — BP 98/60 | HR 79 | Ht 65.0 in | Wt 135.0 lb

## 2015-09-14 DIAGNOSIS — E1065 Type 1 diabetes mellitus with hyperglycemia: Secondary | ICD-10-CM | POA: Diagnosis not present

## 2015-09-14 DIAGNOSIS — N926 Irregular menstruation, unspecified: Secondary | ICD-10-CM

## 2015-09-14 DIAGNOSIS — N910 Primary amenorrhea: Secondary | ICD-10-CM | POA: Diagnosis not present

## 2015-09-14 LAB — POCT GLYCOSYLATED HEMOGLOBIN (HGB A1C): HEMOGLOBIN A1C: 7.2

## 2015-09-14 MED ORDER — INSULIN NPH (HUMAN) (ISOPHANE) 100 UNIT/ML ~~LOC~~ SUSP: SUBCUTANEOUS | 5 refills | Status: DC

## 2015-09-14 MED ORDER — GLUCOSE BLOOD VI STRP: ORAL_STRIP | 3 refills | Status: DC

## 2015-09-14 MED ORDER — ACCU-CHEK SOFTCLIX LANCETS MISC: 3 refills | Status: DC

## 2015-09-14 MED ORDER — INSULIN PEN NEEDLE 32G X 4 MM MISC: 3 refills | Status: DC

## 2015-09-14 NOTE — Progress Notes (Signed)
Patient ID: Theresa Koch, female   DOB: November 23, 1980, 35 y.o.   MRN: [id number]  HPI: Theresa Koch is a 35 y.o.-year-old female, initially referred by Dr. Synetta Shadow, returning for f/u for DM2, dx in 2011, insulin-dependent since end 2013, uncontrolled, without complications. Last visit 4 mo ago. BCBS now.  She is very distressed today >> cycle is late >> UPT positive at home. Her youngest child is 55 y/o and she is still b'feeding.  Last hemoglobin A1c was: Lab Results  Component Value Date   HGBA1C 7.1 05/23/2015   HGBA1C 8.1 (H) 02/09/2015   HGBA1C 6.5 08/29/2014   At last visit, we confirmed DM1: Component     Latest Ref Rng 02/09/2015  C-Peptide     0.80 - 3.90 ng/mL 0.24 (L)  Glucose, Fasting     65 - 99 mg/dL 64 (L)  Glutamic Acid Decarb Ab     <5 IU/mL 42 (H)  Pancreatic Islet Cell Antibody     < 5 JDF Units <5   She on: - Lantus 5 units at night  - NovoLog 2-4 units before meals >> ran out >> NPH 6 units 2x a day as she had this in her fridge. We stopped Metformin XR 500 mg 3x a day with meals. We stopped Glipizide XL 5 mg in am.  Pt checks her sugars 2-3x a day and they are: - am: 73-117, 128 >> 95-140, 152 >> 69-107, 121 >> 78-139, 206 >> 98-156 >> 62x1, 77-128, 133 >> 79-99 - 2h after b'fast: 110-211, 264 >> 71-174 >> 60 (novolog)-180, 197 >> 165-227 >> 101-153, 180 >> 90-154 - before lunch: 59, 66-113 >> 64-102 >> 74-140 >> 54-118, 127 >> 117, 151, 250 >> 148-183 >> 99-152, 203 >> 80, 89 - 2h after lunch: 84-134 >> 107-228, 267 >> 70-112 >> 150-222 >> 161-312 >> 80, 81, 151-203, 224 >> 83-122 - before dinner:78-139, 163 >> 71-171 >> 57-104, 183 >> 95, 102 >> 88-159, 180 >> 70, 74, 201, 215 >> 81-90 - 2h after dinner: 109-167, 182 >> 133-266 >> 63-160, 189 >> 187-314 >> 120, 142-202 >> 83-171, 206, 314 (dessert) >> 82, 287 - bedtime: n/c >> 148 >> 185 >> 50-134 >> 71 >> 126-222 >> 166 >> n/c - nighttime: 56, 70-90 >> 79, 91 >> n/c >> 62-134, 172 >> n/c >> 48x1 >>  n/c Lowest sugar was 34 (6 units of NPH) >> 78 >> 48 >> 62x1 >> 79; she has hypoglycemia awareness at 60.  Highest sugar was 300s >> 314 (dessert) >> 287  Glucometer: One Touch Ultra  Pt's meals are: - Breakfast: tea with milk (nausea)  - Lunch: salad, soup, cheese - takes 70/30 before this - Dinner: egg, cheese, sometimes cereals - Snacks:   - no CKD, no BUN/creatinine: Lab Results  Component Value Date   BUN 17 05/23/2015   Lab Results  Component Value Date   CREATININE 0.52 05/23/2015   - no h/o HL Lab Results  Component Value Date   CHOL 137 05/23/2015   HDL 50.90 05/23/2015   LDLCALC 76 05/23/2015   TRIG 52.0 05/23/2015   CHOLHDL 3 05/23/2015   - last eye exam was on 03/2015. No DR.  - no numbness and tingling in her feet.  Last tsh: Lab Results  Component Value Date   TSH 1.11 05/23/2015   She is still b'feeding.  ROS: Constitutional: no weight gain/loss, + fatigue, no subjective hyperthermia, no nocturia  Eyes: no blurry vision, no xerophthalmia ENT:  no sore throat, no nodules palpated in throat, no dysphagia/odynophagia, no hoarseness Cardiovascular: no CP/SOB/palpitations/leg swelling Respiratory: no cough/SOB Gastrointestinal: no N/V/D/C Musculoskeletal: no muscle/joint aches Skin: no rashes Neurological: no tremors/numbness/tingling/dizziness  I reviewed pt's medications, allergies, PMH, social hx, family hx, and changes were documented in the history of present illness. Otherwise, unchanged from my initial visit note:  Past Medical History:  Diagnosis Date  . Diabetes mellitus without complication [facility])    Past Surgical History:  Procedure Laterality Date  . CESAREAN SECTION     History   Social History  . Marital Status: Married    Spouse Name: N/A    Number of Children: 1   Occupational History  . 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 Smoking status: Never Smoker   . Smokeless tobacco: Not  on file  . Alcohol Use: No  . Drug Use: No   Current Outpatient Prescriptions on File Prior to Visit  Medication Sig Dispense Refill  . ACCU-CHEK SOFTCLIX LANCETS lancets Use to test blood sugar 6 times daily as instructed. Dx code: I68.032 200 each 3  . Blood Glucose Monitoring Suppl (ACCU-CHEK AVIVA PLUS) W/DEVICE KIT Use to test blood sugar 6 times daily as instructed. Dx code: O24.912 1 kit 0  . glucose blood (ACCU-CHEK AVIVA PLUS) test strip Use to test blood sugar 6 times daily as instructed. Dx code: Z22.482 200 each 3  . Insulin Glargine (LANTUS SOLOSTAR) 100 UNIT/ML Solostar Pen Inject 5 Units into the skin daily before breakfast. 5 pen 1  . Insulin Pen Needle (CAREFINE PEN NEEDLES) 32G X 4 MM MISC Use 5x a day 200 each 3  . Insulin Syringe-Needle U-100 (INSULIN SYRINGE .5CC/31GX5/16") 31G X 5/16" 0.5 ML MISC 1 Syringe by Does not apply route 4 (four) times daily as needed. 100 each 3  . NOVOLOG FLEXPEN 100 UNIT/ML FlexPen INJECT 3-5 UNITS INTO THE SKIN 3 (THREE) TIMES DAILY WITH MEALS. 20 mL 2  . ibuprofen (ADVIL,MOTRIN) 600 MG tablet Take 1 tablet (600 mg total) by mouth every 6 (six) hours. (Patient not taking: Reported on 09/14/2015) 90 tablet 0  . Lidocaine 0.5 % GEL Apply 1 application topically as needed (pain). (Patient not taking: Reported on 09/14/2015) 1 Tube 0  . metFORMIN (GLUCOPHAGE-XR) 500 MG 24 hr tablet Take 1 tablet (500 mg total) by mouth 3 (three) times daily with meals. (Patient not taking: Reported on 09/14/2015) 60 tablet 1   No current facility-administered medications on file prior to visit.    Allergies  Allergen Reactions  . Penicillins Rash    Family History  Problem Relation Age of Onset  . Diabetes Father   . Kidney disease Father   . Diabetes Sister   . Diabetes Brother    PE: BP 98/60 (BP Location: Left Arm, Patient Position: Sitting)   Pulse 79   Ht 5' 5"  (1.651 m)   Wt 135 lb (61.2 kg)   SpO2 98%   BMI 22.47 kg/m  Wt Readings from Last 3  Encounters:  09/14/15 135 lb (61.2 kg)  05/23/15 135 lb 12.8 oz (61.6 kg)  02/09/15 132 lb 6 oz (60 kg)   Constitutional: normal weight, in NAD Eyes: PERRLA, EOMI, no exophthalmos ENT: moist mucous membranes, no thyromegaly, no cervical lymphadenopathy Cardiovascular: RRR, No MRG Respiratory: CTA B Gastrointestinal: abdomen soft, NT, ND, BS+ Musculoskeletal: no deformities, strength intact in all 4 Skin: moist, warm, no rashes Neurological: no tremor with  outstretched hands, DTR normal in all 4  ASSESSMENT: 1. DM1, uncontrolled, without long term complications except hypo-hyperglycemia  2. Late menstrual cycle  PLAN:  1. Patient with fairly well controlled DM1 with great sugar control after starting NPH instead of NovoLog (???).  - will continue current doses of insulin for now, but will advise her to let me know about the sugars in 2 weeks to see if we need to switch to Novolog. - In the future, she may need a Luxura pen or a Novopen Echo so she can inject half units. - For now, I suggested to:  Patient Instructions  Please continue: - Lantus 5 units at bedtime  Please decrease: - NPH to 5 units 2x a day  Please return in 3 months with your sugar log.   - continue checking sugars at different times of the day - check 2-3 times a day, rotating checks - we checked a HbA1c today >> 7.2% (stable) - Return to clinic in 3 mo with sugar log.  2. Late menstrual cycle - will repeat UPT  Philemon Kingdom, MD PhD [facility] Endocrinology

## 2015-09-14 NOTE — Patient Instructions (Addendum)
Patient Instructions  Please continue: - Lantus 5 units at bedtime  Please decrease: - NPH to 5 units 2x a day  Please let me know about your sugars in 2 weeks.  Please return in 3 months with your sugar log.

## 2015-09-15 LAB — PREGNANCY, URINE: Preg Test, Ur: POSITIVE — AB

## 2015-09-18 ENCOUNTER — Encounter: Payer: Self-pay | Admitting: Internal Medicine

## 2015-10-03 ENCOUNTER — Other Ambulatory Visit: Payer: Self-pay | Admitting: Obstetrics and Gynecology

## 2015-10-04 LAB — CYTOLOGY - PAP

## 2015-10-12 ENCOUNTER — Other Ambulatory Visit: Payer: Self-pay | Admitting: Internal Medicine

## 2015-10-12 ENCOUNTER — Encounter: Payer: Self-pay | Admitting: Internal Medicine

## 2015-10-12 DIAGNOSIS — O24913 Unspecified diabetes mellitus in pregnancy, third trimester: Secondary | ICD-10-CM

## 2015-10-15 ENCOUNTER — Other Ambulatory Visit: Payer: Self-pay

## 2015-10-15 MED ORDER — INSULIN NPH (HUMAN) (ISOPHANE) 100 UNIT/ML ~~LOC~~ SUSP: SUBCUTANEOUS | 2 refills | Status: DC

## 2015-11-02 LAB — OB RESULTS CONSOLE RPR: RPR: NONREACTIVE

## 2015-11-02 LAB — OB RESULTS CONSOLE ANTIBODY SCREEN: ANTIBODY SCREEN: NEGATIVE

## 2015-11-02 LAB — OB RESULTS CONSOLE ABO/RH: RH Type: POSITIVE

## 2015-11-02 LAB — OB RESULTS CONSOLE RUBELLA ANTIBODY, IGM: Rubella: IMMUNE

## 2015-11-02 LAB — OB RESULTS CONSOLE GC/CHLAMYDIA
CHLAMYDIA, DNA PROBE: NEGATIVE
GC PROBE AMP, GENITAL: NEGATIVE

## 2015-11-02 LAB — OB RESULTS CONSOLE HEPATITIS B SURFACE ANTIGEN: HEP B S AG: NEGATIVE

## 2015-11-02 LAB — OB RESULTS CONSOLE HIV ANTIBODY (ROUTINE TESTING): HIV: NONREACTIVE

## 2015-11-07 ENCOUNTER — Other Ambulatory Visit: Payer: Self-pay

## 2015-11-27 ENCOUNTER — Other Ambulatory Visit: Payer: Self-pay | Admitting: Obstetrics & Gynecology

## 2015-11-27 DIAGNOSIS — O24911 Unspecified diabetes mellitus in pregnancy, first trimester: Secondary | ICD-10-CM

## 2015-11-28 ENCOUNTER — Other Ambulatory Visit: Payer: Self-pay | Admitting: Internal Medicine

## 2015-12-13 ENCOUNTER — Telehealth: Payer: Self-pay | Admitting: Internal Medicine

## 2015-12-14 ENCOUNTER — Ambulatory Visit (INDEPENDENT_AMBULATORY_CARE_PROVIDER_SITE_OTHER): Payer: Medicaid Other | Admitting: Internal Medicine

## 2015-12-14 ENCOUNTER — Encounter: Payer: Self-pay | Admitting: Internal Medicine

## 2015-12-14 VITALS — BP 93/60 | HR 76 | Wt 145.0 lb

## 2015-12-14 DIAGNOSIS — E1065 Type 1 diabetes mellitus with hyperglycemia: Secondary | ICD-10-CM | POA: Diagnosis not present

## 2015-12-14 LAB — POCT GLYCOSYLATED HEMOGLOBIN (HGB A1C): HEMOGLOBIN A1C: 5.6

## 2015-12-14 MED ORDER — INSULIN GLARGINE 100 UNIT/ML SOLOSTAR PEN: 5.0000 [IU] | PEN_INJECTOR | Freq: Every day | SUBCUTANEOUS | 5 refills | Status: DC

## 2015-12-14 MED ORDER — INSULIN ASPART 100 UNIT/ML FLEXPEN: 3.0000 [IU] | PEN_INJECTOR | Freq: Three times a day (TID) | SUBCUTANEOUS | 5 refills | Status: DC

## 2015-12-14 NOTE — Progress Notes (Signed)
Patient ID: Theresa Koch, female   DOB: June 23, 1980, 35 y.o.   MRN: [id number]  HPI: Theresa Koch is a 35 y.o.-year-old female, initially referred by Dr. Synetta Shadow, returning for f/u for DM1, dx in 2011, but at DM1 in 02/2015, insulin-dependent since end 2013, uncontrolled, without complications. She is now pregnant in 16 weeks. Last visit 3 mo ago. BCBS now.  Last hemoglobin A1c was: Lab Results  Component Value Date   HGBA1C 7.2 09/14/2015   HGBA1C 7.1 05/23/2015   HGBA1C 8.1 (H) 02/09/2015   She is on an unusual insulin regimen: - Lantus 5 units at night  - NovoLog 2-4 units before meals >> ran out >> NPH 5 units 2x a day  We stopped Metformin XR 500 mg 3x a day with meals. We stopped Glipizide XL 5 mg in am.  Pt checks her sugars 2-3x a day and they are: - am:  69-107, 121 >> 78-139, 206 >> 98-156 >> 62x1, 77-128, 133 >> 79-99 >> 52-103, 117 - 2h after b'fast: 60 (novolog)-180, 197 >> 165-227 >> 101-153, 180 >> 90-154 >> 51-126, 140 - before lunch: 54-118, 127 >> 117, 151, 250 >> 148-183 >> 99-152, 203 >> 80, 89 >> 64-105, 121, 140 - 2h after lunch: 150-222 >> 161-312 >> 80, 81, 151-203, 224 >> 83-122 >> 145-261, 280 - before dinner: 95, 102 >> 88-159, 180 >> 70, 74, 201, 215 >> 81-90 >> 50, 57-121, 204 - 2h after dinner:\187-314 >> 120, 142-202 >> 83-171, 206, 314 (dessert) >> 82, 287 >> 76-194 - bedtime: n/c >> 148 >> 185 >> 50-134 >> 71 >> 126-222 >> 166 >> n/c >> 50-109 - nighttime: 56, 70-90 >> 79, 91 >> n/c >> 62-134, 172 >> n/c >> 48x1 >> n/c >> 50-127 Lowest sugar was 34 (6 units of NPH) >> 78 >> 48 >> 62x1 >> 79 >> 50; she has hypoglycemia awareness at 60.  Highest sugar was 300s >> 314 (dessert) >> 287 >> 280  Glucometer: One Touch Ultra  Pt's meals are: - Breakfast: tea with milk (nausea)  - Lunch: salad, soup, cheese - takes 70/30 before this - Dinner: egg, cheese, sometimes cereals  - no CKD, no BUN/creatinine: Lab Results  Component Value Date   BUN 17  05/23/2015   Lab Results  Component Value Date   CREATININE 0.52 05/23/2015   - no h/o HL: Lab Results  Component Value Date   CHOL 137 05/23/2015   HDL 50.90 05/23/2015   LDLCALC 76 05/23/2015   TRIG 52.0 05/23/2015   CHOLHDL 3 05/23/2015   - last eye exam was on 03/2015. No DR.  - no numbness and tingling in her feet.  Last tsh: Lab Results  Component Value Date   TSH 1.11 05/23/2015   ROS: Constitutional: no weight gain/loss, no fatigue, no subjective hyperthermia, no nocturia  Eyes: no blurry vision, no xerophthalmia ENT: no sore throat, no nodules palpated in throat, no dysphagia/odynophagia, no hoarseness Cardiovascular: no CP/SOB/palpitations/leg swelling Respiratory: no cough/SOB Gastrointestinal: no N/V/D/C Musculoskeletal: no muscle/joint aches Skin: no rashes Neurological: no tremors/numbness/tingling/dizziness  I reviewed pt's medications, allergies, PMH, social hx, family hx, and changes were documented in the history of present illness. Otherwise, unchanged from my initial visit note:  Past Medical History:  Diagnosis Date  . Diabetes mellitus without complication [facility])    Past Surgical History:  Procedure Laterality Date  . CESAREAN SECTION     History   Social History  . Marital Status: Married  Spouse Name: N/A    Number of Children: 1   Occupational History  . 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 Smoking status: Never Smoker   . Smokeless tobacco: Not on file  . Alcohol Use: No  . Drug Use: No   Current Outpatient Prescriptions on File Prior to Visit  Medication Sig Dispense Refill  . ACCU-CHEK SOFTCLIX LANCETS lancets Use to test blood sugar 6 times daily as instructed. Dx code: Z56.387 200 each 3  . B-D INS SYRINGE 0.5CC/31GX5/16 31G X 5/16" 0.5 ML MISC USE 4 TIMES A DAY AS NEEDED 100 each 2  . BD PEN NEEDLE NANO U/F 32G X 4 MM MISC USE 5 TIMES DAILY 200 each 3  . Blood Glucose Monitoring  Suppl (ACCU-CHEK AVIVA PLUS) W/DEVICE KIT Use to test blood sugar 6 times daily as instructed. Dx code: O24.912 1 kit 0  . glucose blood (ACCU-CHEK AVIVA PLUS) test strip Use to test blood sugar 6 times daily as instructed. Dx code: F64.332 200 each 3  . Insulin Glargine (LANTUS SOLOSTAR) 100 UNIT/ML Solostar Pen Inject 5 Units into the skin daily before breakfast. 5 pen 1  . insulin NPH Human (HUMULIN N) 100 UNIT/ML injection Inject 5 units two times daily into the skin. 10 mL 2  . Insulin Pen Needle (CAREFINE PEN NEEDLES) 32G X 4 MM MISC Use 5x a day 200 each 3  . Lidocaine 0.5 % GEL Apply 1 application topically as needed (pain). (Patient not taking: Reported on 09/14/2015) 1 Tube 0  . NOVOLOG FLEXPEN 100 UNIT/ML FlexPen INJECT 3-5 UNITS INTO THE SKIN 3 (THREE) TIMES DAILY WITH MEALS. 20 mL 2  . Prenatal Vit-Fe Fumarate-FA (PNV PRENATAL PLUS MULTIVITAMIN) 27-1 MG TABS TAKE 1 TABLET BY MOUTH DAILY. 30 tablet 8   No current facility-administered medications on file prior to visit.    Allergies  Allergen Reactions  . Penicillins Rash    Family History  Problem Relation Age of Onset  . Diabetes Father   . Kidney disease Father   . Diabetes Sister   . Diabetes Brother    PE: BP 93/60   Pulse 76   Wt 145 lb (65.8 kg)   BMI 24.13 kg/m  Wt Readings from Last 3 Encounters:  12/14/15 145 lb (65.8 kg)  09/14/15 135 lb (61.2 kg)  05/23/15 135 lb 12.8 oz (61.6 kg)   Constitutional: normal weight, in NAD Eyes: PERRLA, EOMI, no exophthalmos ENT: moist mucous membranes, no thyromegaly, no cervical lymphadenopathy Cardiovascular: RRR, No MRG Respiratory: CTA B Gastrointestinal: abdomen soft, NT, ND, BS+ Musculoskeletal: no deformities, strength intact in all 4 Skin: moist, warm, no rashes Neurological: no tremor with outstretched hands, DTR normal in all 4  ASSESSMENT: 1. DM1, uncontrolled, without long term complications, but with hypo-/hyperglycemia - now pregnant  We confirmed  DM1: Component     Latest Ref Rng 02/09/2015  C-Peptide     0.80 - 3.90 ng/mL 0.24 (L)  Glucose, Fasting     65 - 99 mg/dL 64 (L)  Glutamic Acid Decarb Ab     <5 IU/mL 42 (H)  Pancreatic Islet Cell Antibody     < 5 JDF Units <5    PLAN:  1. Patient with fairly well controlled DM1 with great improvement sugar control after starting NPH instead of NovoLog (?), however, now, she has high sugars after lunch and low sugars at night. We will need to stop Novolog and  start Novolog. I would like her to have the [employer] (able to give half insulin units) but not sure if covered by her insurance >>  she will check). - she tells me she is interested in an insulin pump. I will refer her to nutrition for a carb counting and will also need an appointment with diabetes educator. She will call her insurance to verify if they cover a pump and in this case, which brand. - For now, I suggested to:  Patient Instructions  Please continue: - Lantus 5 units at bedtime  Stop NPH.  Restart: - Novolog 2-3 units before the 3 main meals.  Please return in 1 month with your sugar log.   Please schedule an appt with Antonieta Iba with nutrition - carb counting tutorial.  Call your insurance to see if they cover an insulin pump and which brand. Call Leonia Reader to let her know.  - continue checking sugars at different times of the day - check 4 times a day, rotating checks - we checked a HbA1c today >> 5.6% (decreased) - Return to clinic in 1 mo with sugar log. I also encouraged her to send me her sugar logs through my chart, and she has been doing.  Philemon Kingdom, MD PhD [facility] Endocrinology

## 2015-12-14 NOTE — Patient Instructions (Addendum)
Please continue: - Lantus 5 units at bedtime  Stop NPH.  Restart: - Novolog 2-3 units before the 3 main meals.  Please return in 1 month with your sugar log.   Please schedule an appt with Oran ReinLaura Jobe with nutrition - carb counting tutorial.  Call your insurance to see if they cover an insulin pump and which brand. Call Cristy FolksLinda Spagnola to let her know.

## 2015-12-28 ENCOUNTER — Other Ambulatory Visit: Payer: Self-pay | Admitting: Obstetrics and Gynecology

## 2016-01-01 NOTE — Telephone Encounter (Signed)
Pt called asking about the NS fee for DOS 08/23/15, she stated her husband called and canceled this appt that morning, I informed her of the policy that same day cancellations do count as NS charge, she then changed her statement and said he called before that day. Sent a message to charge correction, this is the pts 1st ns, I requested for it to be removed

## 2016-01-22 ENCOUNTER — Encounter: Payer: Self-pay | Admitting: Internal Medicine

## 2016-01-22 ENCOUNTER — Ambulatory Visit (INDEPENDENT_AMBULATORY_CARE_PROVIDER_SITE_OTHER): Payer: Medicaid Other | Admitting: Internal Medicine

## 2016-01-22 VITALS — BP 102/60 | HR 74 | Wt 148.0 lb

## 2016-01-22 DIAGNOSIS — E1065 Type 1 diabetes mellitus with hyperglycemia: Secondary | ICD-10-CM

## 2016-01-22 NOTE — Patient Instructions (Addendum)
Please continue: - Lantus 5 units at bedtime - Novolog 3-4 units before the 3 main meals.  Please return in 1.5 month with your sugar log.

## 2016-01-22 NOTE — Progress Notes (Signed)
Patient ID: Theresa Koch, female   DOB: 03/29/1980, 36 y.o.   MRN: [id number]  HPI: Adra Shepler is a 36 y.o.-year-old female, initially referred by Dr. Synetta Shadow, returning for f/u for DM1, dx in 2011, but at DM1 in 02/2015, insulin-dependent since end 2013, uncontrolled, without complications. She is now pregnant: 22 weeks. Last visit 1.5 mo ago. BCBS now.  Last hemoglobin A1c was: Lab Results  Component Value Date   HGBA1C 5.6 12/14/2015   HGBA1C 7.2 09/14/2015   HGBA1C 7.1 05/23/2015   She is on an unusual insulin regimen: - Lantus 5 units at night  - NovoLog 3-4 units before meals We stopped Metformin XR 500 mg 3x a day with meals. We stopped Glipizide XL 5 mg in am.  Pt checks her sugars 2-3x a day and they are: - am:  69-107, 121 >> 78-139, 206 >> 98-156 >> 62x1, 77-128, 133 >> 79-99 >> 52-103, 117 >> 61-91, 98 - 2h after b'fast: 165-227 >> 101-153, 180 >> 90-154 >> 51-126, 140 >> 76, 97-119, 135, 213 (forgot insulin) - before lunch: 54-118, 127 >> 117, 151, 250 >> 148-183 >> 99-152, 203 >> 80, 89 >> 64-105, 121, 140 >> 67-89 - 2h after lunch: 150-222 >> 161-312 >> 80, 81, 151-203, 224 >> 83-122 >> 145-261, 280 >> 106-139, 183 x1 - before dinner: 95, 102 >> 88-159, 180 >> 70, 74, 201, 215 >> 81-90 >> 50, 57-121, 204 >> 68, 71 - 2h after dinner:\187-314 >> 120, 142-202 >> 83-171, 206, 314 (dessert) >> 82, 287 >> 76-194 >> 68-129, 160 - bedtime: n/c >> 148 >> 185 >> 50-134 >> 71 >> 126-222 >> 166 >> n/c >> 50-109 - nighttime: 56, 70-90 >> 79, 91 >> n/c >> 62-134, 172 >> n/c >> 48x1 >> n/c >> 50-127 Lowest sugar was 34 (6 units of NPH) >> 78 >> 48 >> 62x1 >> 79 >> 50 >> 61; she has hypoglycemia awareness at 60.  Highest sugar was 300s >> 314 (dessert) >> 287 >> 280 >> 213  Glucometer: One Touch Ultra  Pt's meals are: - Breakfast: tea with milk + cookies - Lunch: salad, soup, cheese  - Dinner: egg, cheese, meat, sometimes cereals  - no CKD, no BUN/creatinine: Lab Results   Component Value Date   BUN 17 05/23/2015   Lab Results  Component Value Date   CREATININE 0.52 05/23/2015   - no h/o HL: Lab Results  Component Value Date   CHOL 137 05/23/2015   HDL 50.90 05/23/2015   LDLCALC 76 05/23/2015   TRIG 52.0 05/23/2015   CHOLHDL 3 05/23/2015   - last eye exam was on 03/2015. No DR.  - no numbness and tingling in her feet.  Last tsh: Lab Results  Component Value Date   TSH 1.11 05/23/2015   ROS: Constitutional: no weight gain/loss, no fatigue, no subjective hyperthermia, no nocturia  Eyes: no blurry vision, no xerophthalmia ENT: no sore throat, no nodules palpated in throat, no dysphagia/odynophagia, no hoarseness Cardiovascular: no CP/SOB/palpitations/leg swelling Respiratory: no cough/SOB Gastrointestinal: no N/V/D/C Musculoskeletal: no muscle/joint aches Skin: no rashes Neurological: no tremors/numbness/tingling/dizziness  I reviewed pt's medications, allergies, PMH, social hx, family hx, and changes were documented in the history of present illness. Otherwise, unchanged from my initial visit note:  Past Medical History:  Diagnosis Date  . Diabetes mellitus without complication [facility])    Past Surgical History:  Procedure Laterality Date  . CESAREAN SECTION     History   Social History  .  Marital Status: Married    Spouse Name: N/A    Number of Children: 1   Occupational History  . 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 Smoking status: Never Smoker   . Smokeless tobacco: Not on file  . Alcohol Use: No  . Drug Use: No   Current Outpatient Prescriptions on File Prior to Visit  Medication Sig Dispense Refill  . ACCU-CHEK SOFTCLIX LANCETS lancets Use to test blood sugar 6 times daily as instructed. Dx code: L93.790 200 each 3  . B-D INS SYRINGE 0.5CC/31GX5/16 31G X 5/16" 0.5 ML MISC USE 4 TIMES A DAY AS NEEDED 100 each 2  . BD PEN NEEDLE NANO U/F 32G X 4 MM MISC USE 5 TIMES DAILY 200  each 3  . Blood Glucose Monitoring Suppl (ACCU-CHEK AVIVA PLUS) W/DEVICE KIT Use to test blood sugar 6 times daily as instructed. Dx code: O24.912 1 kit 0  . glucose blood (ACCU-CHEK AVIVA PLUS) test strip Use to test blood sugar 6 times daily as instructed. Dx code: W40.973 200 each 3  . insulin aspart (NOVOLOG FLEXPEN) 100 UNIT/ML FlexPen Inject 3-5 Units into the skin 3 (three) times daily with meals. 15 mL 5  . Insulin Glargine (LANTUS SOLOSTAR) 100 UNIT/ML Solostar Pen Inject 5 Units into the skin at bedtime. 5 pen 5  . Insulin Pen Needle (CAREFINE PEN NEEDLES) 32G X 4 MM MISC Use 5x a day 200 each 3  . Prenatal Vit-Fe Fumarate-FA (PNV PRENATAL PLUS MULTIVITAMIN) 27-1 MG TABS TAKE 1 TABLET BY MOUTH DAILY. 30 tablet 8   No current facility-administered medications on file prior to visit.    Allergies  Allergen Reactions  . Penicillins Rash    Family History  Problem Relation Age of Onset  . Diabetes Father   . Kidney disease Father   . Diabetes Sister   . Diabetes Brother    PE: BP 102/60 (BP Location: Left Arm, Patient Position: Sitting)   Pulse 74   Wt 148 lb (67.1 kg)   SpO2 98%   BMI 24.63 kg/m  Wt Readings from Last 3 Encounters:  01/22/16 148 lb (67.1 kg)  12/14/15 145 lb (65.8 kg)  09/14/15 135 lb (61.2 kg)   Constitutional: normal weight, in NAD Eyes: PERRLA, EOMI, no exophthalmos ENT: moist mucous membranes, no thyromegaly, no cervical lymphadenopathy Cardiovascular: RRR, No MRG Respiratory: CTA B Gastrointestinal: abdomen soft, NT, ND, BS+ Musculoskeletal: no deformities, strength intact in all 4 Skin: moist, warm, no rashes Neurological: no tremor with outstretched hands, DTR normal in all 4  ASSESSMENT: 1. DM1, uncontrolled, without long term complications, but with hypo-/hyperglycemia - now pregnant  We confirmed DM1: Component     Latest Ref Rng 02/09/2015  C-Peptide     0.80 - 3.90 ng/mL 0.24 (L)  Glucose, Fasting     65 - 99 mg/dL 64 (L)   Glutamic Acid Decarb Ab     <5 IU/mL 42 (H)  Pancreatic Islet Cell Antibody     < 5 JDF Units <5    PLAN:  1. Patient with fairly well controlled DM1 with great improvement sugar control after starting NPH instead of NovoLog (?), however, at last visit we stopped NPH and switched to Novolog as she was pregnant and had high sugars after lunch and low sugars at night. Sugars now are almost all at goal! - I would like her to have the [employer] (able to give half  insulin units) but she did not check with her insurance if they cover it. For now, she seems to be doing great on the regular Novolog pen.. - she is interested in an insulin pump and started to make inquiries with her Case Worker to see if covered. She did not get an answer yet. I referred her to nutrition for carb counting (appt scheduled) and will also need an appointment with diabetes educator (if pump covered).  - reviewed last HbA1c >> perfect, at 5.6%, but she was having sugars in the 50s then. Now, lowest was 61. - For now, I suggested to:  Patient Instructions  Please continue: - Lantus 5 units at bedtime - Novolog 3-4 units before the 3 main meals.  Please return in 1.5 month with your sugar log.   - continue checking sugars at different times of the day - check 4 times a day, rotating checks - Return to clinic in 1.5 mo with sugar log.   Philemon Kingdom, MD PhD [facility] Endocrinology

## 2016-01-29 ENCOUNTER — Ambulatory Visit: Payer: Medicaid Other | Admitting: *Deleted

## 2016-03-10 ENCOUNTER — Ambulatory Visit (INDEPENDENT_AMBULATORY_CARE_PROVIDER_SITE_OTHER): Payer: Medicaid Other | Admitting: Internal Medicine

## 2016-03-10 ENCOUNTER — Encounter: Payer: Self-pay | Admitting: Internal Medicine

## 2016-03-10 VITALS — BP 102/60 | HR 100 | Ht 65.5 in | Wt 156.0 lb

## 2016-03-10 DIAGNOSIS — E1065 Type 1 diabetes mellitus with hyperglycemia: Secondary | ICD-10-CM | POA: Diagnosis not present

## 2016-03-10 LAB — POCT GLYCOSYLATED HEMOGLOBIN (HGB A1C): HEMOGLOBIN A1C: 6

## 2016-03-10 MED ORDER — INSULIN ASPART 100 UNIT/ML FLEXPEN: 5.0000 [IU] | PEN_INJECTOR | Freq: Three times a day (TID) | SUBCUTANEOUS | 5 refills | Status: DC

## 2016-03-10 MED ORDER — INSULIN GLARGINE 100 UNIT/ML SOLOSTAR PEN: 6.0000 [IU] | PEN_INJECTOR | Freq: Every day | SUBCUTANEOUS | 11 refills | Status: DC

## 2016-03-10 NOTE — Addendum Note (Signed)
Addended by: Darene LamerHOMPSON, Nickey Canedo T on: 03/10/2016 11:28 AM   Modules accepted: Orders

## 2016-03-10 NOTE — Progress Notes (Signed)
Patient ID: [name], female   DOB: 01/30/1980, 36 y.o.   MRN: [id number]  HPI: Vani Gunner is a 36 y.o.-year-old female, initially referred by Dr. Synetta Shadow, returning for f/u for DM1, dx in 2011, but at DM1 in 02/2015, insulin-dependent since end 2013, uncontrolled, without complications. She is now pregnant: 30 weeks (Due Date 05/06/2016: girl). Last visit 1.5 mo ago. BCBS now.  She started to have high CBG after last visit >> changed to a high dose of NPH by ObGyn >> pt having 40-50s >> dose reduced by 50% >> pt still having 40-50s and having HAs. She does not sleep well at night b/c low CBGs.  Last hemoglobin A1c was: Lab Results  Component Value Date   HGBA1C 5.6 12/14/2015   HGBA1C 7.2 09/14/2015   HGBA1C 7.1 05/23/2015   She is now on: - NPH 10 units in am and 4 units in pm - NovoLog 8 units in am and 6 units before dinner   She was on: - Lantus 6 units at night  - NovoLog 9 units before B and 5 units before L and D  We stopped Metformin XR 500 mg 3x a day with meals. We stopped Glipizide XL 5 mg in am.  Pt checks her sugars 3-5x a day and they are: - am:   98-156 >> 62x1, 77-128, 133 >> 79-99 >> 52-103, 117 >> 61-91, 98 >> 46-82, 110 - 2h after b'fast:  90-154 >> 51-126, 140 >> 76, 97-119, 135, 213 (forgot insulin) >> 53-110, 146 - before lunch: 148-183 >> 99-152, 203 >> 80, 89 >> 64-105, 121, 140 >> 67-89 >> 52-68 - 2h after lunch: 80, 81, 151-203, 224 >> 83-122 >> 145-261, 280 >> 106-139, 183 x1 >> 40 x1, 83-151 - before dinner: 70, 74, 201, 215 >> 81-90 >> 50, 57-121, 204 >> 68, 71 >> 69-117, 164 (corr the 40) - 2h after dinner:83-171, 206, 314 (dessert) >> 82, 287 >> 76-194 >> 68-129, 160 >> 95-133 - bedtime: n/c >> 148 >> 185 >> 50-134 >> 71 >> 126-222 >> 166 >> n/c >> 50-109 >> 58 - nighttime: 56, 70-90 >> 79, 91 >> n/c >> 62-134, 172 >> n/c >> 48x1 >> n/c >> 50-127 >> 41, 42 Lowest sugar was 34 (6 units of NPH) >> 78 >> 48 >> 62x1 >> 79 >> 50 >> 61 >> 40;  she has hypoglycemia awareness at 60.  Highest sugar was 300s >> 314 (dessert) >> 287 >> 280 >> 213 >> 164.  Glucometer: One Touch Ultra  Pt's meals are: - Breakfast: tea with milk + cookies - Lunch: salad, soup, cheese  - Dinner: egg, cheese, meat, sometimes cereals  - no CKD, no BUN/creatinine: Lab Results  Component Value Date   BUN 17 05/23/2015   Lab Results  Component Value Date   CREATININE 0.52 05/23/2015   - no h/o HL: Lab Results  Component Value Date   CHOL 137 05/23/2015   HDL 50.90 05/23/2015   LDLCALC 76 05/23/2015   TRIG 52.0 05/23/2015   CHOLHDL 3 05/23/2015   - last eye exam was on 03/2015. No DR.  - no numbness and tingling in her feet.  Last tsh: Lab Results  Component Value Date   TSH 1.11 05/23/2015   ROS: Constitutional: no weight gain/loss, no fatigue, no subjective hyperthermia, no nocturia  Eyes: no blurry vision, no xerophthalmia ENT: no sore throat, no nodules palpated in throat, no dysphagia/odynophagia, no hoarseness Cardiovascular: no CP/SOB/palpitations/leg swelling Respiratory: no  cough/SOB Gastrointestinal: no N/V/D/C Musculoskeletal: no muscle/joint aches Skin: no rashes Neurological: no tremors/numbness/tingling/dizziness, + HA  I reviewed pt's medications, allergies, PMH, social hx, family hx, and changes were documented in the history of present illness. Otherwise, unchanged from my initial visit note:  Past Medical History:  Diagnosis Date  . Diabetes mellitus without complication [facility])    Past Surgical History:  Procedure Laterality Date  . CESAREAN SECTION     History   Social History  . Marital Status: Married    Spouse Name: N/A    Number of Children: 1   Occupational History  . 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 Smoking status: Never Smoker   . Smokeless tobacco: Not on file  . Alcohol Use: No  . Drug Use: No   Current Outpatient Prescriptions on File Prior  to Visit  Medication Sig Dispense Refill  . ACCU-CHEK SOFTCLIX LANCETS lancets Use to test blood sugar 6 times daily as instructed. Dx code: G66.599 200 each 3  . B-D INS SYRINGE 0.5CC/31GX5/16 31G X 5/16" 0.5 ML MISC USE 4 TIMES A DAY AS NEEDED 100 each 2  . BD PEN NEEDLE NANO U/F 32G X 4 MM MISC USE 5 TIMES DAILY 200 each 3  . Blood Glucose Monitoring Suppl (ACCU-CHEK AVIVA PLUS) W/DEVICE KIT Use to test blood sugar 6 times daily as instructed. Dx code: O24.912 1 kit 0  . glucose blood (ACCU-CHEK AVIVA PLUS) test strip Use to test blood sugar 6 times daily as instructed. Dx code: J57.017 200 each 3  . insulin aspart (NOVOLOG FLEXPEN) 100 UNIT/ML FlexPen Inject 3-5 Units into the skin 3 (three) times daily with meals. 15 mL 5  . Insulin Pen Needle (CAREFINE PEN NEEDLES) 32G X 4 MM MISC Use 5x a day 200 each 3  . Prenatal Vit-Fe Fumarate-FA (PNV PRENATAL PLUS MULTIVITAMIN) 27-1 MG TABS TAKE 1 TABLET BY MOUTH DAILY. 30 tablet 8   No current facility-administered medications on file prior to visit.    Allergies  Allergen Reactions  . Penicillins Rash    Family History  Problem Relation Age of Onset  . Diabetes Father   . Kidney disease Father   . Diabetes Sister   . Diabetes Brother    PE: BP 102/60 (BP Location: Left Arm, Patient Position: Sitting)   Pulse 100   Ht 5' 5.5" (1.664 m)   Wt 156 lb (70.8 kg)   SpO2 99%   BMI 25.56 kg/m  Wt Readings from Last 3 Encounters:  03/10/16 156 lb (70.8 kg)  01/22/16 148 lb (67.1 kg)  12/14/15 145 lb (65.8 kg)   Constitutional: normal weight, in NAD Eyes: PERRLA, EOMI, no exophthalmos ENT: moist mucous membranes, no thyromegaly, no cervical lymphadenopathy Cardiovascular: RRR, No MRG Respiratory: CTA B Gastrointestinal: abdomen soft, NT, ND, BS+ Musculoskeletal: no deformities, strength intact in all 4 Skin: moist, warm, no rashes Neurological: no tremor with outstretched hands, DTR normal in all 4  ASSESSMENT: 1. DM1, uncontrolled,  without long term complications, but with hypo-/hyperglycemia - now pregnant  We confirmed DM1: Component     Latest Ref Rng 02/09/2015  C-Peptide     0.80 - 3.90 ng/mL 0.24 (L)  Glucose, Fasting     65 - 99 mg/dL 64 (L)  Glutamic Acid Decarb Ab     <5 IU/mL 42 (H)  Pancreatic Islet Cell Antibody     < 5 JDF Units <5  PLAN:  1. Patient with fairly well controlled DM1 with worse sugar control after changing from Lantus to NPH by her ObGyn Drs. Sugars are very low and she feels very poorly: daily HAs, cannot sleep at night. She would like to go back to lantus. She was on Lantus during prev. Pregnancy >> excellent outcome. I agree with her >> will switch to Lantus. - today HbA1c >> 6.0% (great!) - For now, I suggested to:  Patient Instructions  Please switch from NPH to Lantus: - Lantus 6 units at bedtime  Continue Novolog: - 8-9 units before B - 5 units before L and D  Please return in 1.5 months with your sugar log.   - continue checking sugars at different times of the day - check 4 times a day, rotating checks - Return to clinic in 1.5 mo with sugar log.   Philemon Kingdom, MD PhD [facility] Endocrinology

## 2016-03-10 NOTE — Patient Instructions (Addendum)
Please switch from NPH to Lantus: - Lantus 6 units at bedtime  Continue Novolog: - 8-9 units before B - 5 units before L and D  Please return in 1.5 months with your sugar log.

## 2016-03-18 ENCOUNTER — Other Ambulatory Visit: Payer: Self-pay | Admitting: Obstetrics and Gynecology

## 2016-03-20 ENCOUNTER — Encounter (HOSPITAL_COMMUNITY): Payer: Self-pay | Admitting: *Deleted

## 2016-03-20 ENCOUNTER — Inpatient Hospital Stay (HOSPITAL_COMMUNITY)
Admission: AD | Admit: 2016-03-20 | Discharge: 2016-03-20 | Disposition: A | Discharge status: Home / Self Care | Payer: Medicaid Other | Source: Ambulatory Visit | Attending: Obstetrics and Gynecology | Admitting: Obstetrics and Gynecology

## 2016-03-20 DIAGNOSIS — O24913 Unspecified diabetes mellitus in pregnancy, third trimester: Secondary | ICD-10-CM | POA: Diagnosis present

## 2016-03-20 DIAGNOSIS — Z3A00 Weeks of gestation of pregnancy not specified: Secondary | ICD-10-CM | POA: Insufficient documentation

## 2016-03-20 DIAGNOSIS — Z0189 Encounter for other specified special examinations: Secondary | ICD-10-CM | POA: Diagnosis present

## 2016-03-20 DIAGNOSIS — O24113 Pre-existing diabetes mellitus, type 2, in pregnancy, third trimester: Secondary | ICD-10-CM

## 2016-03-20 NOTE — MAU Note (Signed)
t stated she was sent from the office for an EKG for her diabetes.  Denies and chest pain or discomfort.

## 2016-03-20 NOTE — Progress Notes (Signed)
Pt sent by office for EKG d/t h/o Diabetes EKG NSR

## 2016-03-20 NOTE — MAU Note (Signed)
Talked with Dr. Claiborne Billingsallahan. Pt just needs EKG as an out patient and then she can leave.

## 2016-04-07 ENCOUNTER — Other Ambulatory Visit: Payer: Self-pay | Admitting: Internal Medicine

## 2016-04-07 ENCOUNTER — Telehealth: Payer: Self-pay | Admitting: Internal Medicine

## 2016-04-07 ENCOUNTER — Other Ambulatory Visit: Payer: Self-pay

## 2016-04-07 DIAGNOSIS — E1065 Type 1 diabetes mellitus with hyperglycemia: Secondary | ICD-10-CM

## 2016-04-07 MED ORDER — INSULIN PEN NEEDLE 32G X 4 MM MISC: 3 refills | Status: DC

## 2016-04-07 NOTE — Telephone Encounter (Signed)
Submitted already 

## 2016-04-07 NOTE — Telephone Encounter (Signed)
Pt called in and needs her Accu Check [employer] test strips, lancets, and pen needles sent into the [facility] on [address].

## 2016-04-11 ENCOUNTER — Other Ambulatory Visit: Payer: Self-pay | Admitting: Obstetrics

## 2016-04-11 LAB — OB RESULTS CONSOLE GBS: STREP GROUP B AG: NEGATIVE

## 2016-05-06 ENCOUNTER — Encounter (HOSPITAL_COMMUNITY): Payer: Self-pay | Admitting: *Deleted

## 2016-05-06 ENCOUNTER — Telehealth (HOSPITAL_COMMUNITY): Payer: Self-pay | Admitting: *Deleted

## 2016-05-06 NOTE — Telephone Encounter (Signed)
Preadmission screen  

## 2016-05-07 ENCOUNTER — Ambulatory Visit: Payer: Medicaid Other | Admitting: Internal Medicine

## 2016-05-08 ENCOUNTER — Other Ambulatory Visit: Payer: Self-pay | Admitting: Obstetrics and Gynecology

## 2016-05-09 ENCOUNTER — Inpatient Hospital Stay (HOSPITAL_COMMUNITY): Payer: Medicaid Other | Admitting: Certified Registered Nurse Anesthetist

## 2016-05-09 ENCOUNTER — Encounter (HOSPITAL_COMMUNITY)
Admission: RE | Disposition: A | Discharge status: Home / Self Care | Payer: Self-pay | Source: Ambulatory Visit | Attending: Obstetrics and Gynecology

## 2016-05-09 ENCOUNTER — Inpatient Hospital Stay (HOSPITAL_COMMUNITY)
Admission: RE | Admit: 2016-05-09 | Discharge: 2016-05-11 | DRG: 766 | Disposition: A | Discharge status: Home / Self Care | Payer: Medicaid Other | Source: Ambulatory Visit | Attending: Obstetrics and Gynecology | Admitting: Obstetrics and Gynecology

## 2016-05-09 ENCOUNTER — Encounter (HOSPITAL_COMMUNITY): Payer: Self-pay

## 2016-05-09 VITALS — BP 105/55 | HR 67 | Temp 98.4°F | Resp 16 | Ht 65.0 in | Wt 175.0 lb

## 2016-05-09 DIAGNOSIS — Z9889 Other specified postprocedural states: Secondary | ICD-10-CM

## 2016-05-09 DIAGNOSIS — O2402 Pre-existing diabetes mellitus, type 1, in childbirth: Secondary | ICD-10-CM | POA: Diagnosis present

## 2016-05-09 DIAGNOSIS — O34211 Maternal care for low transverse scar from previous cesarean delivery: Secondary | ICD-10-CM | POA: Diagnosis present

## 2016-05-09 DIAGNOSIS — O321XX0 Maternal care for breech presentation, not applicable or unspecified: Secondary | ICD-10-CM | POA: Diagnosis present

## 2016-05-09 DIAGNOSIS — Z3A39 39 weeks gestation of pregnancy: Secondary | ICD-10-CM

## 2016-05-09 DIAGNOSIS — Z794 Long term (current) use of insulin: Secondary | ICD-10-CM | POA: Diagnosis not present

## 2016-05-09 DIAGNOSIS — E109 Type 1 diabetes mellitus without complications: Secondary | ICD-10-CM | POA: Diagnosis present

## 2016-05-09 DIAGNOSIS — O09529 Supervision of elderly multigravida, unspecified trimester: Secondary | ICD-10-CM

## 2016-05-09 LAB — CBC
HCT: 28 % — ABNORMAL LOW (ref 36.0–46.0)
HEMOGLOBIN: 9.1 g/dL — AB (ref 12.0–15.0)
MCH: 25.9 pg — ABNORMAL LOW (ref 26.0–34.0)
MCHC: 32.5 g/dL (ref 30.0–36.0)
MCV: 79.5 fL (ref 78.0–100.0)
PLATELETS: 147 10*3/uL — AB (ref 150–400)
RBC: 3.52 MIL/uL — AB (ref 3.87–5.11)
RDW: 14.8 % (ref 11.5–15.5)
WBC: 5.7 10*3/uL (ref 4.0–10.5)

## 2016-05-09 LAB — GLUCOSE, CAPILLARY
GLUCOSE-CAPILLARY: 117 mg/dL — AB (ref 65–99)
GLUCOSE-CAPILLARY: 31 mg/dL — AB (ref 65–99)
GLUCOSE-CAPILLARY: 43 mg/dL — AB (ref 65–99)
GLUCOSE-CAPILLARY: 69 mg/dL (ref 65–99)
GLUCOSE-CAPILLARY: 70 mg/dL (ref 65–99)
GLUCOSE-CAPILLARY: 59 mg/dL — AB (ref 65–99)
GLUCOSE-CAPILLARY: 71 mg/dL (ref 65–99)
Glucose-Capillary: 111 mg/dL — ABNORMAL HIGH (ref 65–99)
Glucose-Capillary: 71 mg/dL (ref 65–99)
Glucose-Capillary: 55 mg/dL — ABNORMAL LOW (ref 65–99)

## 2016-05-09 LAB — TYPE AND SCREEN
ABO/RH(D): O POS
ANTIBODY SCREEN: NEGATIVE

## 2016-05-09 SURGERY — Surgical Case
Anesthesia: Regional

## 2016-05-09 MED ORDER — PHENYLEPHRINE 8 MG IN D5W 100 ML (0.08MG/ML) PREMIX OPTIME
INJECTION | INTRAVENOUS | Status: DC | PRN
  Administered 2016-05-09: 60 ug/min via INTRAVENOUS

## 2016-05-09 MED ORDER — ONDANSETRON HCL 4 MG/2ML IJ SOLN
INTRAMUSCULAR | Status: DC | PRN
  Administered 2016-05-09: 4 mg via INTRAVENOUS

## 2016-05-09 MED ORDER — MEASLES, MUMPS & RUBELLA VAC ~~LOC~~ INJ
0.5000 mL | INJECTION | Freq: Once | SUBCUTANEOUS | Status: DC
  Filled 2016-05-09: qty 0.5

## 2016-05-09 MED ORDER — SIMETHICONE 80 MG PO CHEW
80.0000 mg | CHEWABLE_TABLET | ORAL | Status: DC | PRN
  Filled 2016-05-09: qty 1

## 2016-05-09 MED ORDER — FENTANYL CITRATE (PF) 100 MCG/2ML IJ SOLN
INTRAMUSCULAR | Status: DC | PRN
  Administered 2016-05-09: 10 ug via INTRATHECAL

## 2016-05-09 MED ORDER — OXYCODONE HCL 5 MG PO TABS
5.0000 mg | ORAL_TABLET | ORAL | Status: DC | PRN
  Administered 2016-05-10 – 2016-05-11 (×3): 5 mg via ORAL
  Filled 2016-05-09 (×3): qty 1

## 2016-05-09 MED ORDER — KETOROLAC TROMETHAMINE 30 MG/ML IJ SOLN: 30.0000 mg | Freq: Four times a day (QID) | INTRAMUSCULAR | Status: AC | PRN

## 2016-05-09 MED ORDER — ONDANSETRON HCL 4 MG/2ML IJ SOLN: 4.0000 mg | Freq: Four times a day (QID) | INTRAMUSCULAR | Status: DC | PRN

## 2016-05-09 MED ORDER — OXYTOCIN 40 UNITS IN LACTATED RINGERS INFUSION - SIMPLE MED: 2.5000 [IU]/h | INTRAVENOUS | Status: DC

## 2016-05-09 MED ORDER — MIDAZOLAM HCL 2 MG/2ML IJ SOLN: 0.5000 mg | Freq: Once | INTRAMUSCULAR | Status: DC | PRN

## 2016-05-09 MED ORDER — FLEET ENEMA 7-19 GM/118ML RE ENEM: 1.0000 | ENEMA | Freq: Every day | RECTAL | Status: DC | PRN

## 2016-05-09 MED ORDER — MORPHINE SULFATE (PF) 0.5 MG/ML IJ SOLN
INTRAMUSCULAR | Status: AC
  Filled 2016-05-09: qty 10

## 2016-05-09 MED ORDER — PHENYLEPHRINE 8 MG IN D5W 100 ML (0.08MG/ML) PREMIX OPTIME
INJECTION | INTRAVENOUS | Status: AC
  Filled 2016-05-09: qty 300

## 2016-05-09 MED ORDER — ZOLPIDEM TARTRATE 5 MG PO TABS: 5.0000 mg | ORAL_TABLET | Freq: Every evening | ORAL | Status: DC | PRN

## 2016-05-09 MED ORDER — OXYCODONE HCL 5 MG PO TABS: 10.0000 mg | ORAL_TABLET | ORAL | Status: DC | PRN

## 2016-05-09 MED ORDER — DEXTROSE 50 % IV SOLN
INTRAVENOUS | Status: DC | PRN
  Administered 2016-05-09: 12.5 g via INTRAVENOUS

## 2016-05-09 MED ORDER — FENTANYL CITRATE (PF) 100 MCG/2ML IJ SOLN
INTRAMUSCULAR | Status: AC
  Filled 2016-05-09: qty 2

## 2016-05-09 MED ORDER — LACTATED RINGERS IV SOLN
INTRAVENOUS | Status: DC
  Administered 2016-05-10: 07:00:00 via INTRAVENOUS

## 2016-05-09 MED ORDER — ACETAMINOPHEN 325 MG PO TABS: 650.0000 mg | ORAL_TABLET | ORAL | Status: DC | PRN

## 2016-05-09 MED ORDER — WITCH HAZEL-GLYCERIN EX PADS: 1.0000 "application " | MEDICATED_PAD | CUTANEOUS | Status: DC | PRN

## 2016-05-09 MED ORDER — LACTATED RINGERS IV SOLN
INTRAVENOUS | Status: DC | PRN
  Administered 2016-05-09: 19:00:00 via INTRAVENOUS

## 2016-05-09 MED ORDER — COCONUT OIL OIL: 1.0000 "application " | TOPICAL_OIL | Status: DC | PRN

## 2016-05-09 MED ORDER — SOD CITRATE-CITRIC ACID 500-334 MG/5ML PO SOLN
30.0000 mL | ORAL | Status: DC | PRN
  Administered 2016-05-09: 30 mL via ORAL
  Filled 2016-05-09: qty 15

## 2016-05-09 MED ORDER — KETOROLAC TROMETHAMINE 30 MG/ML IJ SOLN
30.0000 mg | Freq: Four times a day (QID) | INTRAMUSCULAR | Status: AC | PRN
  Administered 2016-05-09: 30 mg via INTRAMUSCULAR

## 2016-05-09 MED ORDER — SCOPOLAMINE 1 MG/3DAYS TD PT72
MEDICATED_PATCH | TRANSDERMAL | Status: DC | PRN
  Administered 2016-05-09: 1 via TRANSDERMAL

## 2016-05-09 MED ORDER — OXYTOCIN 40 UNITS IN LACTATED RINGERS INFUSION - SIMPLE MED
1.0000 m[IU]/min | INTRAVENOUS | Status: DC
  Administered 2016-05-09: 1 m[IU]/min via INTRAVENOUS

## 2016-05-09 MED ORDER — TETANUS-DIPHTH-ACELL PERTUSSIS 5-2.5-18.5 LF-MCG/0.5 IM SUSP: 0.5000 mL | Freq: Once | INTRAMUSCULAR | Status: DC

## 2016-05-09 MED ORDER — BUPIVACAINE HCL (PF) 0.5 % IJ SOLN
INTRAMUSCULAR | Status: AC
  Filled 2016-05-09: qty 30

## 2016-05-09 MED ORDER — FERROUS SULFATE 325 (65 FE) MG PO TABS
325.0000 mg | ORAL_TABLET | Freq: Two times a day (BID) | ORAL | Status: DC
  Administered 2016-05-10 – 2016-05-11 (×3): 325 mg via ORAL
  Filled 2016-05-09 (×3): qty 1

## 2016-05-09 MED ORDER — MEPERIDINE HCL 25 MG/ML IJ SOLN: 6.2500 mg | INTRAMUSCULAR | Status: DC | PRN

## 2016-05-09 MED ORDER — TERBUTALINE SULFATE 1 MG/ML IJ SOLN: 0.2500 mg | Freq: Once | INTRAMUSCULAR | Status: DC | PRN

## 2016-05-09 MED ORDER — DEXTROSE 5 % IV SOLN
Freq: Once | INTRAVENOUS | Status: AC
  Administered 2016-05-09: 100 mL via INTRAVENOUS
  Filled 2016-05-09: qty 10

## 2016-05-09 MED ORDER — DEXTROSE 5 % IV SOLN
INTRAVENOUS | Status: DC
  Administered 2016-05-09: 21:00:00 via INTRAVENOUS

## 2016-05-09 MED ORDER — IBUPROFEN 600 MG PO TABS
600.0000 mg | ORAL_TABLET | Freq: Four times a day (QID) | ORAL | Status: DC
  Administered 2016-05-10 – 2016-05-11 (×6): 600 mg via ORAL
  Filled 2016-05-09 (×6): qty 1

## 2016-05-09 MED ORDER — DEXTROSE IN LACTATED RINGERS 5 % IV SOLN
INTRAVENOUS | Status: DC
  Administered 2016-05-09: 12:00:00 via INTRAVENOUS

## 2016-05-09 MED ORDER — BISACODYL 10 MG RE SUPP
10.0000 mg | Freq: Every day | RECTAL | Status: DC | PRN
Start: 2016-05-09 — End: 2016-05-11
  Filled 2016-05-09: qty 1

## 2016-05-09 MED ORDER — SCOPOLAMINE 1 MG/3DAYS TD PT72
MEDICATED_PATCH | TRANSDERMAL | Status: AC
  Filled 2016-05-09: qty 1

## 2016-05-09 MED ORDER — MORPHINE SULFATE (PF) 4 MG/ML IV SOLN: 1.0000 mg | INTRAVENOUS | Status: DC | PRN

## 2016-05-09 MED ORDER — PROMETHAZINE HCL 25 MG/ML IJ SOLN: 6.2500 mg | INTRAMUSCULAR | Status: DC | PRN

## 2016-05-09 MED ORDER — SIMETHICONE 80 MG PO CHEW
80.0000 mg | CHEWABLE_TABLET | Freq: Three times a day (TID) | ORAL | Status: DC
  Administered 2016-05-10 – 2016-05-11 (×3): 80 mg via ORAL
  Filled 2016-05-09 (×7): qty 1

## 2016-05-09 MED ORDER — KETOROLAC TROMETHAMINE 30 MG/ML IJ SOLN
INTRAMUSCULAR | Status: AC
  Administered 2016-05-09: 30 mg via INTRAMUSCULAR
  Filled 2016-05-09: qty 1

## 2016-05-09 MED ORDER — METHYLERGONOVINE MALEATE 0.2 MG PO TABS: 0.2000 mg | ORAL_TABLET | ORAL | Status: DC | PRN

## 2016-05-09 MED ORDER — PRENATAL MULTIVITAMIN CH
1.0000 | ORAL_TABLET | Freq: Every day | ORAL | Status: DC
  Administered 2016-05-10 – 2016-05-11 (×2): 1 via ORAL
  Filled 2016-05-09 (×2): qty 1

## 2016-05-09 MED ORDER — OXYTOCIN BOLUS FROM INFUSION: 500.0000 mL | Freq: Once | INTRAVENOUS | Status: DC

## 2016-05-09 MED ORDER — LACTATED RINGERS IV SOLN
INTRAVENOUS | Status: DC
  Administered 2016-05-09 (×3): via INTRAVENOUS

## 2016-05-09 MED ORDER — OXYTOCIN 10 UNIT/ML IJ SOLN
INTRAVENOUS | Status: DC | PRN
  Administered 2016-05-09: 40 [IU] via INTRAVENOUS

## 2016-05-09 MED ORDER — INSULIN ASPART 100 UNIT/ML ~~LOC~~ SOLN
0.0000 [IU] | SUBCUTANEOUS | Status: DC
  Administered 2016-05-10: 2 [IU] via SUBCUTANEOUS

## 2016-05-09 MED ORDER — SIMETHICONE 80 MG PO CHEW
80.0000 mg | CHEWABLE_TABLET | ORAL | Status: DC
  Administered 2016-05-09 – 2016-05-11 (×2): 80 mg via ORAL
  Filled 2016-05-09 (×4): qty 1

## 2016-05-09 MED ORDER — METHYLERGONOVINE MALEATE 0.2 MG/ML IJ SOLN: 0.2000 mg | INTRAMUSCULAR | Status: DC | PRN

## 2016-05-09 MED ORDER — SENNOSIDES-DOCUSATE SODIUM 8.6-50 MG PO TABS
2.0000 | ORAL_TABLET | ORAL | Status: DC
  Administered 2016-05-09 – 2016-05-11 (×2): 2 via ORAL
  Filled 2016-05-09 (×4): qty 2

## 2016-05-09 MED ORDER — BUPIVACAINE IN DEXTROSE 0.75-8.25 % IT SOLN
INTRATHECAL | Status: DC | PRN
Start: 2016-05-09 — End: 2016-05-09
  Administered 2016-05-09: 1.4 mL via INTRATHECAL

## 2016-05-09 MED ORDER — MENTHOL 3 MG MT LOZG: 1.0000 | LOZENGE | OROMUCOSAL | Status: DC | PRN

## 2016-05-09 MED ORDER — OXYTOCIN 40 UNITS IN LACTATED RINGERS INFUSION - SIMPLE MED
1.0000 m[IU]/min | INTRAVENOUS | Status: DC
  Filled 2016-05-09: qty 1000

## 2016-05-09 MED ORDER — OXYTOCIN 40 UNITS IN LACTATED RINGERS INFUSION - SIMPLE MED
2.5000 [IU]/h | INTRAVENOUS | Status: AC
Start: 2016-05-09 — End: 2016-05-10

## 2016-05-09 MED ORDER — LIDOCAINE HCL (PF) 1 % IJ SOLN: 30.0000 mL | INTRAMUSCULAR | Status: DC | PRN

## 2016-05-09 MED ORDER — SODIUM CHLORIDE 0.9 % IR SOLN
Status: DC | PRN
  Administered 2016-05-09: 1

## 2016-05-09 MED ORDER — LACTATED RINGERS IV SOLN: 500.0000 mL | INTRAVENOUS | Status: DC | PRN

## 2016-05-09 MED ORDER — MORPHINE SULFATE (PF) 0.5 MG/ML IJ SOLN
INTRAMUSCULAR | Status: DC | PRN
  Administered 2016-05-09: .2 mg via INTRATHECAL

## 2016-05-09 MED ORDER — DIPHENHYDRAMINE HCL 25 MG PO CAPS
25.0000 mg | ORAL_CAPSULE | Freq: Four times a day (QID) | ORAL | Status: DC | PRN
  Administered 2016-05-09: 25 mg via ORAL
  Filled 2016-05-09 (×3): qty 1

## 2016-05-09 MED ORDER — DIBUCAINE 1 % RE OINT: 1.0000 "application " | TOPICAL_OINTMENT | RECTAL | Status: DC | PRN

## 2016-05-09 SURGICAL SUPPLY — 32 items
BENZOIN TINCTURE PRP APPL 2/3 (GAUZE/BANDAGES/DRESSINGS) ×2 IMPLANT
CHLORAPREP W/TINT 26ML (MISCELLANEOUS) ×2 IMPLANT
CLAMP CORD UMBIL (MISCELLANEOUS) IMPLANT
CLOTH BEACON ORANGE TIMEOUT ST (SAFETY) ×2 IMPLANT
DRSG OPSITE POSTOP 4X10 (GAUZE/BANDAGES/DRESSINGS) ×2 IMPLANT
ELECT REM PT RETURN 9FT ADLT (ELECTROSURGICAL) ×2
ELECTRODE REM PT RTRN 9FT ADLT (ELECTROSURGICAL) ×1 IMPLANT
EXTRACTOR VACUUM BELL STYLE (SUCTIONS) IMPLANT
GAUZE SPONGE 4X4 12PLY STRL LF (GAUZE/BANDAGES/DRESSINGS) ×2 IMPLANT
GLOVE BIO SURGEON STRL SZ7 (GLOVE) ×2 IMPLANT
GLOVE BIOGEL PI IND STRL 7.0 (GLOVE) ×1 IMPLANT
GLOVE BIOGEL PI INDICATOR 7.0 (GLOVE) ×1
GOWN STRL REUS W/TWL LRG LVL3 (GOWN DISPOSABLE) ×4 IMPLANT
KIT ABG SYR 3ML LUER SLIP (SYRINGE) IMPLANT
NEEDLE HYPO 25X5/8 SAFETYGLIDE (NEEDLE) IMPLANT
NS IRRIG 1000ML POUR BTL (IV SOLUTION) ×2 IMPLANT
PACK C SECTION WH (CUSTOM PROCEDURE TRAY) ×2 IMPLANT
PAD ABD 7.5X8 STRL (GAUZE/BANDAGES/DRESSINGS) ×2 IMPLANT
PAD OB MATERNITY 4.3X12.25 (PERSONAL CARE ITEMS) ×2 IMPLANT
PENCIL SMOKE EVAC W/HOLSTER (ELECTROSURGICAL) ×2 IMPLANT
RTRCTR C-SECT PINK 25CM LRG (MISCELLANEOUS) ×2 IMPLANT
STRIP CLOSURE SKIN 1/2X4 (GAUZE/BANDAGES/DRESSINGS) ×2 IMPLANT
SUT MNCRL 0 VIOLET CTX 36 (SUTURE) ×2 IMPLANT
SUT MONOCRYL 0 CTX 36 (SUTURE) ×2
SUT PLAIN 2 0 XLH (SUTURE) ×4 IMPLANT
SUT VIC AB 0 CT1 27 (SUTURE) ×2
SUT VIC AB 0 CT1 27XBRD ANBCTR (SUTURE) ×2 IMPLANT
SUT VIC AB 2-0 CT1 27 (SUTURE) ×1
SUT VIC AB 2-0 CT1 TAPERPNT 27 (SUTURE) ×1 IMPLANT
SUT VIC AB 4-0 KS 27 (SUTURE) ×2 IMPLANT
TOWEL OR 17X24 6PK STRL BLUE (TOWEL DISPOSABLE) ×2 IMPLANT
TRAY FOLEY BAG SILVER LF 14FR (SET/KITS/TRAYS/PACK) ×2 IMPLANT

## 2016-05-09 NOTE — Progress Notes (Signed)
Theresa Koch Dock, RN PACU nurse during recovery

## 2016-05-09 NOTE — Anesthesia Pain Management Evaluation Note (Signed)
  CRNA Pain Management Visit Note  Patient: Theresa Koch, 36 y.o., female  "Hello I am a member of the anesthesia team [facility]. We have an anesthesia team available at all times to provide care throughout the hospital, including epidural management and anesthesia for C-section. I don't know your plan for the delivery whether it a natural birth, water birth, IV sedation, nitrous supplementation, doula or epidural, but we want to meet your pain goals."   1.Was your pain managed to your expectations on prior hospitalizations?   Yes   2.What is your expectation for pain management during this hospitalization?     IV pain meds  3.How can we help you reach that goal? Be available if needeed  Record the patient's initial score and the patient's pain goal.   Pain: 0  Pain Goal: 6 [facility] wants you to be able to say your pain was always managed very well.  [name] 05/09/2016

## 2016-05-09 NOTE — Anesthesia Preprocedure Evaluation (Addendum)
Anesthesia Evaluation  Patient identified by MRN, date of birth, ID band Patient awake    Reviewed: Allergy & Precautions, NPO status , Patient's Chart, lab work & pertinent test results  History of Anesthesia Complications Negative for: history of anesthetic complications  Airway Mallampati: II  TM Distance: >3 FB Neck ROM: Full    Dental  (+) Dental Advisory Given   Pulmonary neg pulmonary ROS,    breath sounds clear to auscultation       Cardiovascular negative cardio ROS   Rhythm:Regular Rate:Normal     Neuro/Psych negative neurological ROS     GI/Hepatic Neg liver ROS, GERD  ,  Endo/Other  diabetes (insulin pump), Type 1, Insulin Dependent  Renal/GU negative Renal ROS     Musculoskeletal   Abdominal   Peds  Hematology  (+) Blood dyscrasia (Hb 9.1, plt 147k), anemia ,   Anesthesia Other Findings   Reproductive/Obstetrics (+) Pregnancy (breech)                            Anesthesia Physical Anesthesia Plan  ASA: III  Anesthesia Plan: Spinal   Post-op Pain Management:    Induction:   Airway Management Planned: Natural Airway  Additional Equipment:   Intra-op Plan:   Post-operative Plan:   Informed Consent: I have reviewed the patients History and Physical, chart, labs and discussed the procedure including the risks, benefits and alternatives for the proposed anesthesia with the patient or authorized representative who has indicated his/her understanding and acceptance.   Dental advisory given  Plan Discussed with: CRNA and Surgeon  Anesthesia Plan Comments: (Plan routine monitors, SAB)        Anesthesia Quick Evaluation

## 2016-05-09 NOTE — Progress Notes (Signed)
Sugars are better:  CBG (last 3)   Recent Labs  05/09/16 1353 05/09/16 1514 05/09/16 1627  GLUCAP 71 111* 71     Will turn off D5 when sugars go to >= 200. NST R, cat 1.

## 2016-05-09 NOTE — Brief Op Note (Signed)
05/09/2016  7:16 PM  PATIENT:  Theresa Koch  36 y.o. female  PRE-OPERATIVE DIAGNOSIS:  Repeat for breech  POST-OPERATIVE DIAGNOSIS:  Repeat for breech  PROCEDURE:  Procedure(s): CESAREAN SECTION (N/Koch)  SURGEON:  Surgeon(s) and Role:    * Lonzo Saulter, MD - Primary   ASSISTANTS: RFNP   ANESTHESIA:   spinal  EBL:  Total I/O In: 500 [I.V.:500] Out: -    LOCAL MEDICATIONS USED:  MARCAINE     SPECIMEN:  No Specimen  DISPOSITION OF SPECIMEN:  N/Koch  COUNTS:  YES  TOURNIQUET:  * No tourniquets in log *  DICTATION: .Note written in EPIC  PLAN OF CARE: Admit to inpatient   PATIENT DISPOSITION:  PACU - hemodynamically stable.   Delay start of Pharmacological VTE agent (>24hrs) due to surgical blood loss or risk of bleeding: not applicable  Complications:  None. Medications:  Ancef, Pitocin Findings:  Baby female, Apgars 10,10, weight P.   Normal tubes, ovaries and uterus seen.  Baby was skin to skin with mother after birth in the OR.  Technique:  After adequate spinal anesthesia was achieved, the patient was prepped and draped in usual sterile fashion.  Koch foley catheter was used to drain the bladder.  Koch pfannanstiel incision was made with the scalpel and carried down to the fascia with the bovie cautery. The fascia was incised in the midline with the scalpel and carried in Koch transverse curvilinear manner bilaterally.  The fascia was reflected superiorly and inferiorly off the rectus muscles and the muscles split in the midline.  Koch bowel free portion of the peritoneum was entered bluntly and then extended in Koch superior and inferior manner with good visualization of the bowel and bladder.  The Alexis instrument was then placed and the vesico-uterine fascia tented up and incised in Koch transverse curvilinear manner.  Koch 2 cm transverse incision was made in the upper portion of the lower uterine segment until the amnion was exposed.   The incision was extended transversely in Koch blunt  manner.  Clear fluid was noted and the baby delivered in the breech presentation without complication.  The baby was bulb suctioned and the cord was clamped and cut.  The baby was then handed to awaiting Neonatology.  The placenta was then delivered manually and the uterus cleared of all debris.  The uterine incision was then closed with Koch running lock stitch of 0 monocryl.  An imbricating layer of 0 monocryl was closed as well. Excellent hemostasis of the uterine incision was achieved and the abdomen was cleared with irrigation.  The peritoneum was closed with Koch running stitch of 2-0 vicryl.  This incorporated the rectus muscles as Koch separate layer.  The fascia was then closed with Koch running stitch of 0 vicryl.  The subcutaneous layer was closed with interrupted  stitches of 2-0 plain gut.  The skin was closed with 4-0 vicryl on Koch Keith needle and steri-strips.  The patient tolerated the procedure well and was returned to the recovery room in stable condition.  All counts were correct times three.  Theresa Koch  

## 2016-05-09 NOTE — Anesthesia Postprocedure Evaluation (Signed)
Anesthesia Post Note  Patient: Theresa Koch  Procedure(s) Performed: Procedure(s) (LRB): CESAREAN SECTION (N/A)  Patient location during evaluation: PACU Anesthesia Type: Spinal Level of consciousness: awake and alert, oriented and patient cooperative Pain management: pain level controlled Vital Signs Assessment: post-procedure vital signs reviewed and stable Respiratory status: spontaneous breathing, nonlabored ventilation and respiratory function stable Cardiovascular status: blood pressure returned to baseline and stable Postop Assessment: spinal receding, adequate PO intake and no signs of nausea or vomiting Anesthetic complications: no        Last Vitals:  Vitals:   05/09/16 2045 05/09/16 2102  BP: (!) 95/51 (!) 97/43  Pulse: (!) 58 68  Resp: 17 16  Temp:      Last Pain:  Vitals:   05/09/16 2030  TempSrc:   PainSc: 2    Pain Goal: Patients Stated Pain Goal: 0 (05/09/16 2030)               Ayssa Bentivegna,E. Tonisha Silvey

## 2016-05-09 NOTE — Progress Notes (Addendum)
CBG (last 3)   Recent Labs  05/09/16 1151 05/09/16 1208 05/09/16 1222  GLUCAP 43* 69 70    Low sugar- pt took insulin this am and did not eat.  Now had some juice and changed IVF to D5LR at 125 cc/hour.    Pt feeling better- sugar checked just now down to 59, more juice being given. Otherwise stable.  FHTs NST R.  Continue q 1 hour CBGs until sugar up further.

## 2016-05-09 NOTE — H&P (Signed)
36 y.o. 39 weeks G3P2002 comes in for induction for Type 1 Dm.  Otherwise has good fetal movement and no bleeding.  Sugars have been controlled with insulin- did have some trouble in last trimester with low sugars but has been overall good. PT desires VBAC- had successful VBAC previously.   Past Medical History:  Diagnosis Date  . Diabetes mellitus without complication [facility](HCC)     Past Surgical History:  Procedure Laterality Date  . CESAREAN SECTION      OB History  Gravida Para Term Preterm AB Living  3 2 2     2   SAB TAB Ectopic Multiple Live Births        0 2    # Outcome Date GA Lbr Len/2nd Weight Sex Delivery Anes PTL Lv  3 Current           2 Term 08/09/14 5363w3d / 01:32 7 lb 9.2 oz (3.436 kg) F Vag-Spont EPI  LIV  1 Term 02/28/12 2546w0d  5 lb 15.2 oz (2.7 kg) F CS-LTranv EPI N LIV    Obstetric Comments  Previous cs for diabetes    Social History   Social History  . Marital status: Married    Spouse name: N/A  . Number of children: N/A  . Years of education: N/A   Occupational History  . Not on file.   Social History Main Topics  . Smoking status: Never Smoker  . Smokeless tobacco: Never Used  . Alcohol use No  . Drug use: No  . Sexual activity: Not Currently    Birth control/ protection: Implant   Other Topics Concern  . Not on file   Social History Narrative  . No narrative on file   [place]illins    Prenatal Transfer Tool  Maternal Diabetes: Yes:  Diabetes Type:  Pre-pregnancy, insulin, A1C at beginning of pregnancy was 6.5.  24 hour urine 115 gm.   Genetic Screening: Normal Maternal Ultrasounds/Referrals: Normal Fetal Ultrasounds or other Referrals:  None Maternal Substance Abuse:  No Significant Maternal Medications:  Meds include: Other: insulin Significant Maternal Lab Results: Lab values include: Other: see above  Other PNC: ANT all reassuring.    Vitals:   05/09/16 0833 05/09/16 0841  BP: 121/68   Pulse: 83   Resp: 18   Temp: 98 F (36.7  C)   TempSrc: Oral   Weight:  175 lb (79.4 kg)  Height:  5\' 5"  (1.651 m)    Lungs/Cor:  NAD Abdomen:  soft, gravid Ex:  no cords, erythema SVE:  2/50/-2, unsure if successful AROM. FHTs:  120, good STV, NST R Toco:  q none   A/P   36 y.o. G3P2002 39 weeks with Type 1 Dm.  For induction.  Glucomander.   GBS neg.  First sugar this am was 117.  Theresa Koch A

## 2016-05-09 NOTE — Anesthesia Procedure Notes (Signed)
Spinal  Patient location during procedure: OR End time: 05/09/2016 6:26 PM Staffing Anesthesiologist: Jairo BenJACKSON, Jozey Janco Performed: anesthesiologist  Preanesthetic Checklist Completed: patient identified, surgical consent, pre-op evaluation, timeout performed, IV checked, risks and benefits discussed and monitors and equipment checked Spinal Block Patient position: sitting Prep: site prepped and draped and DuraPrep Patient monitoring: blood pressure, continuous pulse ox, heart rate and cardiac monitor Approach: midline Location: L3-4 Injection technique: single-shot Needle Needle type: Pencan  Needle gauge: 24 G Needle length: 9 cm Additional Notes Pt identified in Operating room.  Monitors applied. Working IV access confirmed. Sterile prep, drape lumbar spine.  1% lido local L 3,4.  #24ga Pencan into clear CSF L 3,4.  10.5 mg 0.75% Bupivacaine with dextrose, fentanyl, morphine injected with asp CSF beginning and end of injection.  Patient asymptomatic, VSS, no heme aspirated, tolerated well.  Sandford Craze Damaria Stofko, MD

## 2016-05-09 NOTE — Progress Notes (Signed)
Pt called out wanting RN to check blood sugar. Result of CBG was 31. Juice given, MD called. RN to recheck CBG in 15 minutes. If low, change fluids to D5 per MD. If above 70, continue to check CBG as ordered.

## 2016-05-09 NOTE — Progress Notes (Signed)
Checked pt- no change.  2/50/-2  However, after rupture, presenting part did not feel like head.    Bedside US confirms frank breech.   Explained to pt that US done this week and cervical exam in office both indicated vtx. Explained that baby could have changed position over that time. Explained that earlier we had no suspicion of breech and that it did not become obvious until ROM. Explained that once ROM, we are unable to anything but proceed with C/S.  Pt voiced understanding.  All risks and benefits d/w pt and she consents to proceed.

## 2016-05-09 NOTE — Op Note (Signed)
05/09/2016  7:16 PM  PATIENT:  Theresa Koch  36 y.o. female  PRE-OPERATIVE DIAGNOSIS:  Repeat for breech  POST-OPERATIVE DIAGNOSIS:  Repeat for breech  PROCEDURE:  Procedure(s): CESAREAN SECTION (N/A)  SURGEON:  Surgeon(s) and Role:    * Carrington ClampHorvath, Nira Visscher, MD - Primary   ASSISTANTS: RFNP   ANESTHESIA:   spinal  EBL:  Total I/O In: 500 [I.V.:500] Out: -    LOCAL MEDICATIONS USED:  MARCAINE     SPECIMEN:  No Specimen  DISPOSITION OF SPECIMEN:  N/A  COUNTS:  YES  TOURNIQUET:  * No tourniquets in log *  DICTATION: .Note written in EPIC  PLAN OF CARE: Admit to inpatient   PATIENT DISPOSITION:  PACU - hemodynamically stable.   Delay start of Pharmacological VTE agent (>24hrs) due to surgical blood loss or risk of bleeding: not applicable  Complications:  None. Medications:  Ancef, Pitocin Findings:  Baby female, Apgars 10,10, weight P.   Normal tubes, ovaries and uterus seen.  Baby was skin to skin with mother after birth in the OR.  Technique:  After adequate spinal anesthesia was achieved, the patient was prepped and draped in usual sterile fashion.  A foley catheter was used to drain the bladder.  A pfannanstiel incision was made with the scalpel and carried down to the fascia with the bovie cautery. The fascia was incised in the midline with the scalpel and carried in a transverse curvilinear manner bilaterally.  The fascia was reflected superiorly and inferiorly off the rectus muscles and the muscles split in the midline.  A bowel free portion of the peritoneum was entered bluntly and then extended in a superior and inferior manner with good visualization of the bowel and bladder.  The Alexis instrument was then placed and the vesico-uterine fascia tented up and incised in a transverse curvilinear manner.  A 2 cm transverse incision was made in the upper portion of the lower uterine segment until the amnion was exposed.   The incision was extended transversely in a blunt  manner.  Clear fluid was noted and the baby delivered in the breech presentation without complication.  The baby was bulb suctioned and the cord was clamped and cut.  The baby was then handed to awaiting Neonatology.  The placenta was then delivered manually and the uterus cleared of all debris.  The uterine incision was then closed with a running lock stitch of 0 monocryl.  An imbricating layer of 0 monocryl was closed as well. Excellent hemostasis of the uterine incision was achieved and the abdomen was cleared with irrigation.  The peritoneum was closed with a running stitch of 2-0 vicryl.  This incorporated the rectus muscles as a separate layer.  The fascia was then closed with a running stitch of 0 vicryl.  The subcutaneous layer was closed with interrupted  stitches of 2-0 plain gut.  The skin was closed with 4-0 vicryl on a Keith needle and steri-strips.  The patient tolerated the procedure well and was returned to the recovery room in stable condition.  All counts were correct times three.  Oisin Yoakum A

## 2016-05-09 NOTE — Transfer of Care (Signed)
Immediate Anesthesia Transfer of Care Note  Patient: Theresa Koch  Procedure(s) Performed: Procedure(s): CESAREAN SECTION (N/A)  Patient Location: PACU  Anesthesia Type:Spinal  Level of Consciousness: awake, alert  and oriented  Airway & Oxygen Therapy: Patient Spontanous Breathing  Post-op Assessment: Report given to RN and Post -op Vital signs reviewed and stable  Post vital signs: Reviewed  Last Vitals:  Vitals:   05/09/16 1701 05/09/16 1718  BP: 122/75 (!) 144/76  Pulse: 80 88  Resp: 18 18  Temp:      Last Pain:  Vitals:   05/09/16 1332  TempSrc:   PainSc: 0-No pain         Complications: No apparent anesthesia complications

## 2016-05-10 LAB — CBC
HCT: 25.7 % — ABNORMAL LOW (ref 36.0–46.0)
HEMOGLOBIN: 8.6 g/dL — AB (ref 12.0–15.0)
MCH: 26.4 pg (ref 26.0–34.0)
MCHC: 33.5 g/dL (ref 30.0–36.0)
MCV: 78.8 fL (ref 78.0–100.0)
Platelets: 125 10*3/uL — ABNORMAL LOW (ref 150–400)
RBC: 3.26 MIL/uL — ABNORMAL LOW (ref 3.87–5.11)
RDW: 14.6 % (ref 11.5–15.5)
WBC: 7.3 10*3/uL (ref 4.0–10.5)

## 2016-05-10 LAB — GLUCOSE, CAPILLARY
GLUCOSE-CAPILLARY: 69 mg/dL (ref 65–99)
GLUCOSE-CAPILLARY: 85 mg/dL (ref 65–99)
Glucose-Capillary: 109 mg/dL — ABNORMAL HIGH (ref 65–99)
Glucose-Capillary: 67 mg/dL (ref 65–99)
Glucose-Capillary: 131 mg/dL — ABNORMAL HIGH (ref 65–99)

## 2016-05-10 LAB — RPR: RPR: NONREACTIVE

## 2016-05-10 MED ORDER — OXYCODONE HCL 10 MG PO TABS: 10.0000 mg | ORAL_TABLET | ORAL | 0 refills | Status: DC | PRN

## 2016-05-10 NOTE — Lactation Note (Signed)
This note was copied from a baby's chart. Lactation Consultation Note  Patient Name: Theresa Koch Today's Date: 05/10/2016  Infant is 1122 hours old & seen by Lactation for Initial assessment. Baby was born at 147w3d and weighed 7 lbs 4.4oz at birth. Mom has Type 1 DM and reports that she has had it since her first pregnancy. Reminded mom that she will need to monitor her BG because sometimes when women BF, they need less insulin. Mom reports she BF her other 2 children for ~3822m and the only concern was that she could hear clicking when they latched & felt as though the babies took in a lot of air while BF, which mom thinks this made babies gassy often. Mom was BF baby on right breast in cradle hold [facility] and baby had a good latch (unsure what time feeding started). Baby soon came off and then mom latched baby to left breast in cradle hold. Baby again latched with no problems and suckled; no clicking was noted and mom reports she hasn't heard it with this baby. Discussed how mom could try laid-back BF if she noticed any clicking while BF and to have someone take a look at the latch if she hears clicking. Mom reports no breast or nipple pain but has noticed cramping while BF; discussed how this is a good sign that her Oxytocin is helping contract her uterus back to prepregnancy size. Mom reports she has WIC and will be staying home with her children. Provided mom with hand pump for occasional use- showed her how to use & clean; baby was BF so mom did not use with LC in the room- encouraged mom to try before going home to ensure the flange is the correct size. Provided mom with BF booklet, BF resources, and feeding log; mom made aware of O/P services, breastfeeding support groups, community resources, and our phone # for post-discharge questions. Mom encouraged to feed baby 8-12 times/24 hours and with feeding cues.  Mom reports no questions at this time. Encouraged mom to [facility] if help is needed  later.    Maternal Data    Feeding Feeding Type: Breast Fed Length of feed: 15 min  LATCH Score/Interventions                      Lactation Tools Discussed/Used     Consult Status      [name] 05/10/2016, 5:14 PM

## 2016-05-10 NOTE — Discharge Summary (Signed)
Obstetric Discharge Summary Reason for Admission: induction of labor Prenatal Procedures: NST, ultrasound and type 1 dm Intrapartum Procedures: cesarean: low cervical, transverse for breech Postpartum Procedures: none Complications-Operative and Postpartum: none Hemoglobin  Date Value Ref Range Status  05/10/2016 8.6 (L) 12.0 - 15.0 g/dL Final   HCT  Date Value Ref Range Status  05/10/2016 25.7 (L) 36.0 - 46.0 % Final    Discharge Diagnoses: Term Pregnancy-delivered  Discharge Information: Date: 05/10/2016 Activity: pelvic rest Diet: routine Medications: Ibuprofen and Percocet, insulin per diabetes doctor - pt to call. Currently does not need insulin- sugars have been low. Condition: stable Instructions: refer to practice specific booklet Discharge to: home Follow-up Information    Carrington ClampHorvath, Theresa Liggins, MD Follow up in 1 week(s).   Specialty:  Obstetrics and Gynecology Contact information: [address] [place] [id number] [phone]           Newborn Data: Live born female  Birth Weight: 7 lb 4.4 oz (3300 g) APGAR: 10, 10  Home with mother.  [name] A 05/10/2016, 10:25 AM

## 2016-05-10 NOTE — Progress Notes (Addendum)
  Patient is eating, ambulating, voiding.  Pain control is good.  Vitals:   05/10/16 0031 05/10/16 0133 05/10/16 0232 05/10/16 0623  BP: (!) 104/43 104/65 105/85 (!) 100/46  Pulse: 60 61 94 60  Resp: 18 18 18 18   Temp: 98 F (36.7 C) 98.1 F (36.7 C) 98.2 F (36.8 C) 98.2 F (36.8 C)  TempSrc: Oral Oral Oral Oral  SpO2: 93% 93% 93% 94%  Weight:      Height:        lungs:   clear to auscultation cor:    RRR Abdomen:  soft, appropriate tenderness, incisions intact and without erythema or exudate ex:    no cords   Lab Results  Component Value Date   WBC 7.3 05/10/2016   HGB 8.6 (L) 05/10/2016   HCT 25.7 (L) 05/10/2016   MCV 78.8 05/10/2016   PLT 125 (L) 05/10/2016   CBG (last 3)   Recent Labs  05/10/16 0119 05/10/16 0141 05/10/16 0932  GLUCAP 69 109* 67     --/--/O POS (05/04 0848)/RI  A/P    Post operative day 1.  Routine post op and postpartum care.  Expect d/c possibly early.  Percocet for pain control. Insulin not restarted yet.

## 2016-05-10 NOTE — Anesthesia Postprocedure Evaluation (Signed)
Anesthesia Post Note  Patient: Theresa Koch  Procedure(s) Performed: Procedure(s) (LRB): CESAREAN SECTION (N/A)  Patient location during evaluation: Mother Baby Anesthesia Type: Epidural Level of consciousness: awake and alert, oriented and patient cooperative Pain management: pain level controlled Vital Signs Assessment: post-procedure vital signs reviewed and stable Respiratory status: spontaneous breathing Cardiovascular status: stable Postop Assessment: no headache, epidural receding, patient able to bend at knees and no signs of nausea or vomiting Anesthetic complications: no Comments: Pain score 0.        Last Vitals:  Vitals:   05/10/16 0232 05/10/16 0623  BP: 105/85 (!) 100/46  Pulse: 94 60  Resp: 18 18  Temp: 36.8 C 36.8 C    Last Pain:  Vitals:   05/10/16 0700  TempSrc:   PainSc: 0-No pain   Pain Goal: Patients Stated Pain Goal: 0 (05/09/16 2102)               Merrilyn PumaWRINKLE,Rhianon Zabawa

## 2016-05-10 NOTE — Addendum Note (Signed)
Addendum  created 05/10/16 [id number] by Angela AdamWrinkle, Jamesia Linnen G, CRNA   Sign clinical note

## 2016-05-11 ENCOUNTER — Ambulatory Visit: Payer: Self-pay

## 2016-05-11 LAB — GLUCOSE, CAPILLARY
Glucose-Capillary: 118 mg/dL — ABNORMAL HIGH (ref 65–99)
Glucose-Capillary: 82 mg/dL (ref 65–99)

## 2016-05-11 NOTE — Progress Notes (Signed)
CBG for 12am did not transfer over. Pt. CBG 86. RN Tia aware

## 2016-05-11 NOTE — Progress Notes (Signed)
  Patient is eating, ambulating, voiding.  Pain control is good.  Vitals:   05/10/16 1323 05/10/16 1400 05/10/16 1741 05/11/16 0530  BP: (!) 108/58 110/60 (!) 96/50 (!) 105/55  Pulse: 62 60 77 67  Resp: 16 18 12 16   Temp: 98.1 F (36.7 C) 98.4 F (36.9 C) 98.7 F (37.1 C) 98.4 F (36.9 C)  TempSrc: Oral Oral Oral Oral  SpO2: 95%  100%   Weight:      Height:        lungs:   clear to auscultation cor:    RRR Abdomen:  soft, appropriate tenderness, incisions intact and without erythema or exudate ex:    no cords   Lab Results  Component Value Date   WBC 7.3 05/10/2016   HGB 8.6 (L) 05/10/2016   HCT 25.7 (L) 05/10/2016   MCV 78.8 05/10/2016   PLT 125 (L) 05/10/2016   CBG (last 3)   Recent Labs  05/10/16 2004 05/11/16 0410 05/11/16 0828  GLUCAP 85 118* 82     --/--/O POS (05/04 0848)/RI  A/P    Post operative day 2.  Routine post op and postpartum care.  Expect d/c today.  Percocet for pain control.

## 2016-05-11 NOTE — Lactation Note (Signed)
This note was copied from a baby's chart. Lactation Consultation Note  Mother is using formula because she has "no milk" showed her how to hand express and colostrum was easily expressible. Encouraged her to BF on cue prior to offering formula. Follow-up prior to discharge. Patient Name: Theresa Koch ZOXWR'UToday's Date: 05/11/2016 Reason for consult: Follow-up assessment   Maternal Data Formula Feeding for Exclusion: No Has patient been taught Hand Expression?: Yes  Feeding Feeding Type: Breast Fed Length of feed: 25 min  LATCH Score/Interventions Latch: Grasps breast easily, tongue down, lips flanged, rhythmical sucking.  Audible Swallowing: A few with stimulation Intervention(s): Skin to skin;Hand expression  Type of Nipple: Everted at rest and after stimulation  Comfort (Breast/Nipple): Soft / non-tender     Hold (Positioning): No assistance needed to correctly position infant at breast. Intervention(s): Skin to skin  LATCH Score: 9  Lactation Tools Discussed/Used     Consult Status Consult Status: Follow-up Date: 05/12/16 Follow-up type: In-patient    Theresa DryerJoseph, Theresa Koch 05/11/2016, 12:56 PM

## 2016-05-12 ENCOUNTER — Ambulatory Visit: Payer: Self-pay

## 2016-05-12 ENCOUNTER — Encounter (HOSPITAL_COMMUNITY): Payer: Self-pay | Admitting: Obstetrics and Gynecology

## 2016-05-12 LAB — GLUCOSE, CAPILLARY
GLUCOSE-CAPILLARY: 55 mg/dL — AB (ref 65–99)
GLUCOSE-CAPILLARY: 61 mg/dL — AB (ref 65–99)
GLUCOSE-CAPILLARY: 57 mg/dL — AB (ref 65–99)
Glucose-Capillary: 60 mg/dL — ABNORMAL LOW (ref 65–99)
Glucose-Capillary: 65 mg/dL (ref 65–99)
Glucose-Capillary: 69 mg/dL (ref 65–99)
Glucose-Capillary: 48 mg/dL — ABNORMAL LOW (ref 65–99)

## 2016-05-12 NOTE — Lactation Note (Signed)
This note was copied from a baby's chart. Lactation Consultation Note Ex BF.  P3, Baby 62 hours old.  Mother latched baby easily in cradle hold.  Intermittent sucks and swallows observed. Mom encouraged to feed baby 8-12 times/24 hours and with feeding cues before offering formula to help establish milk supply. Reviewed engorgement care and monitoring voids/stools. Mother has manual hand pump.   Patient Name: Theresa Tora PerchesHoyam Markov JKKXF'GToday's Date: 05/12/2016 Reason for consult: Follow-up assessment   Maternal Data    Feeding Feeding Type: Breast Fed Length of feed: 30 min  LATCH Score/Interventions Latch: Grasps breast easily, tongue down, lips flanged, rhythmical sucking.  Audible Swallowing: A few with stimulation  Type of Nipple: Everted at rest and after stimulation  Comfort (Breast/Nipple): Soft / non-tender     Hold (Positioning): No assistance needed to correctly position infant at breast.  LATCH Score: 9  Lactation Tools Discussed/Used     Consult Status      [name], [name] 05/12/2016, 9:01 AM

## 2016-05-15 IMAGING — US US OB COMP LESS 14 WK
1 series · 14 of 28 positions shown · non-contrast
Comparison: None.

CLINICAL DATA: Positive pregnancy test, size date discrepancy,
unsure dates

EXAM:
OBSTETRIC <14 WK ULTRASOUND
TECHNIQUE: Transabdominal ultrasound was performed for evaluation of the
gestation as well as the maternal uterus and adnexal regions.

[Series 1: us ob comp less 14 wks · 44 acquisitions, 14 frames shown]
[im 2/44]
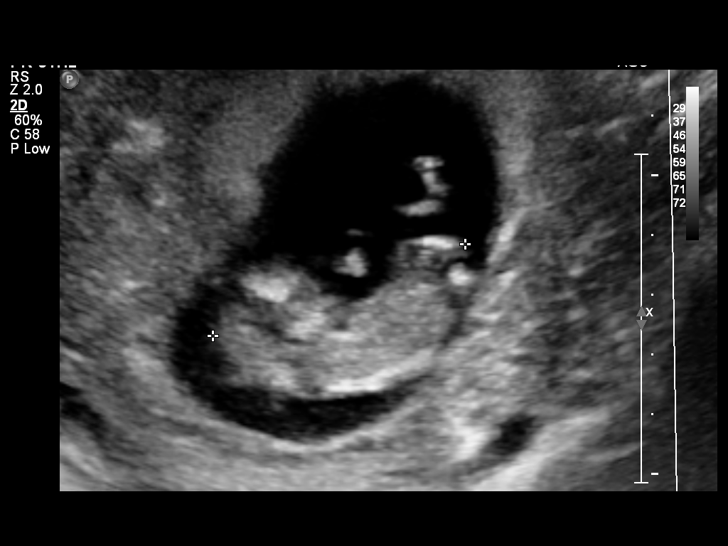
[im 5/44]
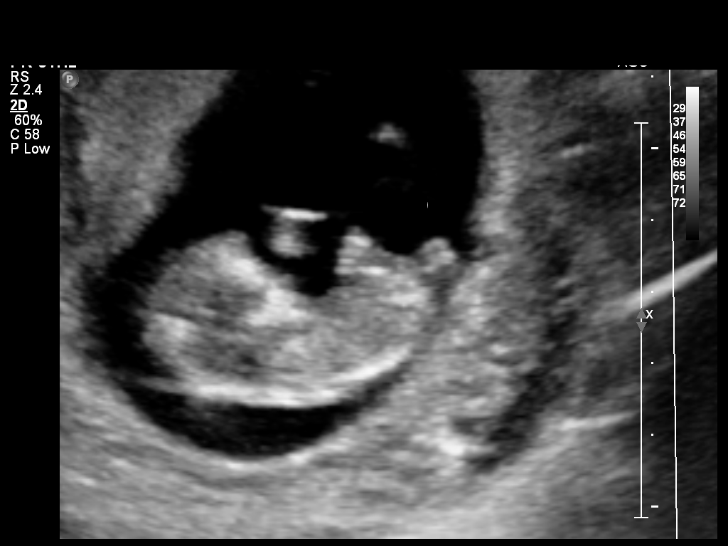
[im 8/44]
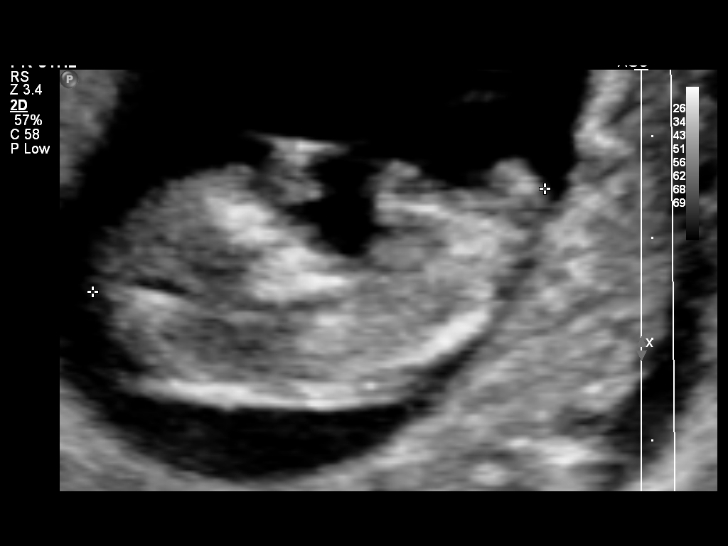
[im 12/44]
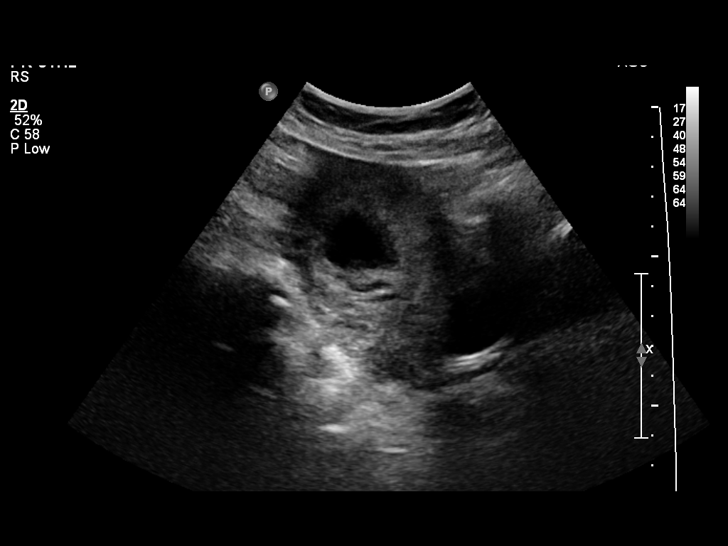
[im 15/44]
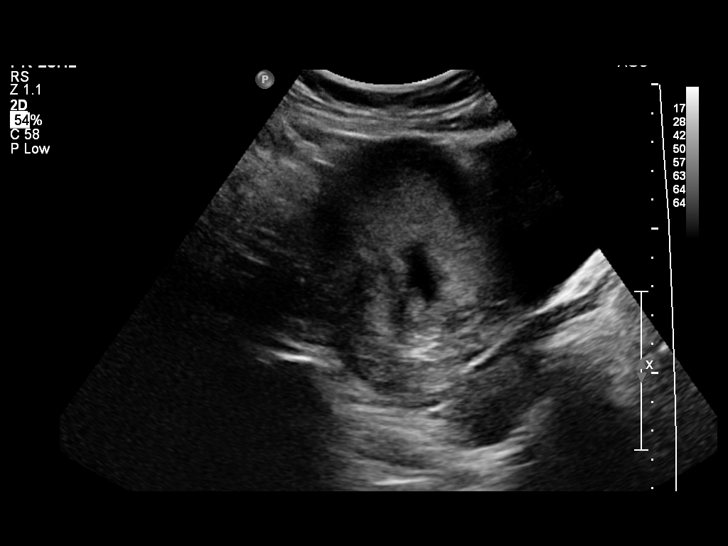
[im 18/44]
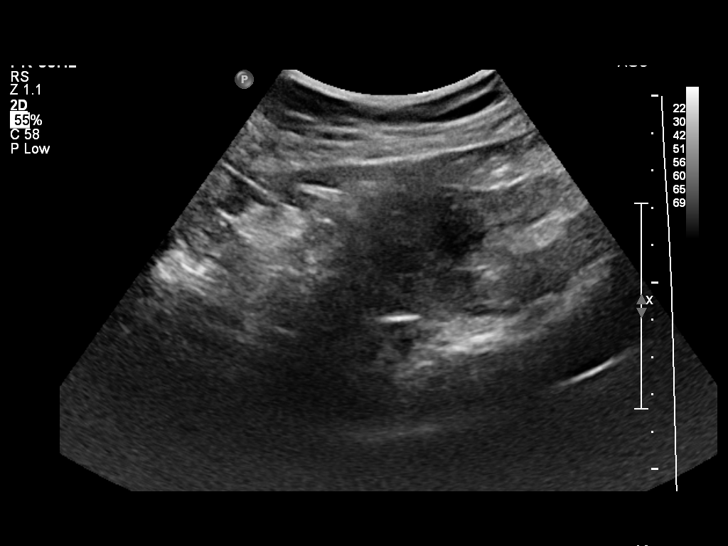
[im 21/44]
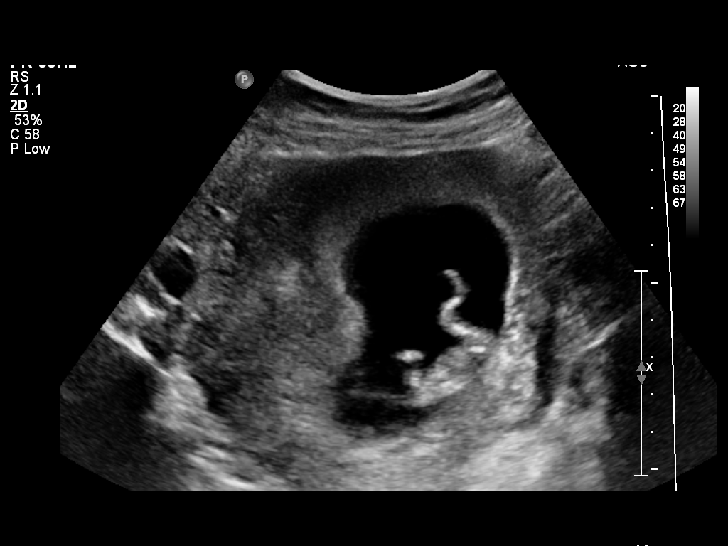
[im 24/44]
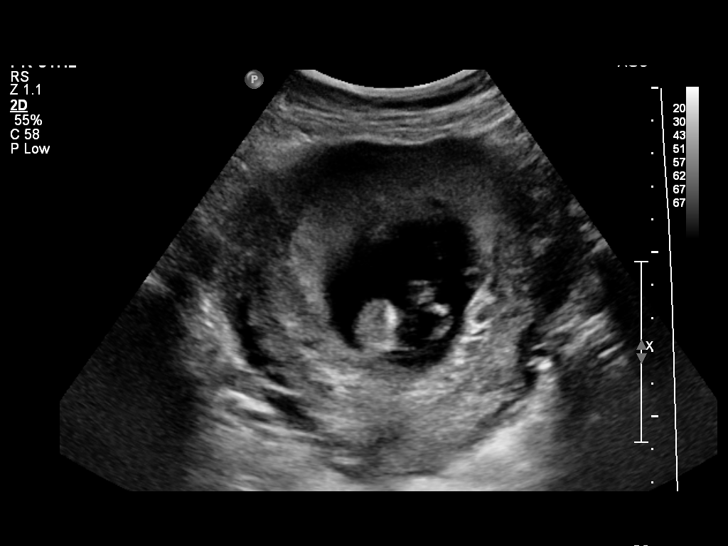
[im 28/44]
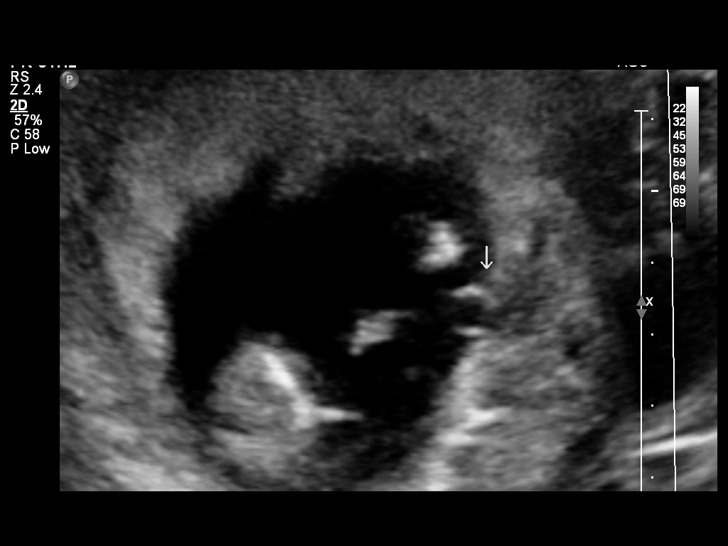
[im 31/44]
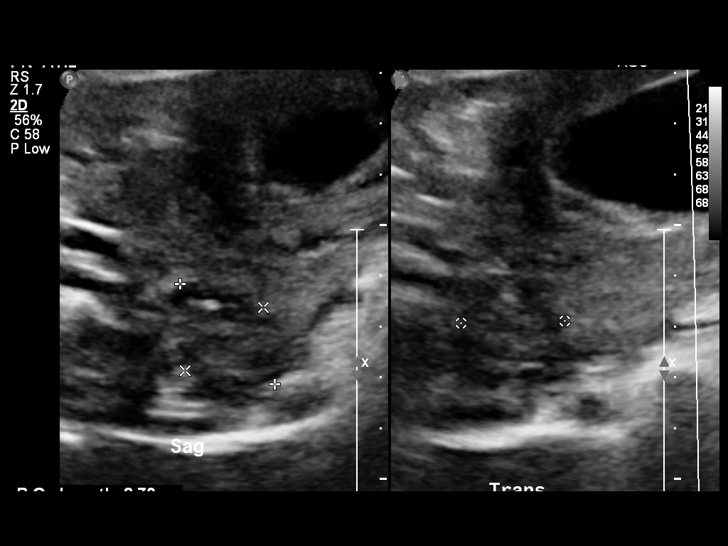
[im 34/44]
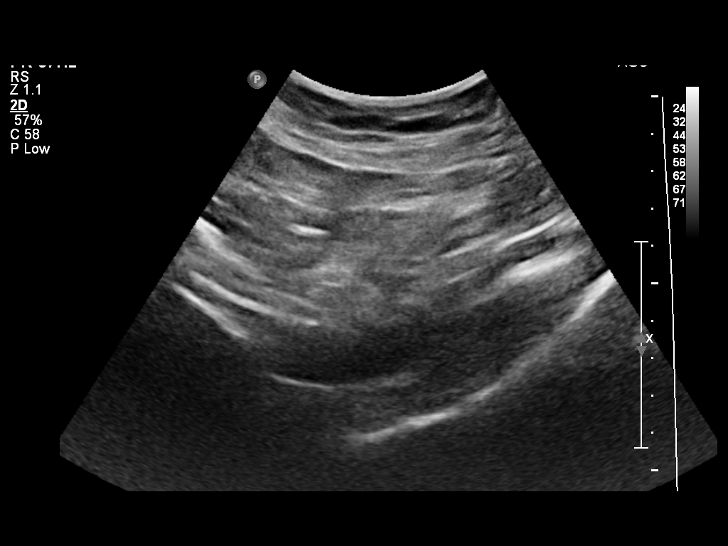
[im 37/44]
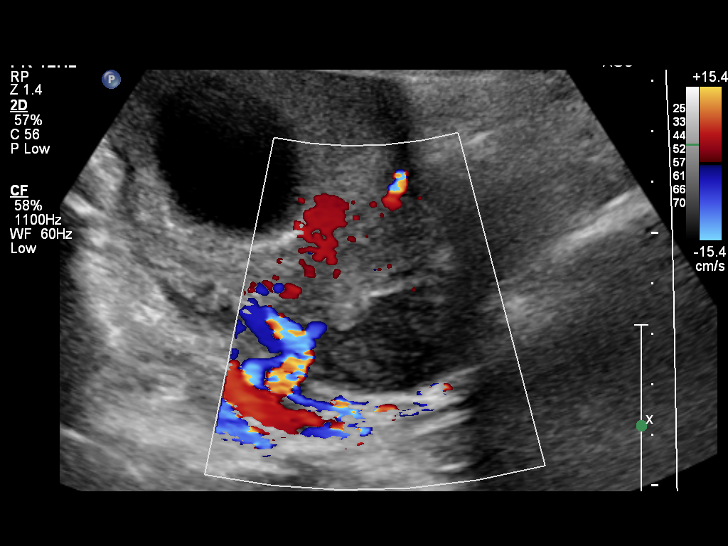
[im 40/44]
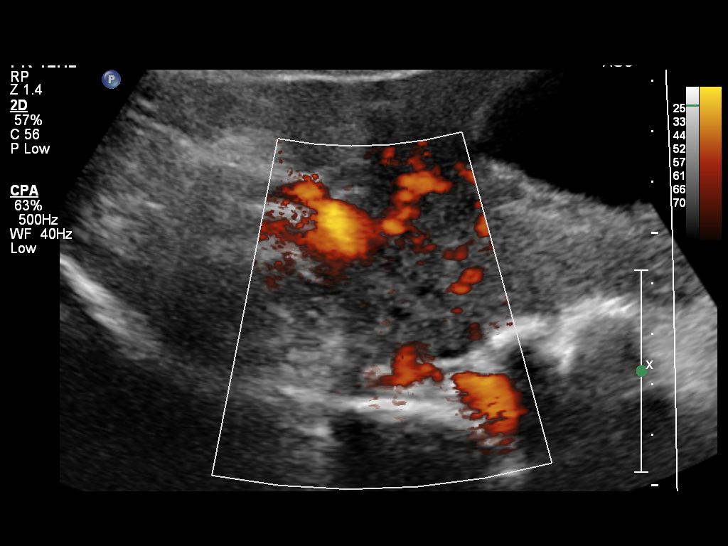
[im 44/44]
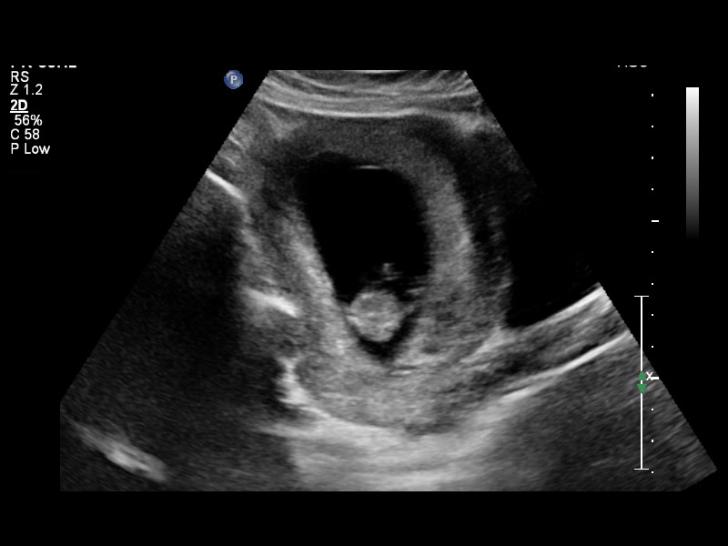

[14 of 28 positions shown; findings below may reference images not displayed]

FINDINGS: Intrauterine gestational sac: Visualized/normal in shape.

Yolk sac:  Visualized

Embryo:  Visualized

Cardiac Activity: Visualized

Heart Rate: 172 bpm

CRL:   42  mm   11 w 3d                  US EDC: 08/08/14

Maternal uterus/adnexae: Ovaries are normal. Small subchorionic
hematoma.
IMPRESSION: Single live intrauterine gestation measuring 11 weeks 3 days
gestational age by today's exam crown-rump length.

## 2016-05-22 ENCOUNTER — Ambulatory Visit (INDEPENDENT_AMBULATORY_CARE_PROVIDER_SITE_OTHER): Payer: Medicaid Other | Admitting: Internal Medicine

## 2016-05-22 ENCOUNTER — Encounter: Payer: Self-pay | Admitting: Internal Medicine

## 2016-05-22 VITALS — BP 118/72 | HR 79 | Ht 65.5 in | Wt 153.0 lb

## 2016-05-22 DIAGNOSIS — E1065 Type 1 diabetes mellitus with hyperglycemia: Secondary | ICD-10-CM | POA: Diagnosis not present

## 2016-05-22 MED ORDER — ACCU-CHEK SOFTCLIX LANCETS MISC: 3 refills | Status: DC

## 2016-05-22 MED ORDER — GLUCOSE BLOOD VI STRP: ORAL_STRIP | 3 refills | Status: DC

## 2016-05-22 NOTE — Progress Notes (Signed)
Patient ID: Theresa Koch, female   DOB: 28-Jul-1980, 36 y.o.   MRN: [id number]  HPI: Theresa Koch is a 36 y.o.-year-old female, initially referred by Dr. Synetta Shadow, returning for f/u for DM1, dx in 2011, but at DM1 in 02/2015, insulin-dependent since end 2013, uncontrolled, without complications. Last visit 2 mo ago. BCBS now.  She gave birth to a healthy baby girl 2 weeks ago - C -section (breach).  Sugars are low throughout the day >> 50-70s  >> has to drink something sweet throughout the day >> occas. Sugars >> 300s, 400s. She feels very poorly: fatigue, blurred vision, HAs. She is breast-feeding.  Today in the office: 50 >> given soda, crackers.   Last hemoglobin A1c was: Lab Results  Component Value Date   HGBA1C 6.0 03/10/2016   HGBA1C 5.6 12/14/2015   HGBA1C 7.2 09/14/2015   She is now on: - NPH 15 units in am and 5 units in pm - NovoLog 9-6-7 units with B-L-D  At last visit, she was on NPH and she was having the same problems as described above: Very low blood sugars, feeling very poorly so I suggested to switch to Lantus. However, she was switched back to NPH by her OB/GYN because this is the insulin mentioned in the guidelines. I do not agree with this in her particular case. She did wonderful on Lantus during her previous pregnancy and also since then. A review of the literature confirms the Lantus is much safer than NPH.  As a consequence of continuing on NPH, she remains hypoglycemic, fatigued, and overall miserable.  We stopped Metformin XR 500 mg 3x a day with meals. We stopped Glipizide XL 5 mg in am.  Pt checks her sugars 3-6x a day and they are: - am:   52-103, 117 >> 61-91, 98 >> 46-82, 110 >> 60-98 - 2h after b'fast:  76, 97-119, 135, 213 (forgot insulin) >> 53-110, 146 >> 68-162, 377 - before lunch: 64-105, 121, 140 >> 67-89 >> 52-68 >> 48-92 - 2h after lunch: 145-261, 280 >> 106-139, 183 x1 >> 40 x1, 83-151 >> 62-155, 174 - before dinner:  68, 71 >> 69-117,  164 (corr the 40) >> 63, 67, 433 - 2h after dinner:82, 287 >> 76-194 >> 68-129, 160 >> 95-133 >> 58-120 - bedtime: 126-222 >> 166 >> n/c >> 50-109 >> 58 >> 73 - nighttime:48x1 >> n/c >> 50-127 >> 41, 42 >> 58, 118 Lowest sugar was 40 >> 48; she has hypoglycemia awareness at 60.  Highest sugar was 164 >> 433  Glucometer: One Touch Ultra  Pt's meals are: - Breakfast: tea with milk + cookies - Lunch: salad, soup, cheese  - Dinner: egg, cheese, meat, sometimes cereals  - no CKD, no BUN/creatinine: Lab Results  Component Value Date   BUN 17 05/23/2015   Lab Results  Component Value Date   CREATININE 0.52 05/23/2015   - no h/o HL: Lab Results  Component Value Date   CHOL 137 05/23/2015   HDL 50.90 05/23/2015   LDLCALC 76 05/23/2015   TRIG 52.0 05/23/2015   CHOLHDL 3 05/23/2015   - last eye exam was on 03/2015. No DR.  - She denies numbness and tingling in her feet.  Last tsh: Lab Results  Component Value Date   TSH 1.11 05/23/2015   ROS: Constitutional: + weight loss after pregnancy, + fatigue, no subjective hyperthermia, no subjective hypothermia Eyes: + blurry vision, no xerophthalmia ENT: no sore throat, no nodules palpated in throat,  no dysphagia, no odynophagia, no hoarseness Cardiovascular: no CP/no SOB/no palpitations/no leg swelling Respiratory: no cough/no SOB/no wheezing Gastrointestinal: no N/no V/no D/no C/no acid reflux Musculoskeletal: no muscle aches/no joint aches Skin: no rashes, no hair loss Neurological: no tremors/no numbness/no tingling/no dizziness, + headaches  I reviewed pt's medications, allergies, PMH, social hx, family hx, and changes were documented in the history of present illness. Otherwise, unchanged from my initial visit note.  Past Medical History:  Diagnosis Date  . Diabetes mellitus without complication [facility])    Past Surgical History:  Procedure Laterality Date  . CESAREAN SECTION    . CESAREAN SECTION N/A 05/09/2016    Procedure: CESAREAN SECTION;  Surgeon: Bobbye Charleston, MD;  Location: [facility];  Service: Obstetrics;  Laterality: N/A;   History   Social History  . Marital Status: Married    Spouse Name: N/A    Number of Children: 1   Occupational History  . 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 Smoking status: Never Smoker   . Smokeless tobacco: Not on file  . Alcohol Use: No  . Drug Use: No   Current Outpatient Prescriptions on File Prior to Visit  Medication Sig Dispense Refill  . ACCU-CHEK AVIVA PLUS test strip USE TO TEST BLOOD SUGAR 6 TIMES DAILY AS INSTRUCTED. DX CODE: O24.912 200 each 3  . ACCU-CHEK SOFTCLIX LANCETS lancets USE TO TEST BLOOD SUGAR 6 TIMES DAILY AS INSTRUCTED. DX CODE: O24.912 200 each 3  . B-D INS SYRINGE 0.5CC/31GX5/16 31G X 5/16" 0.5 ML MISC USE 4 TIMES A DAY AS NEEDED 100 each 2  . BD PEN NEEDLE NANO U/F 32G X 4 MM MISC USE 5 TIMES DAILY 200 each 3  . Blood Glucose Monitoring Suppl (ACCU-CHEK AVIVA PLUS) W/DEVICE KIT Use to test blood sugar 6 times daily as instructed. Dx code: O24.912 1 kit 0  . oxyCODONE 10 MG TABS Take 1 tablet (10 mg total) by mouth every 4 (four) hours as needed (pain scale > 7). 30 tablet 0   No current facility-administered medications on file prior to visit.    Allergies  Allergen Reactions  . Penicillins Rash    Has patient had a PCN reaction causing immediate rash, facial/tongue/throat swelling, SOB or lightheadedness with hypotension: Yes Has patient had a PCN reaction causing severe rash involving mucus membranes or skin necrosis: No Has patient had a PCN reaction that required hospitalization No Has patient had a PCN reaction occurring within the last 10 years: No If all of the above answers are "NO", then may proceed with Cephalosporin use. Childhood allergy     Family History  Problem Relation Age of Onset  . Diabetes Father   . Kidney disease Father   . Diabetes Sister    . Diabetes Brother    PE: BP 118/72   Pulse 79   Ht 5' 5.5" (1.664 m)   Wt 153 lb (69.4 kg)   SpO2 97%   BMI 25.07 kg/m  Wt Readings from Last 3 Encounters:  05/22/16 153 lb (69.4 kg)  05/09/16 175 lb (79.4 kg)  03/20/16 159 lb (72.1 kg)   Constitutional: Normal weight, in NAD Eyes: PERRLA, EOMI, no exophthalmos ENT: moist mucous membranes, no thyromegaly, no cervical lymphadenopathy Cardiovascular: RRR, No MRG Respiratory: CTA B Gastrointestinal: abdomen soft, NT, ND, BS+ Musculoskeletal: no deformities, strength intact in all 4 Skin: moist, warm, no rashes Neurological: no tremor with outstretched hands, DTR normal  in all 4  ASSESSMENT: 1. DM1, uncontrolled, without long term complications, but with hypo-/hyperglycemia - now pregnant  We confirmed DM1: Component     Latest Ref Rng 02/09/2015  C-Peptide     0.80 - 3.90 ng/mL 0.24 (L)  Glucose, Fasting     65 - 99 mg/dL 64 (L)  Glutamic Acid Decarb Ab     <5 IU/mL 42 (H)  Pancreatic Islet Cell Antibody     < 5 JDF Units <5    PLAN:  1. Patient with fairly well controlled DM1 With low blood sugar after her duration as she continues on NPH. This was restarted before the end of her pregnancy by OB/GYN, despite our plan to resume Lantus due to persistent hypoglycemia and overall feeling poorly. At this visit, I strongly advised her started Lantus and also decreased the dose of NovoLog to avoid further lows. She will also stop sweet drinks, which are now needed throughout the day to maintain her blood sugars. - I suggested to:  Patient Instructions  Please stop NPH and start: - Lantus 6 units at bedtime - Novolog: 4-6 units before meals.  Please send me the sugar log in 2 weeks.  Please return in 3 months with your sugar log.   - We'll check annual labs at next visit. At this visit, we reviewed previous HbA1c levels, which were at goal. - continue checking sugars at different times of the day - check 3x a day,  rotating checks - advised for yearly eye exams >> she is due for another one - Return to clinic in 3 mo with sugar log    Philemon Kingdom, MD PhD [facility] Endocrin[facility]

## 2016-05-22 NOTE — Patient Instructions (Addendum)
Please stop NPH and start: - Lantus 6 units at bedtime - Novolog: 4-6 units before meals.  Please send me the sugar log in 2 weeks.  Please return in 3 months with your sugar log.

## 2016-07-09 IMAGING — US US OB DETAIL+14 WK
1 series · 12 of 28 positions shown · non-contrast
Comparison: none

OBSTETRICS REPORT
                      (Signed Final 03/16/2014 [DATE])

Service(s) Provided
 US OB DETAIL + 14 WK                                  76811.0
Indications
 Detailed fetal anatomic survey                        Z36
 Previous cesarean section
 Diabetes - Pregestational, 1st trimester insulin and
 metformin
 18 weeks gestation of pregnancy
Fetal Evaluation
 Num Of Fetuses:    1
 Fetal Heart Rate:  156                          bpm
 Cardiac Activity:  Observed
 Presentation:      Cephalic
 Placenta:          Anterior, above cervical os
 P. Cord            Visualized, central
 Insertion:
 Amniotic Fluid
 AFI FV:      Subjectively within normal limits
                                             Larg Pckt:     4.2  cm
Biometry
 BPD:     41.7  mm     G. Age:  18w 4d                CI:           70   70 - 86
                                                      FL/HC:      20.9   16.1 -
 HC:       159  mm     G. Age:  18w 6d       52  %    HC/AC:      1.14   1.09 -
 AC:     139.6  mm     G. Age:  19w 3d       72  %    FL/BPD:
 FL:      33.3  mm     G. Age:  20w 3d       94  %    FL/AC:      23.9   20 - 24
 HUM:     33.5  mm     G. Age:  21w 2d     > 95  %
 CER:       18  mm     G. Age:  18w 0d       30  %
 Est. FW:     308  gm    0 lb 11 oz      63  %
Gestational Age
 LMP:           18w 4d        Date:  11/06/13                 EDD:   08/13/14
 U/S Today:     19w 2d                                        EDD:   08/08/14
 Best:          18w 4d     Det. By:  LMP  (11/06/13)          EDD:   08/13/14
Anatomy
 Cranium:          Appears normal         Aortic Arch:      Not well visualized
 Fetal Cavum:      Appears normal         Ductal Arch:      Not well visualized
 Ventricles:       Appears normal         Diaphragm:        Appears normal
 Choroid Plexus:   Appears normal         Stomach:          Appears normal, left
                                                            sided
 Cerebellum:       Appears normal         Abdomen:          Appears normal
 Posterior Fossa:  Appears normal         Abdominal Wall:   Appears nml (cord
                                                            insert, abd wall)
 Nuchal Fold:      Not well visualized    Cord Vessels:     Appears normal (3
                                                            vessel cord)
 Face:             Appears normal         Kidneys:          Appear normal
                   (orbits and profile)
 Lips:             Appears normal         Bladder:          Appears normal
 Heart:            Not well visualized    Spine:            Appears normal
 RVOT:             Not well visualized    Lower             Appears normal
                                          Extremities:
 LVOT:             Appears normal         Upper             Appears normal
 Other:  Nasal bone visualized. Heels visualized. Female gender. Technically
         difficult due to fetal position.
Targeted Anatomy
 Fetal Central Nervous System
 Lat. Ventricles:  6.9                    Cisterna Magna:
Cervix Uterus Adnexa
 Cervical Length:    3.7      cm
 Cervix:       Normal appearance by transabdominal scan.
 Left Ovary:    Not visualized.
 Right Ovary:   Within normal limits.
Impression
INDICATION: 34 yr old [id number] at 85w6d with diabetes for fetal
 anatomic survey. Remote read.

[Series 1: us ob detail +14 wk · 101 acquisitions, 12 frames shown]
[im 4/101]
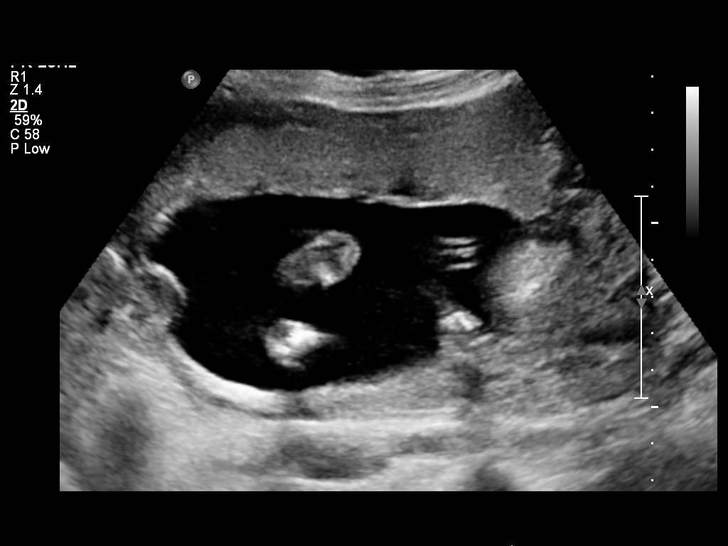
[im 12/101]
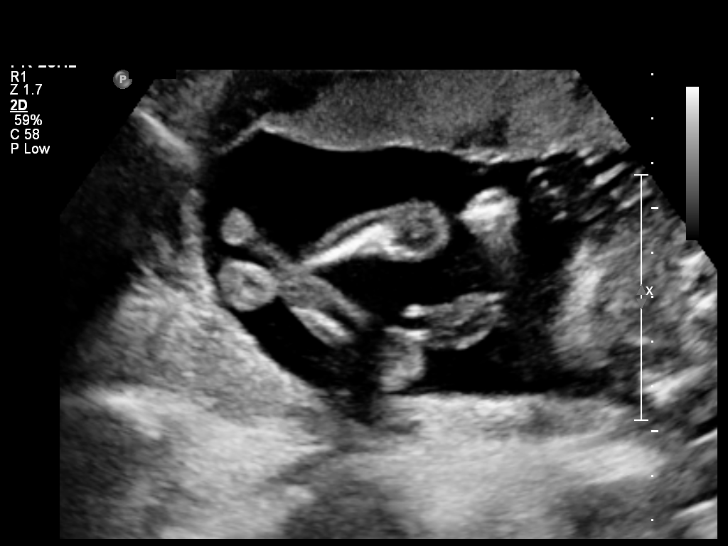
[im 19/101]
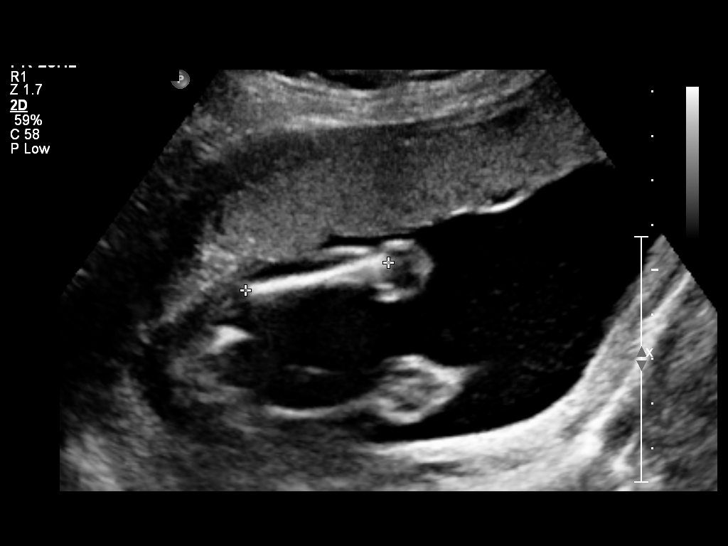
[im 30/101]
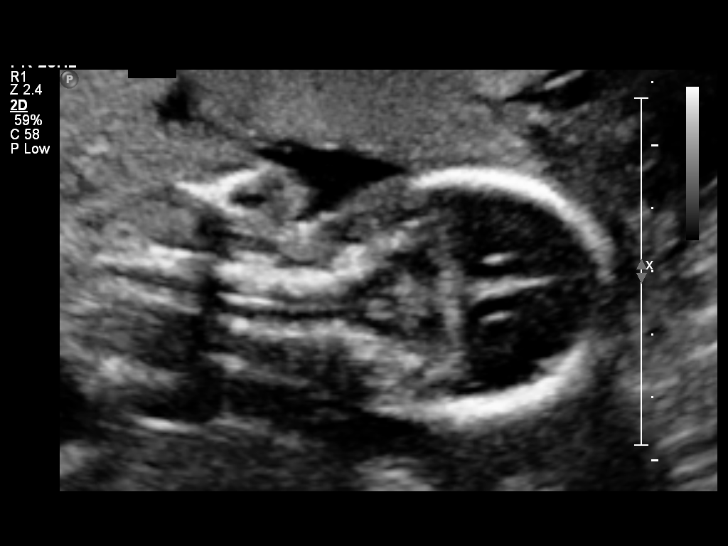
[im 38/101]
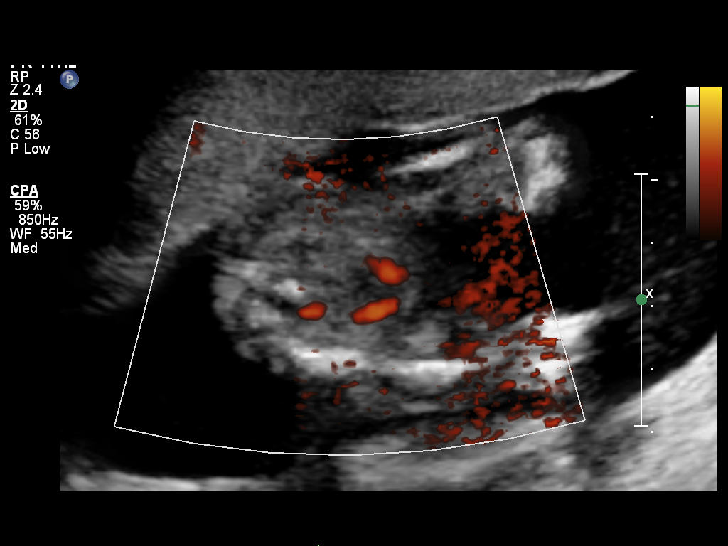
[im 45/101]
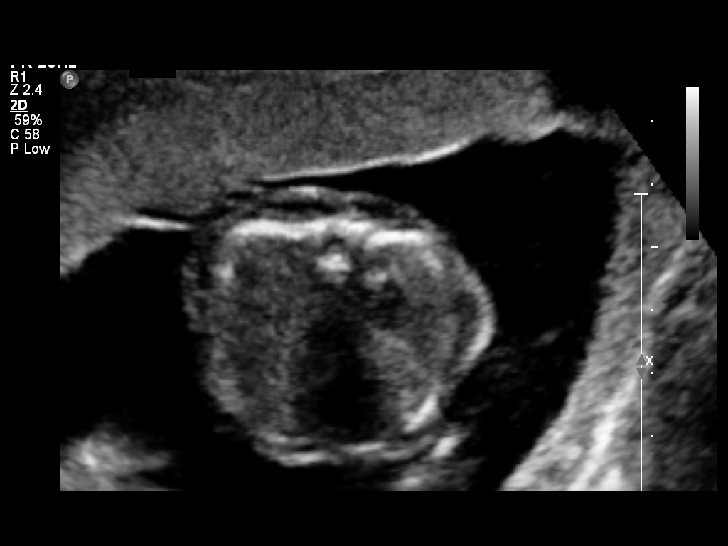
[im 56/101]
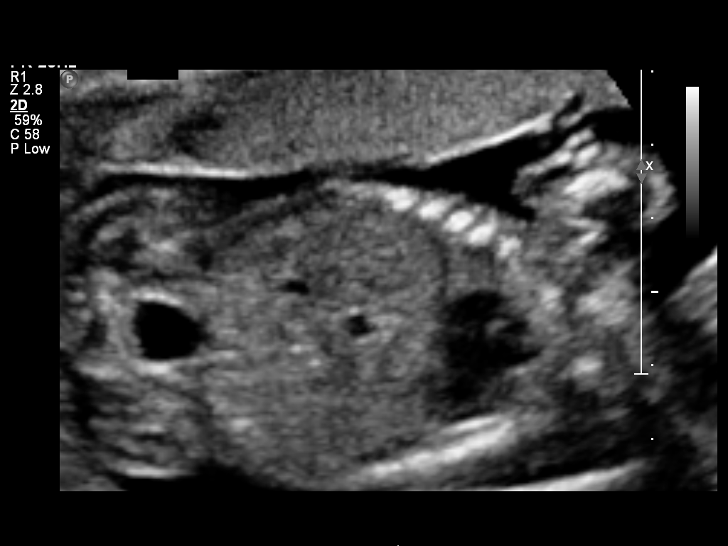
[im 63/101]
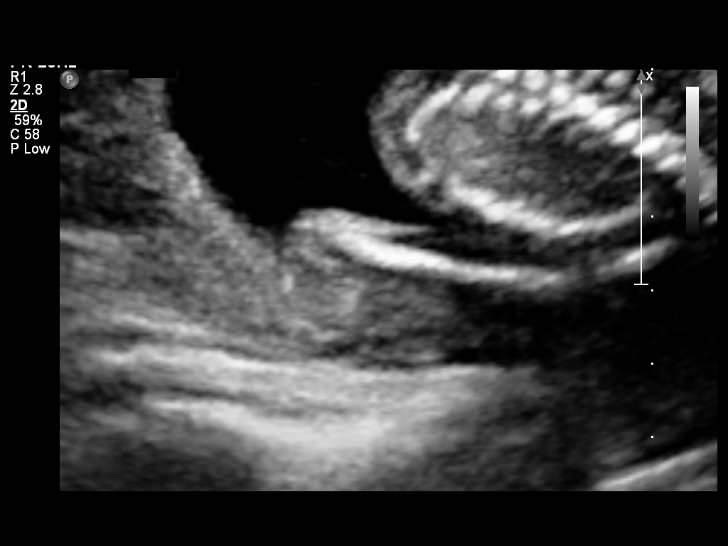
[im 71/101]
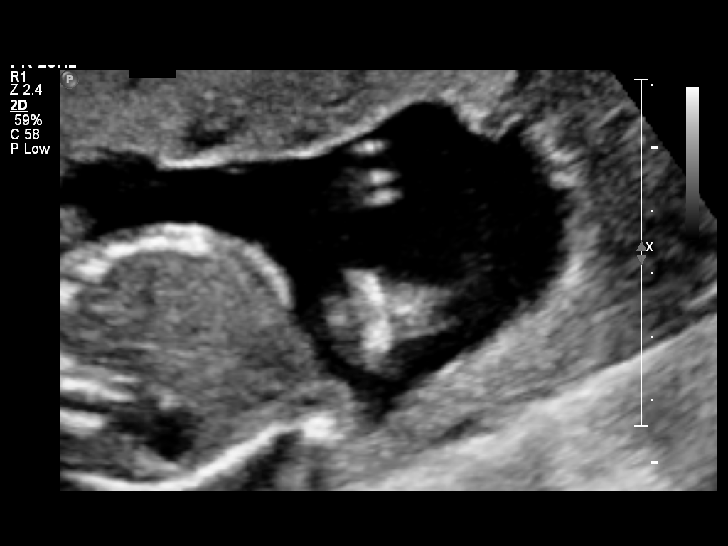
[im 82/101]
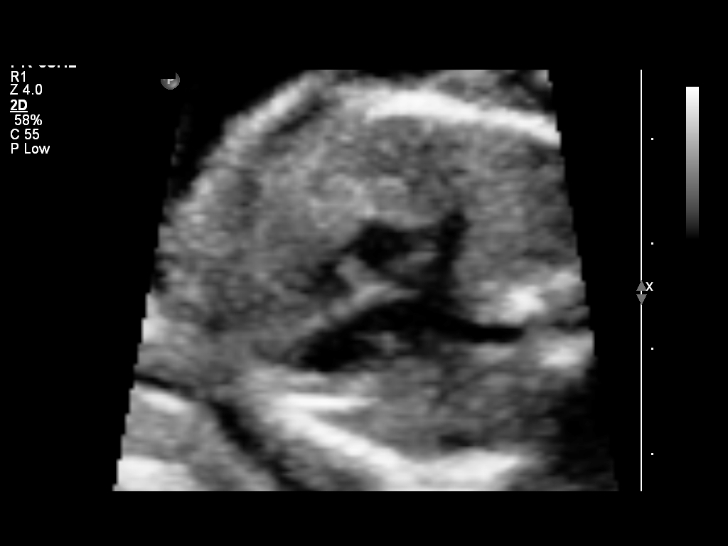
[im 89/101]
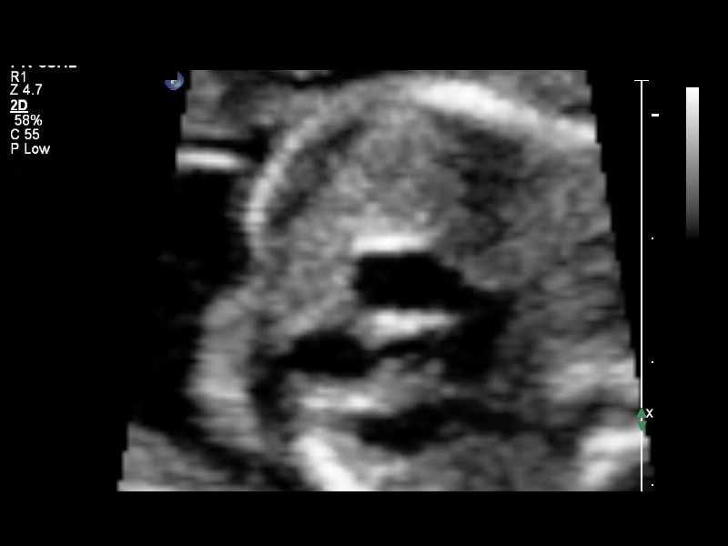
[im 97/101]
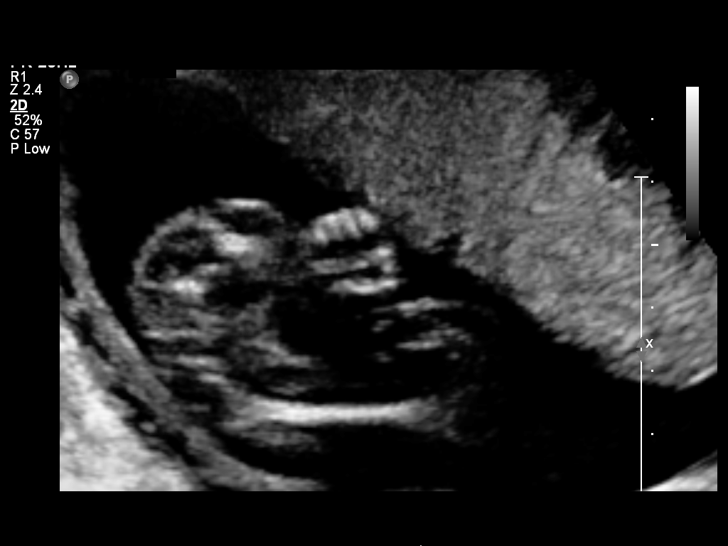

[12 of 28 positions shown; findings below may reference images not displayed]

FINDINGS: 1. Single intrauterine pregnancy.
 2. Fetal biometry is consistent with dating.
 3. Anterior placenta without evidence of previa.
 4. Normal amniotic fluid volume.
 5. Normal transabdominal cervical length.
 6. The views of the heart are limited.
 7. The remainder of the limited anatomy survey is normal.
Recommendations

 1. Appropriate fetal growth.
 2. Limited anatomy survey:
 - recommend follow up in 4 weeks to complete anatomic
 survey
 3. Diabetes:
 - recommend strict glucose control
 - recommend fetal echocardiogram
 - recommend serial fetal growth
 - recommend antenatal testing starting at 32 weeks

 Thank you for sharing in the care of Ms. ABDUL MUHAIMIN LEKHA with
 questions or concerns.

## 2016-09-05 ENCOUNTER — Ambulatory Visit: Payer: Medicaid Other | Admitting: Internal Medicine

## 2016-09-19 ENCOUNTER — Encounter: Payer: Self-pay | Admitting: Internal Medicine

## 2016-10-30 ENCOUNTER — Ambulatory Visit: Payer: Medicaid Other | Admitting: Internal Medicine

## 2016-12-26 ENCOUNTER — Telehealth: Payer: Self-pay | Admitting: Internal Medicine

## 2016-12-26 DIAGNOSIS — O24913 Unspecified diabetes mellitus in pregnancy, third trimester: Secondary | ICD-10-CM

## 2016-12-26 MED ORDER — "INSULIN SYRINGE-NEEDLE U-100 31G X 5/16"" 0.5 ML MISC": 2 refills | Status: DC

## 2016-12-26 NOTE — Telephone Encounter (Signed)
Sent to pharmacy 

## 2016-12-26 NOTE — Telephone Encounter (Signed)
Patient needs script for insulin syringes (cannot afford pen needles right now) sent to [facility] on [address] asap

## 2017-03-18 ENCOUNTER — Other Ambulatory Visit: Payer: Self-pay | Admitting: Internal Medicine

## 2017-03-18 ENCOUNTER — Telehealth: Payer: Self-pay | Admitting: Internal Medicine

## 2017-03-18 DIAGNOSIS — O24913 Unspecified diabetes mellitus in pregnancy, third trimester: Secondary | ICD-10-CM

## 2017-03-18 MED ORDER — "INSULIN SYRINGE-NEEDLE U-100 31G X 5/16"" 0.5 ML MISC": 1 refills | Status: DC

## 2017-03-18 NOTE — Telephone Encounter (Signed)
Called pt to inform Rx sent. No answer.  

## 2017-03-18 NOTE — Telephone Encounter (Signed)
Need refill of Insulin Aspart (NOVOLOG FLEXPEN [place]) [[id number]][[id number]] pin needles  [facility] to [id number]- [place], [place] - [address] [place] #:  [id number]

## 2017-03-24 ENCOUNTER — Other Ambulatory Visit: Payer: Self-pay

## 2017-03-24 MED ORDER — INSULIN ASPART 100 UNIT/ML FLEXPEN: 3.0000 [IU] | PEN_INJECTOR | Freq: Three times a day (TID) | SUBCUTANEOUS | 1 refills | Status: DC

## 2017-04-10 ENCOUNTER — Ambulatory Visit: Payer: BLUE CROSS/BLUE SHIELD | Admitting: Internal Medicine

## 2017-04-10 ENCOUNTER — Encounter: Payer: Self-pay | Admitting: Internal Medicine

## 2017-04-10 VITALS — BP 102/68 | HR 63 | Ht 65.5 in | Wt 146.8 lb

## 2017-04-10 DIAGNOSIS — E1065 Type 1 diabetes mellitus with hyperglycemia: Secondary | ICD-10-CM

## 2017-04-10 LAB — POCT GLYCOSYLATED HEMOGLOBIN (HGB A1C): Hemoglobin A1C: 7.8

## 2017-04-10 LAB — COMPLETE METABOLIC PANEL WITH GFR
AG Ratio: 1.4 (calc) (ref 1.0–2.5)
ALBUMIN MSPROF: 4 g/dL (ref 3.6–5.1)
ALKALINE PHOSPHATASE (APISO): 58 U/L (ref 33–115)
ALT: 10 U/L (ref 6–29)
AST: 16 U/L (ref 10–30)
BILIRUBIN TOTAL: 0.3 mg/dL (ref 0.2–1.2)
BUN: 18 mg/dL (ref 7–25)
CHLORIDE: 102 mmol/L (ref 98–110)
CO2: 31 mmol/L (ref 20–32)
Calcium: 9.1 mg/dL (ref 8.6–10.2)
Creat: 0.61 mg/dL (ref 0.50–1.10)
GFR, Est African American: 134 mL/min/{1.73_m2} (ref >=60)
GFR, Est Non African American: 116 mL/min/{1.73_m2} (ref >=60)
GLUCOSE: 166 mg/dL — AB (ref 65–99)
Globulin: 2.9 g/dL (calc) (ref 1.9–3.7)
Potassium: 4.2 mmol/L (ref 3.5–5.3)
Sodium: 137 mmol/L (ref 135–146)
Total Protein: 6.9 g/dL (ref 6.1–8.1)

## 2017-04-10 LAB — LIPID PANEL
CHOL/HDL RATIO: 2
Cholesterol: 137 mg/dL (ref 0–200)
HDL: 55 mg/dL (ref >39.00)
LDL Cholesterol: 73 mg/dL (ref 0–99)
NonHDL: 82.06
TRIGLYCERIDES: 45 mg/dL (ref 0.0–149.0)
VLDL: 9 mg/dL (ref 0.0–40.0)

## 2017-04-10 LAB — MICROALBUMIN / CREATININE URINE RATIO
CREATININE, U: 161.4 mg/dL
MICROALB/CREAT RATIO: 0.9 mg/g (ref 0.0–30.0)
Microalb, Ur: 1.5 mg/dL (ref 0.0–1.9)

## 2017-04-10 LAB — TSH: TSH: 1.19 u[IU]/mL (ref 0.35–4.50)

## 2017-04-10 MED ORDER — FREESTYLE LIBRE 14 DAY SENSOR MISC: 1.0000 | 11 refills | Status: DC

## 2017-04-10 MED ORDER — FREESTYLE LIBRE 14 DAY READER DEVI: 1.0000 | Freq: Once | 1 refills | Status: AC

## 2017-04-10 MED ORDER — INSULIN ASPART 100 UNIT/ML FLEXPEN: 5.0000 [IU] | PEN_INJECTOR | Freq: Three times a day (TID) | SUBCUTANEOUS | 11 refills | Status: DC

## 2017-04-10 NOTE — Progress Notes (Signed)
Patient ID: Theresa Koch, female   DOB: Jan 24, 1980, 37 y.o.   MRN: [id number]  HPI: Theresa Koch is a 37 y.o.-year-old female, initially referred by Dr. Synetta Shadow, returning for f/u for DM1, dx in 2011, but at DM1 in 02/2015, insulin-dependent since end 2013, uncontrolled, without complications. Last visit 1 year and 1 mo ago. BCBS now.  Last hemoglobin A1c was: Lab Results  Component Value Date   HGBA1C 6.0 03/10/2016   HGBA1C 5.6 12/14/2015   HGBA1C 7.2 09/14/2015   She was on: - NPH 15 units in am and 5 units in pm - NovoLog 9-6-7 units with B-L-D  She is now on: - Lantus 6 units at night - Novolog 5 units before meals We stopped Metformin XR 500 mg 3x a day with meals. We stopped Glipizide XL 5 mg in am.  Pt checks her sugars 3-6x day: - am:  61-91, 98 >> 46-82, 110 >> 60-98 >> 70-143 - 2h after b'fast: 53-110, 146 >> 68-162, 377 >> 123-288 - before lunch: 67-89 >> 52-68 >> 48-92 >> 88-250, 275 - 2h after lunch:40 x1, 83-151 >> 62-155, 174 >> 108-240, 334 - before dinner: 69-117, 164>> 63, 67, 433 >> 78, 105-225 - 2h after dinner: 95-133 >> 58-120 >> 100-213 - bedtime:50-109 >> 58 >> 73 >> n/c - nighttime:50-127 >> 41, 42 >> 58, 118 Lowest sugar was 40 >> 48 >>  70; she has hypoglycemia awareness at 60. Highest sugar was 164 >> 433 >> 334.  Glucometer: One Touch Ultra  Pt's meals are: - Breakfast: tea with milk + cookies - Lunch: salad, soup, cheese  - Dinner: egg, cheese, meat, sometimes cereals  - No CKD, no BUN/creatinine: Lab Results  Component Value Date   BUN 17 05/23/2015   Lab Results  Component Value Date   CREATININE 0.52 05/23/2015   - No HL: Lab Results  Component Value Date   CHOL 137 05/23/2015   HDL 50.90 05/23/2015   LDLCALC 76 05/23/2015   TRIG 52.0 05/23/2015   CHOLHDL 3 05/23/2015   - last eye exam was on 03/2017: No DR.  - no numbness and tingling in her feet.  Last TSH normal: Lab Results  Component Value Date   TSH 1.11  05/23/2015   ROS: Constitutional: no weight gain/no weight loss, no fatigue, no subjective hyperthermia, no subjective hypothermia Eyes: no blurry vision, no xerophthalmia ENT: no sore throat, no nodules palpated in throat, no dysphagia, no odynophagia, no hoarseness Cardiovascular: no CP/no SOB/no palpitations/no leg swelling Respiratory: no cough/no SOB/no wheezing Gastrointestinal: no N/no V/no D/no C/no acid reflux Musculoskeletal: no muscle aches/no joint aches Skin: no rashes, no hair loss Neurological: no tremors/no numbness/no tingling/no dizziness  I reviewed pt's medications, allergies, PMH, social hx, family hx, and changes were documented in the history of present illness. Otherwise, unchanged from my initial visit note.  Past Medical History:  Diagnosis Date  . Diabetes mellitus without complication [facility])    Past Surgical History:  Procedure Laterality Date  . CESAREAN SECTION    . CESAREAN SECTION N/A 05/09/2016   Procedure: CESAREAN SECTION;  Surgeon: Bobbye Charleston, MD;  Location: [place];  Service: Obstetrics;  Laterality: N/A;   History   Social History  . Marital Status: Married    Spouse Name: N/A    Number of Children: 1   Occupational History  . 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 Smoking status: Never Smoker   .  Smokeless tobacco: Not on file  . Alcohol Use: No  . Drug Use: No   Current Outpatient Medications on File Prior to Visit  Medication Sig Dispense Refill  . ACCU-CHEK SOFTCLIX LANCETS lancets USE TO TEST BLOOD SUGAR 6 TIMES DAILY AS INSTRUCTED. DX CODE: O24.912 200 each 3  . BD PEN NEEDLE NANO U/F 32G X 4 MM MISC USE 5 TIMES DAILY 200 each 3  . Blood Glucose Monitoring Suppl (ACCU-CHEK AVIVA PLUS) W/DEVICE KIT Use to test blood sugar 6 times daily as instructed. Dx code: O24.912 1 kit 0  . glucose blood (ACCU-CHEK AVIVA PLUS) test strip USE TO TEST BLOOD SUGAR 6 TIMES DAILY AS  INSTRUCTED. DX CODE: O24.912 200 each 3  . insulin aspart (NOVOLOG FLEXPEN) 100 UNIT/ML FlexPen Inject 3-5 Units into the skin 3 (three) times daily with meals. 30 pen 1  . insulin NPH Human (HUMULIN N,NOVOLIN N) 100 UNIT/ML injection Inject 55 Units into the skin.    . Insulin Syringe-Needle U-100 (B-D INS SYRINGE 0.5CC/31GX5/16) 31G X 5/16" 0.5 ML MISC USE TO CHECK BLOOD SUGARS 4 TIMES DAILY AS NEEDED 100 each 1   No current facility-administered medications on file prior to visit.    Allergies  Allergen Reactions  . Penicillins Rash    Has patient had a PCN reaction causing immediate rash, facial/tongue/throat swelling, SOB or lightheadedness with hypotension: Yes Has patient had a PCN reaction causing severe rash involving mucus membranes or skin necrosis: No Has patient had a PCN reaction that required hospitalization No Has patient had a PCN reaction occurring within the last 10 years: No If all of the above answers are "NO", then may proceed with Cephalosporin use. Childhood allergy     Family History  Problem Relation Age of Onset  . Diabetes Father   . Kidney disease Father   . Diabetes Sister   . Diabetes Brother    PE: BP 102/68   Pulse 63   Ht 5' 5.5" (1.664 m)   Wt 146 lb 12.8 oz (66.6 kg)   SpO2 99%   BMI 24.06 kg/m  Wt Readings from Last 3 Encounters:  04/10/17 146 lb 12.8 oz (66.6 kg)  05/22/16 153 lb (69.4 kg)  05/09/16 175 lb (79.4 kg)   Constitutional: normal weight, in NAD Eyes: PERRLA, EOMI, no exophthalmos ENT: moist mucous membranes, no thyromegaly, no cervical lymphadenopathy Cardiovascular: RRR, No MRG Respiratory: CTA B Gastrointestinal: abdomen soft, NT, ND, BS+ Musculoskeletal: no deformities, strength intact in all 4 Skin: moist, warm, no rashes Neurological: no tremor with outstretched hands, DTR normal in all 4  ASSESSMENT: 1. DM1, uncontrolled, without long term complications, but with hypo-/hyperglycemia - now pregnant  We confirmed  DM1: Component     Latest Ref Rng 02/09/2015  C-Peptide     0.80 - 3.90 ng/mL 0.24 (L)  Glucose, Fasting     65 - 99 mg/dL 64 (L)  Glutamic Acid Decarb Ab     <5 IU/mL 42 (H)  Pancreatic Islet Cell Antibody     < 5 JDF Units <5    PLAN:  1. Patient with previously fairly well-controlled diabetes type 1, with low blood sugars while on NPH during her pregnancy.  After she gave birth, she was lost for follow-up.  At last visit, we changed from NPH to Lantus and she continues on 6 units of Lantus at bedtime and 5 units of NovoLog before meals.  On this regimen, sugars are very variable, especially if she forgets to bolus  before a meal.  We discussed about the importance of doing so.  She normally boluses at the start of the meal, and we discussed about trying to use FiAsp insulin instead of NovoLog, as this is shorter acting and can be injected at the beginning of the meal, and not 10-15 minutes before.  She agrees to try this.  Given coupon card. - As sugars may be still high even after she boluses for that meal, I advised her to increase the NovoLog to 6 units with a larger meal, or if she has desired. - We also discussed about the freestyle libre CGM and I showed her an example of reports.  She would like to try it and I sent a prescription for the 14-day sensor to the pharmacy. - I suggested to:  Patient Instructions  Please continue: - Lantus 6 units at bedtime  Please change: - Novolog/FiAsp: 5 units before a regular meal 6 units before a larger meal  Try to inject Novolog/FiAsp before each meal.  Please return in 3 months with your sugar log.   - today, HbA1c is 7.8% - higher than before - continue checking sugars at different times of the day - check 4x a day, rotating checks - advised for yearly eye exams >> she is UTD - We will check annual labs today - Return to clinic in 3 mo with sugar log   - time spent with the patient: 25 minutes, of which >50% was spent in obtaining  information about her sugars, reviewing her previous labs, evaluations, and treatments, adjusting her medication type and doses and developing a plan to avoid further hyperglycemia or hypoglycemia  Component     Latest Ref Rng & Units 04/10/2017          Glucose     65 - 99 mg/dL 166 (H)  BUN     7 - 25 mg/dL 18  Creatinine     0.50 - 1.10 mg/dL 0.61  GFR, Est Non African American     > OR = 60 mL/min/1.18m 116  GFR, Est African American     > OR = 60 mL/min/1.752m134  BUN/Creatinine Ratio     6 - 22 (calc) NOT APPLICABLE  Sodium     13765 146 mmol/L 137  Potassium     3.5 - 5.3 mmol/L 4.2  Chloride     98 - 110 mmol/L 102  CO2     20 - 32 mmol/L 31  Calcium     8.6 - 10.2 mg/dL 9.1  Total Protein     6.1 - 8.1 g/dL 6.9  Albumin MSPROF     3.6 - 5.1 g/dL 4.0  Globulin     1.9 - 3.7 g/dL (calc) 2.9  AG Ratio     1.0 - 2.5 (calc) 1.4  Total Bilirubin     0.2 - 1.2 mg/dL 0.3  Alkaline phosphatase (APISO)     33 - 115 U/L 58  AST     10 - 30 U/L 16  ALT     6 - 29 U/L 10  Cholesterol     0 - 200 mg/dL 137  Triglycerides     0.0 - 149.0 mg/dL 45.0  HDL Cholesterol     >39.00 mg/dL 55.00  VLDL     0.0 - 40.0 mg/dL 9.0  LDL (calc)     0 - 99 mg/dL 73  Total CHOL/HDL Ratio      2  NonHDL  82.06  Microalb, Ur     0.0 - 1.9 mg/dL 1.5  Creatinine,U     mg/dL 161.4  MICROALB/CREAT RATIO     0.0 - 30.0 mg/g 0.9  Hemoglobin A1C      7.8  TSH     0.35 - 4.50 uIU/mL 1.19   Labs normal except  high glucose (nonfasting).  Philemon Kingdom, MD PhD [facility] Endocrinology

## 2017-04-10 NOTE — Patient Instructions (Addendum)
Please continue: - Lantus 6 units at bedtime  Please change: - Novolog/FiAsp: 5 units before a regular meal 6 units before a larger meal  Try to inject Novolog/FiAsp before each meal.  Please return in 3 months with your sugar log.

## 2017-05-21 ENCOUNTER — Other Ambulatory Visit: Payer: Self-pay | Admitting: Internal Medicine

## 2017-05-21 ENCOUNTER — Telehealth: Payer: Self-pay | Admitting: Internal Medicine

## 2017-05-21 DIAGNOSIS — E1065 Type 1 diabetes mellitus with hyperglycemia: Secondary | ICD-10-CM

## 2017-05-21 MED ORDER — INSULIN PEN NEEDLE 32G X 4 MM MISC: 3 refills | Status: DC

## 2017-05-21 NOTE — Telephone Encounter (Signed)
Rx sent. See meds.  

## 2017-05-21 NOTE — Telephone Encounter (Signed)
Patient needs new RX for Pen Needles sent to [facility] on [address]

## 2017-07-16 ENCOUNTER — Ambulatory Visit: Payer: BLUE CROSS/BLUE SHIELD | Admitting: Internal Medicine

## 2017-09-04 ENCOUNTER — Encounter: Payer: Self-pay | Admitting: Internal Medicine

## 2017-09-04 ENCOUNTER — Ambulatory Visit: Payer: BLUE CROSS/BLUE SHIELD | Admitting: Internal Medicine

## 2017-09-04 VITALS — BP 100/60 | HR 75 | Ht 65.5 in | Wt 151.4 lb

## 2017-09-04 DIAGNOSIS — E1065 Type 1 diabetes mellitus with hyperglycemia: Secondary | ICD-10-CM

## 2017-09-04 LAB — POCT GLYCOSYLATED HEMOGLOBIN (HGB A1C): HEMOGLOBIN A1C: 7.9 % — AB (ref 4.0–5.6)

## 2017-09-04 MED ORDER — INSULIN GLARGINE 100 UNIT/ML SOLOSTAR PEN: 6.0000 [IU] | PEN_INJECTOR | Freq: Every day | SUBCUTANEOUS | 11 refills | Status: DC

## 2017-09-04 MED ORDER — INSULIN ASPART 100 UNIT/ML FLEXPEN: 3.0000 [IU] | PEN_INJECTOR | Freq: Three times a day (TID) | SUBCUTANEOUS | 11 refills | Status: DC

## 2017-09-04 MED ORDER — FREESTYLE LIBRE 14 DAY SENSOR MISC: 1.0000 | 4 refills | Status: DC

## 2017-09-04 MED ORDER — INJECTION DEVICE FOR INSULIN DEVI: 11 refills | Status: DC

## 2017-09-04 MED ORDER — INSULIN ASPART 100 UNIT/ML CARTRIDGE (PENFILL): 3.0000 [IU] | Freq: Three times a day (TID) | SUBCUTANEOUS | 11 refills | Status: DC

## 2017-09-04 NOTE — Patient Instructions (Signed)
Please continue: - Lantus 6 units but move it to am  Continue: - Novolog:  3-4 units for a regular meal 5 units before a larger meal  Will try to see if we can give you a Novopen Echo.  Please return in 3 months with your sugar log.

## 2017-09-04 NOTE — Addendum Note (Signed)
Addended by: Yolande JollyLAWSON, Elwanda Moger on: 09/04/2017 02:46 PM   Modules accepted: Orders

## 2017-09-04 NOTE — Progress Notes (Signed)
Patient ID: Theresa Koch, female   DOB: 03-15-1980, 37 y.o.   MRN: [id number]  HPI: Theresa Koch is a 37 y.o.-year-old female, initially referred by Dr. Synetta Shadow, returning for f/u for DM1, dx in 2011, but at DM1 in 02/2015, insulin-dependent since end 2013, uncontrolled, without complications. Last visit 4.5 months ago. BCBS now.  Her sugars are more fluctuating now with more blood sugars as low as 40s, and also blood sugars higher than 250.  She feels more tired and has more headaches.  Last hemoglobin A1c was: Lab Results  Component Value Date   HGBA1C 7.8 04/10/2017   HGBA1C 6.0 03/10/2016   HGBA1C 5.6 12/14/2015   She is on: - Lantus 6 units at night - Novolog 3 or 5 units before meals.  We stopped Metformin XR 500 mg 3x a day with meals. We stopped Glipizide XL 5 mg in am.  Pt checks her sugars many times a day with her freestyle libre CGM: Freestyle Libre CGM parameters: - average: 145+/-66.4 - coefficient of variation: 45.8 (19-25%) - time in range:  - low (<70): 17% - normal (70-180): 52% - high (>180): 31%  CBGs 25% - 75%: - am:  46-82, 110 >> 60-98 >> 70-143 >> 40s, 50s, 60-150 - 2h after b'fast: 68-162, 377 >> 123-288 >> (milk and cookies/toast) 120- 210 - before lunch:48-92 >> 88-250, 275 >> (breakfast around 11 AM) 90-200 - 2h after lunch:62-155, 174 >> 108-240, 334 >> 100-190 - before dinner: 63, 67, 433 >> 78, 105-225 >> (actually lunchtime) 80-190 - 2h after dinner: 95-133 >> 58-120 >> 100-213 >> 50s, 160-220 - bedtime:50-109 >> 58 >> 73 >> n/c >> 40s, 80- 190 - nighttime:50-127 >> 41, 42 >> 58, 118 >> 100-190 Lowest sugar was 40 >> 48 >>  70 >> 40; she has hypoglycemia awareness at 60. Highest sugar was 164 >> 433 >> 334 >> 277 in the last 2 weeks  Glucometer: One Touch Ultra  Pt's meals are: - Breakfast: tea with milk + cookies - Lunch: salad, soup, cheese  - Dinner: egg, cheese, meat, sometimes cereals  -No CKD, no BUN/creatinine: Lab Results   Component Value Date   BUN 18 04/10/2017   Lab Results  Component Value Date   CREATININE 0.61 04/10/2017   -No HL: Lab Results  Component Value Date   CHOL 137 04/10/2017   HDL 55.00 04/10/2017   LDLCALC 73 04/10/2017   TRIG 45.0 04/10/2017   CHOLHDL 2 04/10/2017   - last eye exam was on 03/2017: No DR -No numbness and tingling in her feet.  Last TSH normal: Lab Results  Component Value Date   TSH 1.19 04/10/2017   Her daughter is 37 mo old.  ROS: Constitutional: no weight gain/no weight loss, + fatigue, no subjective hyperthermia, no subjective hypothermia Eyes: no blurry vision, no xerophthalmia ENT: no sore throat, no nodules palpated in throat, no dysphagia, no odynophagia, no hoarseness Cardiovascular: no CP/no SOB/no palpitations/no leg swelling Respiratory: no cough/no SOB/no wheezing Gastrointestinal: no N/no V/no D/no C/no acid reflux Musculoskeletal: no muscle aches/no joint aches, + pain in left big toe when she wear shoes, also pain in the left foot arch Skin: no rashes, no hair loss Neurological: no tremors/no numbness/no tingling/no dizziness, + headache  I reviewed pt's medications, allergies, PMH, social hx, family hx, and changes were documented in the history of present illness. Otherwise, unchanged from my initial visit note.  Past Medical History:  Diagnosis Date  . Diabetes mellitus without complication (  [facility])    Past Surgical History:  Procedure Laterality Date  . CESAREAN SECTION    . CESAREAN SECTION N/A 05/09/2016   Procedure: CESAREAN SECTION;  Surgeon: Bobbye Charleston, MD;  Location: [place];  Service: Obstetrics;  Laterality: N/A;   History   Social History  . Marital Status: Married    Spouse Name: N/A    Number of Children: 1   Occupational History  . 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 Smoking status: Never Smoker   . Smokeless tobacco: Not on file  . Alcohol Use: No   . Drug Use: No   Current Outpatient Medications on File Prior to Visit  Medication Sig Dispense Refill  . ACCU-CHEK SOFTCLIX LANCETS lancets USE TO TEST BLOOD SUGAR 6 TIMES DAILY AS INSTRUCTED. DX CODE: O24.912 200 each 3  . Blood Glucose Monitoring Suppl (ACCU-CHEK AVIVA PLUS) W/DEVICE KIT Use to test blood sugar 6 times daily as instructed. Dx code: W97.948 1 kit 0  . Continuous Blood Gluc Sensor (FREESTYLE LIBRE 14 DAY SENSOR) MISC 1 each by Does not apply route every 14 (fourteen) days. Change every 2 weeks 2 each 11  . glucose blood (ACCU-CHEK AVIVA PLUS) test strip USE TO TEST BLOOD SUGAR 6 TIMES DAILY AS INSTRUCTED. DX CODE: O24.912 200 each 3  . insulin aspart (FIASP FLEXTOUCH) 100 UNIT/ML FlexPen Inject 5-6 Units into the skin 3 (three) times daily with meals. 15 mL 11  . insulin NPH Human (HUMULIN N,NOVOLIN N) 100 UNIT/ML injection Inject 55 Units into the skin.    . Insulin Pen Needle (BD PEN NEEDLE NANO U/F) 32G X 4 MM MISC USE 5X A DAY 200 each 3  . Insulin Syringe-Needle U-100 (B-D INS SYRINGE 0.5CC/31GX5/16) 31G X 5/16" 0.5 ML MISC USE TO CHECK BLOOD SUGARS 4 TIMES DAILY AS NEEDED 100 each 1   No current facility-administered medications on file prior to visit.    Allergies  Allergen Reactions  . Penicillins Rash    Has patient had a PCN reaction causing immediate rash, facial/tongue/throat swelling, SOB or lightheadedness with hypotension: Yes Has patient had a PCN reaction causing severe rash involving mucus membranes or skin necrosis: No Has patient had a PCN reaction that required hospitalization No Has patient had a PCN reaction occurring within the last 10 years: No If all of the above answers are "NO", then may proceed with Cephalosporin use. Childhood allergy     Family History  Problem Relation Age of Onset  . Diabetes Father   . Kidney disease Father   . Diabetes Sister   . Diabetes Brother    PE: BP 100/60   Pulse 75   Ht 5' 5.5" (1.664 m)   Wt 151 lb  6.4 oz (68.7 kg)   SpO2 98%   Breastfeeding? Yes   BMI 24.81 kg/m  Wt Readings from Last 3 Encounters:  09/04/17 151 lb 6.4 oz (68.7 kg)  04/10/17 146 lb 12.8 oz (66.6 kg)  05/22/16 153 lb (69.4 kg)   Constitutional: Normal weight, in NAD Eyes: PERRLA, EOMI, no exophthalmos ENT: moist mucous membranes, no thyromegaly, no cervical lymphadenopathy Cardiovascular: RRR, No MRG Respiratory: CTA B Gastrointestinal: abdomen soft, NT, ND, BS+ Musculoskeletal: no deformities, strength intact in all 4 Skin: moist, warm, no rashes Neurological: no tremor with outstretched hands, DTR normal in all 4  ASSESSMENT: 1. DM1, uncontrolled, without long term complications, but with hypo-/hyperglycemia - now pregnant  We  confirmed DM1: Component     Latest Ref Rng 02/09/2015  C-Peptide     0.80 - 3.90 ng/mL 0.24 (L)  Glucose, Fasting     65 - 99 mg/dL 64 (L)  Glutamic Acid Decarb Ab     <5 IU/mL 42 (H)  Pancreatic Islet Cell Antibody     < 5 JDF Units <5    PLAN:  1. Patient with previously fairly well-controlled diabetes type 1, with low blood sugars while on NPH during her pregnancy.  After she gave birth, she was lost for follow-up.  We changed her from NPH to Lantus and that last visit, sugars were still quite variable, especially if she forgot to bolus before a meal.  We discussed about the importance of doing so.  She was bolusing at the start of the meal and we discussed about trying to use FiAsp insulin instead of NovoLog, for the shorter onset of action.  I gave her a coupon card for this.  At that time, we also increase the NovoLog dose with a larger meal to 6 units.  I also recommended a freestyle libre CGM and sent a prescription to her pharmacy. - She could not get the Fiasp due to misunderstanding with the insurance.  She feels that this may be resolved now so I sent another prescription to her pharmacy.  However, she has many lows and she tried to back off on her insulin dose with  meals, her sugars increase postprandially.  Therefore, I believe that she would benefit from injecting half units so we discussed about the use of the Novo pen echo which allows for half unit dosing.  I gave her a prescription for this and the NovoLog cartridges but first I would like to check with the diabetes educator if we have an open echo device to give her.  If the cartridges are expensive, we can continue with Fiasp, which I advised her to take at the start of the meal rather than 15 minutes before -In the meantime, since her sugars are very low in the morning and due to this she cannot bolus more than 3 units with her first meal of the day which leads to hyperglycemia, we discussed about moving Lantus in the morning.  I also advised her to try to use 4 units of NovoLog/Fiasp for regular meal and only go to 5 units for a larger meal. - I suggested to:  Patient Instructions  Please continue: - Lantus 6 units but move it to am  Continue: - Novolog:  3-4 units for a regular meal 5 units before a larger meal  Will try to see if we can give you a Novopen Echo.  Please return in 3 months with your sugar log.   - today, HbA1c is 7.9% (higher) - continue checking sugars at different times of the day - check 3-4x a day, rotating checks - advised for yearly eye exams >> she is UTD - Return to clinic in 3 mo with sugar log   - time spent with the patient: 25 minutes, of which >50% was spent in obtaining information about her symptoms, reviewing her previous labs, CBG levels at home, labs, and insulin regimen, counseling her about her diabetes, and developing a plan to further avoid hyper and hypoglycemia.  Philemon Kingdom, MD PhD [facility] Endocrinology

## 2017-09-08 ENCOUNTER — Encounter: Payer: Self-pay | Admitting: Internal Medicine

## 2017-10-15 ENCOUNTER — Other Ambulatory Visit: Payer: Self-pay | Admitting: Internal Medicine

## 2017-10-15 ENCOUNTER — Telehealth: Payer: Self-pay

## 2017-10-15 MED ORDER — INSULIN ASPART 100 UNIT/ML CARTRIDGE (PENFILL): 3.0000 [IU] | Freq: Three times a day (TID) | SUBCUTANEOUS | 11 refills | Status: DC

## 2017-10-15 NOTE — Telephone Encounter (Signed)
OK to give this to her

## 2017-10-15 NOTE — Telephone Encounter (Signed)
I already sent them, but may need to be resent.  They are already on her medication list, just reorder those.

## 2017-10-15 NOTE — Telephone Encounter (Signed)
Pt called back and said the pharmacy has not received the prescription for the cartridges. Please resend

## 2017-10-15 NOTE — Telephone Encounter (Signed)
Not sure how to order this

## 2017-10-15 NOTE — Telephone Encounter (Signed)
RX sent

## 2017-10-15 NOTE — Telephone Encounter (Signed)
Patient called to let us know she will come get the Echo pen.  Routing to Dr. Elvera Lennox, I am unable to locate this in the clinic, please advise.

## 2017-12-14 ENCOUNTER — Ambulatory Visit: Payer: BLUE CROSS/BLUE SHIELD | Admitting: Internal Medicine

## 2018-03-22 ENCOUNTER — Other Ambulatory Visit: Payer: Self-pay

## 2018-03-22 ENCOUNTER — Encounter: Payer: Self-pay | Admitting: Internal Medicine

## 2018-03-22 ENCOUNTER — Ambulatory Visit: Payer: BLUE CROSS/BLUE SHIELD | Admitting: Internal Medicine

## 2018-03-22 VITALS — BP 118/70 | HR 66 | Temp 97.8°F | Ht 65.5 in | Wt 148.0 lb

## 2018-03-22 DIAGNOSIS — E1065 Type 1 diabetes mellitus with hyperglycemia: Secondary | ICD-10-CM

## 2018-03-22 LAB — POCT GLYCOSYLATED HEMOGLOBIN (HGB A1C): HEMOGLOBIN A1C: 8.8 % — AB (ref 4.0–5.6)

## 2018-03-22 MED ORDER — INSULIN ASPART 100 UNIT/ML CARTRIDGE (PENFILL): 6.0000 [IU] | Freq: Three times a day (TID) | SUBCUTANEOUS | 11 refills | Status: DC

## 2018-03-22 MED ORDER — INSULIN GLARGINE 100 UNIT/ML SOLOSTAR PEN: 8.0000 [IU] | PEN_INJECTOR | Freq: Every day | SUBCUTANEOUS | 11 refills | Status: DC

## 2018-03-22 MED ORDER — GLUCOSE BLOOD VI STRP: ORAL_STRIP | 5 refills | Status: DC

## 2018-03-22 MED ORDER — FREESTYLE LIBRE 14 DAY SENSOR MISC: 1.0000 | 4 refills | Status: DC

## 2018-03-22 NOTE — Progress Notes (Signed)
Patient ID: Theresa Koch, female   DOB: 1980/10/01, 38 y.o.   MRN: [id number]  HPI: Theresa Koch is a 38 y.o.-year-old female, initially referred by Dr. Synetta Shadow, returning for f/u for DM1, dx in 2011, but at DM1 in 02/2015, insulin-dependent since 2013, uncontrolled, without long term complications. Last visit 6.5 months ago. BCBS now.  She had continuous URI >> was on ABx, steroids >> sugars high: 300-500 despite increasing insulin doses. Sugars started to improve only in the last 2 weeks and she was able to decrease the doses.  Last hemoglobin A1c was: Lab Results  Component Value Date   HGBA1C 7.9 (A) 09/04/2017   HGBA1C 7.8 04/10/2017   HGBA1C 6.0 03/10/2016   She is on: - Lantus 6 units at night >> moved to am >> 10 >> 8 units in am - Novolog 3-5 >> 8 >> 6 units before meals We stopped Metformin XR 500 mg 3x a day with meals. We stopped Glipizide XL 5 mg in am.  Pt.checks her sugars more than 4 times a day with his CGM.  Freestyle libre CGM parameters: - Average: 136 - % active CGM time: 10% of the time (02/2018) - Glucose variability 36.2% (target < or = to 36%) - time in range:  - very low (<54): 2% - low (54-69): 8% - normal range (70-180): 70% - high sugars (181-250): 19% - very high sugars (>250): 1%  CBGs in last 2 weeks: - am:  70-143 >> 40s, 50s, 60-150 >> 85-131 - 2h after b'fast: 123-288 >> 120- 210 >> 108-201, 218 (severe HA) - before lunch: 88-250, 275 >> 90-200 >> 75-125, 189 - 2h after lunch: 108-240, 334 >> 100-190 >> 99-167, 201 - before dinner:  78, 105-225 >> 80-190 >>135-200 - 2h after dinner: 100-213 >> 50s, 160-220 >> 140, 210 - bedtime:73 >> n/c >> 40s, 80- 190 >> n/c - nighttime: 41, 42 >> 58, 118 >> 100-190 >> n/c Lowest sugar was 40 >> 47 (Libre); she has hypoglycemia awareness in the 60s. Highest sugar was 277 >> 264.  Glucometer: One Touch Ultra  Pt's meals are: - Breakfast: tea with milk + cookies - Lunch: salad, soup, cheese  -  Dinner: egg, cheese, meat, sometimes cereals  -No CKD, no BUN/creatinine: Lab Results  Component Value Date   BUN 18 04/10/2017   Lab Results  Component Value Date   CREATININE 0.61 04/10/2017   -No HL: Lab Results  Component Value Date   CHOL 137 04/10/2017   HDL 55.00 04/10/2017   LDLCALC 73 04/10/2017   TRIG 45.0 04/10/2017   CHOLHDL 2 04/10/2017   - last eye exam was on 03/2017: No DR - no numbness and tingling in her feet.  Last TSH normal: Lab Results  Component Value Date   TSH 1.19 04/10/2017   ROS: Constitutional: no weight gain/no weight loss, + fatigue, no subjective hyperthermia, no subjective hypothermia Eyes: no blurry vision, no xerophthalmia ENT: no sore throat, no nodules palpated in neck, no dysphagia, no odynophagia, no hoarseness Cardiovascular: no CP/no SOB/no palpitations/no leg swelling Respiratory: + Cough and sneezing/no SOB/no wheezing Gastrointestinal: no N/no V/no D/no C/no acid reflux Musculoskeletal: no muscle aches/no joint aches Skin: no rashes, no hair loss Neurological: no tremors/no numbness/no tingling/no dizziness, + headache  I reviewed pt's medications, allergies, PMH, social hx, family hx, and changes were documented in the history of present illness. Otherwise, unchanged from my initial visit note.  Past Medical History:  Diagnosis Date  . Diabetes  mellitus without complication [facility])    Past Surgical History:  Procedure Laterality Date  . CESAREAN SECTION    . CESAREAN SECTION N/A 05/09/2016   Procedure: CESAREAN SECTION;  Surgeon: Bobbye Charleston, MD;  Location: [place];  Service: Obstetrics;  Laterality: N/A;   History   Social History  . Marital Status: Married    Spouse Name: N/A    Number of Children: 1   Occupational History  . 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 Smoking status: Never Smoker   . Smokeless tobacco: Not on file  . Alcohol Use: No  . Drug  Use: No   Current Outpatient Medications on File Prior to Visit  Medication Sig Dispense Refill  . ACCU-CHEK SOFTCLIX LANCETS lancets USE TO TEST BLOOD SUGAR 6 TIMES DAILY AS INSTRUCTED. DX CODE: O24.912 200 each 3  . Blood Glucose Monitoring Suppl (ACCU-CHEK AVIVA PLUS) W/DEVICE KIT Use to test blood sugar 6 times daily as instructed. Dx code: I94.854 1 kit 0  . Continuous Blood Gluc Sensor (FREESTYLE LIBRE 14 DAY SENSOR) MISC 1 each by Does not apply route every 14 (fourteen) days. Change every 2 weeks 6 each 4  . glucose blood (ACCU-CHEK AVIVA PLUS) test strip USE TO TEST BLOOD SUGAR 6 TIMES DAILY AS INSTRUCTED. DX CODE: O24.912 200 each 3  . injection device for insulin (NOVOPEN ECHO) DEVI Use 1 each 5 each 11  . insulin aspart (NOVOLOG) cartridge Inject 3-5 Units into the skin 3 (three) times daily with meals. 15 mL 11  . Insulin Glargine (LANTUS SOLOSTAR) 100 UNIT/ML Solostar Pen Inject 6 Units into the skin daily. 5 pen 11  . Insulin Pen Needle (BD PEN NEEDLE NANO U/F) 32G X 4 MM MISC USE 5X A DAY 200 each 3  . Insulin Syringe-Needle U-100 (B-D INS SYRINGE 0.5CC/31GX5/16) 31G X 5/16" 0.5 ML MISC USE TO CHECK BLOOD SUGARS 4 TIMES DAILY AS NEEDED 100 each 1   No current facility-administered medications on file prior to visit.    Allergies  Allergen Reactions  . Penicillins Rash    Has patient had a PCN reaction causing immediate rash, facial/tongue/throat swelling, SOB or lightheadedness with hypotension: Yes Has patient had a PCN reaction causing severe rash involving mucus membranes or skin necrosis: No Has patient had a PCN reaction that required hospitalization No Has patient had a PCN reaction occurring within the last 10 years: No If all of the above answers are "NO", then may proceed with Cephalosporin use. Childhood allergy     Family History  Problem Relation Age of Onset  . Diabetes Father   . Kidney disease Father   . Diabetes Sister   . Diabetes Brother    PE: BP  118/70   Pulse 66   Temp 97.8 F (36.6 C)   Ht 5' 5.5" (1.664 m) Comment: measured  Wt 148 lb (67.1 kg)   SpO2 99%   BMI 24.25 kg/m  Wt Readings from Last 3 Encounters:  03/22/18 148 lb (67.1 kg)  09/04/17 151 lb 6.4 oz (68.7 kg)  04/10/17 146 lb 12.8 oz (66.6 kg)   Constitutional: normal weight, in NAD Eyes: PERRLA, EOMI, no exophthalmos ENT: moist mucous membranes, no thyromegaly, no cervical lymphadenopathy Cardiovascular: RRR, No MRG Respiratory: CTA B Gastrointestinal: abdomen soft, NT, ND, BS+ Musculoskeletal: no deformities, strength intact in all 4 Skin: moist, warm, no rashes Neurological: no tremor with outstretched hands, DTR normal in all  4  ASSESSMENT: 1. DM1, uncontrolled, without long term complications, but with hypo-/hyperglycemia - now pregnant  We confirmed DM1: Component     Latest Ref Rng 02/09/2015  C-Peptide     0.80 - 3.90 ng/mL 0.24 (L)  Glucose, Fasting     65 - 99 mg/dL 64 (L)  Glutamic Acid Decarb Ab     <5 IU/mL 42 (H)  Pancreatic Islet Cell Antibody     < 5 JDF Units <5    PLAN:  1. Patient with history of variable blood sugars on a basal-bolus insulin regimen with very low insulin doses due to high insulin sensitivity.  At last visit I recommended to switch to NovoLog echo pen, which is able to give her half units.  At that time, we discussed about fine-tuning the NovoLog doses before meals.  She was able to get the cartridges for the pen and we gave her the actual pen.  At that time, we also moved Lantus in the morning as her sugars were very low in the morning.  At that time, HbA1c was higher, at 7.9% -She continues on the freestyle libre CGM but she has to use it for 2 weeks and then take a break for 2 weeks as the adhesive irritates her skin.  We reviewed the data from February from her CBG download and the data from March from her logs. -Sugars improved significantly after the beginning of March after she was able to come off steroids and  started to feel better.  She has had sugars as high as 500s while on prednisolone for her recurrent URI.  However, in the last 2 weeks, sugars are much better at all times of the day, with a exception of slightly higher blood sugars after dinner.  We discussed that she may need a higher dose of insulin before dinner, but only with certain meals- that contain more carbs.  At that time, I advised her to add 1 more unit.  Lantus dose appeared to be adequate at 8 units.  We will not change this.  She will continue to take it in the morning.  No significant low blood sugar since last visit except for a glucose of 47 checked by the libre CGM.  This appeared after the correction of a high blood sugar, but now, she did not have such high blood sugars and did not have the correct them between meals. - I suggested to:  Patient Instructions  Please continue: - Lantus 8 units in am  Please increase: - Novolog Echo Pen:  6 units for a regular meal 7 units before a larger meal  Please return in 3-4 months with your sugar log.   - today, HbA1c is 8.8% (higher) - continue checking sugars at different times of the day - check 3-4x a day, rotating checks - advised for yearly eye exams >> she is UTD - We discussed about establishing care with a primary care doctor.  If she does not have annual labs until next visit, we will check them then.  At today's visit, she is not feeling very well: Headache, sneezing, fatigue. - Return to clinic in 3-4 mo with sugar log    - time spent with the patient: 25 min, of which >50% was spent in reviewing her CGM downloads (we will scan these) and her sugar log, discussing her hypo- and hyper-glycemic episodes, reviewing previous labs and insulin doses and developing a plan to avoid hypo- and hyper-glycemia.   Philemon Kingdom, MD  PhD [facility] Endocrinology

## 2018-03-22 NOTE — Patient Instructions (Addendum)
Please continue: - Lantus 8 units in am  Please increase: - Novolog Echo Pen:  6 units for a regular meal 7 units before a larger meal  Please return in 3-4 months with your sugar log.

## 2018-03-22 NOTE — Addendum Note (Signed)
Addended by: CONGER, MELISSA I on: 03/22/2018 04:01 PM   Modules accepted: Orders  

## 2018-06-21 ENCOUNTER — Telehealth: Payer: Self-pay | Admitting: Internal Medicine

## 2018-06-21 ENCOUNTER — Other Ambulatory Visit: Payer: Self-pay | Admitting: Internal Medicine

## 2018-06-21 DIAGNOSIS — E1065 Type 1 diabetes mellitus with hyperglycemia: Secondary | ICD-10-CM

## 2018-06-21 NOTE — Telephone Encounter (Signed)
MEDICATION: Insulin Pen Needle (BD PEN NEEDLE NANO U/F) 32G X 4 MM MISC  PHARMACY:   [id number] - [place], [place] [address] (Phone) [phone] (Fax)    IS THIS A 90 DAY SUPPLY : Yes  IS PATIENT OUT OF MEDICATION: Yes   IF NOT; HOW MUCH IS LEFT: 0  LAST APPOINTMENT DATE: @6 /15/2020  NEXT APPOINTMENT DATE:@9 /10/2018  DO WE HAVE YOUR PERMISSION TO LEAVE A DETAILED MESSAGE: Yes  OTHER COMMENTS:    **Let patient know to contact pharmacy at the end of the day to make sure medication is ready. **  ** Please notify patient to allow 48-72 hours to process**  **Encourage patient to contact the pharmacy for refills or they can request refills through [facility]**

## 2018-06-21 NOTE — Telephone Encounter (Signed)
This has been sent

## 2018-08-13 ENCOUNTER — Other Ambulatory Visit: Payer: Self-pay

## 2018-08-13 ENCOUNTER — Telehealth: Payer: Self-pay | Admitting: Internal Medicine

## 2018-08-13 DIAGNOSIS — E1065 Type 1 diabetes mellitus with hyperglycemia: Secondary | ICD-10-CM

## 2018-08-13 MED ORDER — ACCU-CHEK AVIVA PLUS W/DEVICE KIT: PACK | 0 refills | Status: DC

## 2018-08-13 MED ORDER — ACCU-CHEK AVIVA PLUS VI STRP: ORAL_STRIP | 5 refills | Status: DC

## 2018-08-13 NOTE — Telephone Encounter (Signed)
Patient requests her preferred Pharmacy be  [facility] on [address]. All RX's from Dr. Cruzita Lederer should be sent to the pharmacy listed above.  Currently patient requests RX for :  MEDICATION: glucose blood (ACCU-CHEK AVIVA PLUS) test strip  PHARMACY:  [facility] on [address].  IS THIS A 90 DAY SUPPLY : not sure  IS PATIENT OUT OF MEDICATION: Yes  IF NOT; HOW MUCH IS LEFT: 0  LAST APPOINTMENT DATE: @6 /15/2020  NEXT APPOINTMENT DATE:@9 /10/2018  DO WE HAVE YOUR PERMISSION TO LEAVE A DETAILED MESSAGE: Yes  OTHER COMMENTS:    **Let patient know to contact pharmacy at the end of the day to make sure medication is ready. **  ** Please notify patient to allow 48-72 hours to process**  **Encourage patient to contact the pharmacy for refills or they can request refills through [facility]**

## 2018-08-13 NOTE — Telephone Encounter (Signed)
glucose blood (ACCU-CHEK AVIVA PLUS) test strip 200 each 5 08/13/2018    Sig: USE TO TEST BLOOD SUGAR 6 TIMES DAILY AS INSTRUCTED. DX CODE: O57.912   Sent to pharmacy as: glucose blood (ACCU-CHEK AVIVA PLUS) test strip   E-Prescribing Status: Receipt confirmed by pharmacy (08/13/2018 1:55 PM EDT)

## 2018-09-16 ENCOUNTER — Ambulatory Visit: Payer: BLUE CROSS/BLUE SHIELD | Admitting: Internal Medicine

## 2018-10-12 ENCOUNTER — Other Ambulatory Visit: Payer: Self-pay | Admitting: Internal Medicine

## 2018-10-13 MED ORDER — INSULIN ASPART 100 UNIT/ML CARTRIDGE (PENFILL): 6.0000 [IU] | Freq: Three times a day (TID) | SUBCUTANEOUS | 11 refills | Status: DC

## 2018-10-13 MED ORDER — FREESTYLE LIBRE 14 DAY SENSOR MISC: 1.0000 | 4 refills | Status: DC

## 2018-11-12 ENCOUNTER — Other Ambulatory Visit: Payer: Self-pay | Admitting: Internal Medicine

## 2018-11-15 ENCOUNTER — Telehealth: Payer: Self-pay

## 2018-11-15 ENCOUNTER — Other Ambulatory Visit: Payer: Self-pay

## 2018-11-15 MED ORDER — INSULIN ASPART 100 UNIT/ML CARTRIDGE (PENFILL): 6.0000 [IU] | Freq: Three times a day (TID) | SUBCUTANEOUS | 11 refills | Status: DC

## 2018-11-15 NOTE — Telephone Encounter (Signed)
The only refilled that was denied was the one she no longer takes.

## 2018-11-15 NOTE — Telephone Encounter (Signed)
Patient called in wanting to know why refill was denied please give patient a call back

## 2018-11-18 ENCOUNTER — Other Ambulatory Visit: Payer: Self-pay | Admitting: Internal Medicine

## 2018-12-17 ENCOUNTER — Ambulatory Visit: Payer: BLUE CROSS/BLUE SHIELD | Admitting: Internal Medicine

## 2019-01-09 ENCOUNTER — Other Ambulatory Visit: Payer: Self-pay | Admitting: Internal Medicine

## 2019-01-09 DIAGNOSIS — E1065 Type 1 diabetes mellitus with hyperglycemia: Secondary | ICD-10-CM

## 2019-01-11 ENCOUNTER — Encounter: Payer: Self-pay | Admitting: Internal Medicine

## 2019-01-11 ENCOUNTER — Other Ambulatory Visit: Payer: Self-pay

## 2019-01-11 ENCOUNTER — Ambulatory Visit (INDEPENDENT_AMBULATORY_CARE_PROVIDER_SITE_OTHER): Payer: 59 | Admitting: Internal Medicine

## 2019-01-11 VITALS — BP 120/72 | HR 74 | Ht 65.5 in | Wt 153.0 lb

## 2019-01-11 DIAGNOSIS — E1065 Type 1 diabetes mellitus with hyperglycemia: Secondary | ICD-10-CM | POA: Diagnosis not present

## 2019-01-11 LAB — POCT GLYCOSYLATED HEMOGLOBIN (HGB A1C): Hemoglobin A1C: 8 % — AB (ref 4.0–5.6)

## 2019-01-11 LAB — LIPID PANEL
Cholesterol: 173 mg/dL (ref 0–200)
HDL: 64 mg/dL (ref >39.00)
LDL Cholesterol: 100 mg/dL — ABNORMAL HIGH (ref 0–99)
NonHDL: 109.04
Total CHOL/HDL Ratio: 3
Triglycerides: 46 mg/dL (ref 0.0–149.0)
VLDL: 9.2 mg/dL (ref 0.0–40.0)

## 2019-01-11 LAB — MICROALBUMIN / CREATININE URINE RATIO
Creatinine,U: 66.5 mg/dL
Microalb Creat Ratio: 1.1 mg/g (ref 0.0–30.0)
Microalb, Ur: <0.7 mg/dL (ref 0.0–1.9)

## 2019-01-11 LAB — TSH: TSH: 2.04 u[IU]/mL (ref 0.35–4.50)

## 2019-01-11 MED ORDER — FREESTYLE LIBRE 2 SENSOR SYSTM MISC: 1.0000 | 3 refills | Status: DC

## 2019-01-11 MED ORDER — FREESTYLE LIBRE 2 READER SYSTM DEVI: 1.0000 | Freq: Once | 0 refills | Status: DC

## 2019-01-11 NOTE — Patient Instructions (Addendum)
Please stop at the lab.  Please increase: - Lantus to 8 units in am - Novolog Echo Pen:  6 units before tea in am (may need 5 units if stop milk) 6 units before lunch  (may need 5 units if decrease carbs) 4 units before dinner  STOP MILK! TRY NON-DAIRY MILK.  Please return in 4 months with your sugar log.

## 2019-01-11 NOTE — Progress Notes (Signed)
Patient ID: Theresa Koch, female   DOB: [birth year], 39 y.o.   MRN: [id number]  This visit occurred during the SARS-CoV-2 public health emergency.  Safety protocols were in place, including screening questions prior to the visit, additional usage of staff PPE, and extensive cleaning of exam room while observing appropriate contact time as indicated for disinfecting solutions.   HPI: Theresa Koch is a 39 y.o.-year-old female, initially referred by Dr. Synetta Shadow, returning for f/u for DM1, dx in 2011, but at DM1 in 02/2015, insulin-dependent since 2013, uncontrolled, without long term complications. Last visit 10 months ago.  She is not usually compliant with visits. BCBS now.  Reviewed HbA1c levels: Lab Results  Component Value Date   HGBA1C 8.8 (A) 03/22/2018   HGBA1C 7.9 (A) 09/04/2017   HGBA1C 7.8 04/10/2017   She is on: - Lantus 6 units at night >> moved to am >> 10 >> 8 >> 6 units in am - Novolog 3-5 >> 8 >> 6 >> 6-7 >> 5-6  units before meals We stopped Metformin XR 500 mg 3x a day with meals. We stopped Glipizide XL 5 mg in am.  Pt.checks her sugars more than 4 times a day with her CGM.  Freestyle libre CGM parameters: - Average: 136 >> 156 - % active CGM time: 81% of the time - Glucose variability 39.3% (target < or = to 36%) - time in range:  - very low (<54): 2% >> 5% - low (54-69): 8% >> 4% - normal range (70-180): 70% >> 58% - high sugars (181-250): 19% >> 25% - very high sugars (>250): 1% >> 8%   Lowest sugar was 40 >> 47 (Libre); she has hypoglycemia awareness in the 60s. Highest sugar was 277 >> 264.  Glucometer: One Touch Ultra  Pt's meals are: - Breakfast: tea with milk + cookies - Lunch: salad, soup, cheese  - Dinner: egg, cheese, meat, sometimes cereals - after dinner: milk  No CKD; latest BUN/creatinine: Lab Results  Component Value Date   BUN 18 04/10/2017   Lab Results  Component Value Date   CREATININE 0.61 04/10/2017   No HL: Lab  Results  Component Value Date   CHOL 137 04/10/2017   HDL 55.00 04/10/2017   LDLCALC 73 04/10/2017   TRIG 45.0 04/10/2017   CHOLHDL 2 04/10/2017   - last eye exam was on 03/2017: No DR - no numbness and tingling in her feet.  Latest TSH was normal: Lab Results  Component Value Date   TSH 1.19 04/10/2017   ROS: Constitutional: no weight gain/no weight loss, no fatigue, no subjective hyperthermia, no subjective hypothermia Eyes: no blurry vision, no xerophthalmia ENT: no sore throat, no nodules palpated in neck, no dysphagia, no odynophagia, no hoarseness Cardiovascular: no CP/no SOB/no palpitations/no leg swelling Respiratory: no cough/no SOB/no wheezing Gastrointestinal: no N/no V/no D/no C/no acid reflux Musculoskeletal: no muscle aches/no joint aches Skin: no rashes, no hair loss Neurological: no tremors/no numbness/no tingling/no dizziness, + HA  I reviewed pt's medications, allergies, PMH, social hx, family hx, and changes were documented in the history of present illness. Otherwise, unchanged from my initial visit note.  Past Medical History:  Diagnosis Date  . Diabetes mellitus without complication [facility])    Past Surgical History:  Procedure Laterality Date  . CESAREAN SECTION    . CESAREAN SECTION N/A 05/09/2016   Procedure: CESAREAN SECTION;  Surgeon: Bobbye Charleston, MD;  Location: [place];  Service: Obstetrics;  Laterality: N/A;   History  Social History  . Marital Status: Married    Spouse Name: N/A    Number of Children: 1   Occupational History  . 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 Smoking status: Never Smoker   . Smokeless tobacco: Not on file  . Alcohol Use: No  . Drug Use: No   Current Outpatient Medications on File Prior to Visit  Medication Sig Dispense Refill  . BD PEN NEEDLE NANO 2ND GEN 32G X 4 MM MISC USE AS DIRECTED FIVE TIMES DAILY 200 each 3  . Continuous Blood Gluc Sensor  (FREESTYLE LIBRE 14 DAY SENSOR) MISC 1 each by Does not apply route every 14 (fourteen) days. Change every 2 weeks 6 each 4  . Insulin Glargine (LANTUS SOLOSTAR) 100 UNIT/ML Solostar Pen Inject 8 Units into the skin daily. (Patient taking differently: Inject 6-7 Units into the skin daily. ) 5 pen 11  . NOVOLOG FLEXPEN 100 UNIT/ML FlexPen INJECT 3-5 UNITS INTO THE SKIN 3 (THREE) TIMES DAILY WITH MEALS. 15 mL 11  . ACCU-CHEK SOFTCLIX LANCETS lancets USE TO TEST BLOOD SUGAR 6 TIMES DAILY AS INSTRUCTED. DX CODE: E95.284 (Patient not taking: Reported on 01/11/2019) 200 each 3  . Blood Glucose Monitoring Suppl (ACCU-CHEK AVIVA PLUS) w/Device KIT Use to test blood sugar 6 times daily as instructed. Dx code: O76.912 (Patient not taking: Reported on 01/11/2019) 1 kit 0  . glucose blood (ACCU-CHEK AVIVA PLUS) test strip USE TO TEST BLOOD SUGAR 6 TIMES DAILY AS INSTRUCTED. DX CODE: X32.440 (Patient not taking: Reported on 01/11/2019) 200 each 5   No current facility-administered medications on file prior to visit.   Allergies  Allergen Reactions  . Penicillins Rash    Has patient had a PCN reaction causing immediate rash, facial/tongue/throat swelling, SOB or lightheadedness with hypotension: Yes Has patient had a PCN reaction causing severe rash involving mucus membranes or skin necrosis: No Has patient had a PCN reaction that required hospitalization No Has patient had a PCN reaction occurring within the last 10 years: No If all of the above answers are "NO", then may proceed with Cephalosporin use. Childhood allergy     Family History  Problem Relation Age of Onset  . Diabetes Father   . Kidney disease Father   . Diabetes Sister   . Diabetes Brother    PE: BP 120/72   Pulse 74   Ht 5' 5.5" (1.664 m)   Wt 153 lb (69.4 kg)   SpO2 99%   BMI 25.07 kg/m  Wt Readings from Last 3 Encounters:  01/11/19 153 lb (69.4 kg)  03/22/18 148 lb (67.1 kg)  09/04/17 151 lb 6.4 oz (68.7 kg)   Constitutional:  normal weight, in NAD Eyes: PERRLA, EOMI, no exophthalmos ENT: moist mucous membranes, no thyromegaly, no cervical lymphadenopathy Cardiovascular: RRR, No MRG Respiratory: CTA B Gastrointestinal: abdomen soft, NT, ND, BS+ Musculoskeletal: no deformities, strength intact in all 4 Skin: moist, warm, no rashes Neurological: no tremor with outstretched hands, DTR normal in all 4  ASSESSMENT: 1. DM1, uncontrolled, without long term complications, but with hypo-/hyperglycemia - now pregnant  We confirmed DM1: Component     Latest Ref Rng 02/09/2015  C-Peptide     0.80 - 3.90 ng/mL 0.24 (L)  Glucose, Fasting     65 - 99 mg/dL 64 (L)  Glutamic Acid Decarb Ab     <5 IU/mL 42 (H)  Pancreatic Islet Cell Antibody     <  5 JDF Units <5    PLAN:  1. Patient with history of uncontrolled type 1 diabetes, returning after another long absence. -Reviewing her CGM downloads (we will scan these), her sugars are still quite fluctuating, usually higher after her breakfast and lunch and lower after dinner.  Upon questioning, patient is taking lower doses of Lantus and NovoLog as prescribed.  She usually takes Lantus in the morning but states she is taking only 6 units rather than 8 units, her sugars increase as the day goes by. -Her first meal is usually a small 1 but it does contain milk, tea, cookies.  She already boluses 5 units for this meal which is not enough since her postprandial blood sugars are definitely increased.  We discussed about increases the bolus to 6 units but ideally should cut out the meal.  I suggested nondairy alternatives to meals. -Her second meal is around 12 and this has more carbs than the rest of the meals.  She usually takes 5 units for this blood sugars are quite high afterwards (highest of the day) so I advised her to increase the dose to 6 units or decrease the amount of carbs that she is eating at this meal.  -However, I advised her to first increase the Lantus dose and then  make the NovoLog changes with breakfast and lunch as described above, if still needed -The last meal of the day is between 5 and 6 and he does not contain many carbs.  She drops her sugars afterwards so I advised her to back off the NovoLog dose from 5 units to only 4 units. -She usually has a glass of milk after dinner and she does not increase her sugars afterwards so we discussed about stopping this and maybe replace it with almond milk, for example -She did not have severe hypoglycemia since last visit but I do see that she had some sugars that were lower than 54 on her CGM.  She is aware that the CGM readings may be erroneous as such a low glucose levels, and if she checks with her glucometer they may be even 20 to 30 mg/dL higher - I suggested to:  Patient Instructions  Please stop at the lab.  Please increase: - Lantus to 8 units in am - Novolog Echo Pen:  6 units before tea in am (may need 5 units if stop milk) 6 units before lunch  (may need 5 units if decrease carbs) 4 units before dinner  STOP MILK! TRY NON-DAIRY MILK.  Please return in 4 months with your sugar log.   - we checked her HbA1c: 8.0% (better) - advised to check sugars at different times of the day - 4x a day, rotating check times - advised for yearly eye exams >> she is not UTD - will check annual labs today - return to clinic in 4 months   - time spent with the patient: 40 min, of which >50% was spent in reviewing her CGM downloads, discussing her hypo- and hyper-glycemic episodes, reviewing previous labs and insulin doses and developing a plan to avoid hypo- and hyper-glycemia.   Component     Latest Ref Rng & Units 01/11/2019          Glucose     65 - 99 mg/dL 108 (H)  BUN     7 - 25 mg/dL 16  Creatinine     0.50 - 1.10 mg/dL 0.55  GFR, Est Non African American     >  OR = 60 mL/min/1.70m 119  GFR, Est African American     > OR = 60 mL/min/1.757m138  BUN/Creatinine Ratio     6 - 22 (calc) NOT  APPLICABLE  Sodium     13588 146 mmol/L 136  Potassium     3.5 - 5.3 mmol/L 4.0  Chloride     98 - 110 mmol/L 102  CO2     20 - 32 mmol/L 26  Calcium     8.6 - 10.2 mg/dL 9.3  Total Protein     6.1 - 8.1 g/dL 7.2  Albumin MSPROF     3.6 - 5.1 g/dL 4.4  Globulin     1.9 - 3.7 g/dL (calc) 2.8  AG Ratio     1.0 - 2.5 (calc) 1.6  Total Bilirubin     0.2 - 1.2 mg/dL 0.3  Alkaline phosphatase (APISO)     31 - 125 U/L 44  AST     10 - 30 U/L 15  ALT     6 - 29 U/L 9  Cholesterol     0 - 200 mg/dL 173  Triglycerides     0.0 - 149.0 mg/dL 46.0  HDL Cholesterol     >39.00 mg/dL 64.00  VLDL     0.0 - 40.0 mg/dL 9.2  LDL (calc)     0 - 99 mg/dL 100 (H)  Total CHOL/HDL Ratio      3  NonHDL      109.04  Microalb, Ur     0.0 - 1.9 mg/dL <0.7  Creatinine,U     mg/dL 66.5  MICROALB/CREAT RATIO     0.0 - 30.0 mg/g 1.1  TSH     0.35 - 4.50 uIU/mL 2.04   CrPhilemon KingdomMD PhD [facility]

## 2019-01-12 LAB — COMPLETE METABOLIC PANEL WITH GFR
AG Ratio: 1.6 (calc) (ref 1.0–2.5)
ALT: 9 U/L (ref 6–29)
AST: 15 U/L (ref 10–30)
Albumin: 4.4 g/dL (ref 3.6–5.1)
Alkaline phosphatase (APISO): 44 U/L (ref 31–125)
BUN: 16 mg/dL (ref 7–25)
CO2: 26 mmol/L (ref 20–32)
Calcium: 9.3 mg/dL (ref 8.6–10.2)
Chloride: 102 mmol/L (ref 98–110)
Creat: 0.55 mg/dL (ref 0.50–1.10)
GFR, Est African American: 138 mL/min/{1.73_m2} (ref >=60)
GFR, Est Non African American: 119 mL/min/{1.73_m2} (ref >=60)
Globulin: 2.8 g/dL (calc) (ref 1.9–3.7)
Glucose, Bld: 108 mg/dL — ABNORMAL HIGH (ref 65–99)
Potassium: 4 mmol/L (ref 3.5–5.3)
Sodium: 136 mmol/L (ref 135–146)
Total Bilirubin: 0.3 mg/dL (ref 0.2–1.2)
Total Protein: 7.2 g/dL (ref 6.1–8.1)

## 2019-01-13 ENCOUNTER — Encounter: Payer: Self-pay | Admitting: Internal Medicine

## 2019-01-20 ENCOUNTER — Other Ambulatory Visit: Payer: Self-pay | Admitting: Internal Medicine

## 2019-02-01 ENCOUNTER — Other Ambulatory Visit: Payer: Self-pay | Admitting: Internal Medicine

## 2019-03-16 ENCOUNTER — Other Ambulatory Visit: Payer: Self-pay | Admitting: Internal Medicine

## 2019-04-11 ENCOUNTER — Telehealth: Payer: Self-pay | Admitting: Family Medicine

## 2019-04-11 NOTE — Telephone Encounter (Signed)
Patient has requested a call back from the clinical staff.

## 2019-04-14 NOTE — Telephone Encounter (Signed)
Called patient, no answer- left message stating I am trying to reach her to return her phone call, please call us back if you still need assistance.

## 2019-04-26 ENCOUNTER — Other Ambulatory Visit: Payer: Self-pay | Admitting: Internal Medicine

## 2019-04-26 DIAGNOSIS — E1065 Type 1 diabetes mellitus with hyperglycemia: Secondary | ICD-10-CM

## 2019-05-16 ENCOUNTER — Other Ambulatory Visit: Payer: Self-pay | Admitting: Obstetrics and Gynecology

## 2019-05-16 ENCOUNTER — Other Ambulatory Visit: Payer: Self-pay

## 2019-05-16 ENCOUNTER — Encounter: Payer: Self-pay | Admitting: Obstetrics and Gynecology

## 2019-05-16 ENCOUNTER — Ambulatory Visit (INDEPENDENT_AMBULATORY_CARE_PROVIDER_SITE_OTHER): Payer: 59 | Admitting: Obstetrics and Gynecology

## 2019-05-16 DIAGNOSIS — Z975 Presence of (intrauterine) contraceptive device: Secondary | ICD-10-CM | POA: Diagnosis not present

## 2019-05-16 DIAGNOSIS — N644 Mastodynia: Secondary | ICD-10-CM | POA: Diagnosis not present

## 2019-05-16 NOTE — Patient Instructions (Signed)

## 2019-05-16 NOTE — Progress Notes (Signed)
Theresa Koch presents with c/o intermittent right breast pain as well as a "pimple" on her right nipple. She reports the "pimple" had been there for years. It finally spontaously ruptured over a month ago and has not returned. She denies injury, trauma to breast and negative FH. Stopped breast feeding over 1 yr ago. Has a ParaGard IUD in placed.  PE AF VSS Lungs clear Heart RRR Breast sym supple no nipple discharge or adenopathy  No evidence of right nipple lesion on exam today  A/P Right Breast pain.  Will check Breast U/S. Reviewed causes of breast pain with pt. Pt reassured that today's exam was normal. Advised Vitamin E supplement for breast pain. F/U per U/S results or PRN

## 2019-05-19 ENCOUNTER — Ambulatory Visit (INDEPENDENT_AMBULATORY_CARE_PROVIDER_SITE_OTHER): Payer: 59 | Admitting: Internal Medicine

## 2019-05-19 DIAGNOSIS — Z5329 Procedure and treatment not carried out because of patient's decision for other reasons: Secondary | ICD-10-CM

## 2019-05-19 NOTE — Progress Notes (Signed)
Patient ID: Theresa Koch, female   DOB: July 18, 1980, 39 y.o.   MRN: [id number]  NO SHOW  HPI: Theresa Koch is a 39 y.o.-year-old female, initially referred by Dr. Synetta Shadow, returning for f/u for DM1, dx in 2011, but at DM1 in 02/2015, insulin-dependent since 2013, uncontrolled, without long term complications. Last visit 4 months ago.  BCBS now.  Reviewed HbA1c levels: Lab Results  Component Value Date   HGBA1C 8.0 (A) 01/11/2019   HGBA1C 8.8 (A) 03/22/2018   HGBA1C 7.9 (A) 09/04/2017   She is on: - Lantus 6 units at night >> moved to am >> 10 >> 8 >> 6 >> 8 units in am - Novolog 3-5 >> 8 >> 6 >> 6-7 >> 5-6  units before meals We stopped Metformin XR 500 mg 3x a day with meals. We stopped Glipizide XL 5 mg in am.  Pt.checks her sugars more than 4 times a day with her CGM.  Freestyle libre CGM parameters: - Average: 136 >> 156 - % active CGM time: 81% of the time - Glucose variability 39.3% (target < or = to 36%) - time in range:  - very low (<54): 2% >> 5% - low (54-69): 8% >> 4% - normal range (70-180): 70% >> 58% - high sugars (181-250): 19% >> 25% - very high sugars (>250): 1% >> 8%  Previously:  Lowest sugar was 40 >> 47 (Libre); she has hypoglycemia awareness in the 60s. Highest sugar was 277 >> 264.  Glucometer: One Touch Ultra  Pt's meals are: - Breakfast: tea with milk + cookies - Lunch: salad, soup, cheese  - Dinner: egg, cheese, meat, sometimes cereals - after dinner: milk  No CKD; latest BUN/creatinine: Lab Results  Component Value Date   BUN 16 01/11/2019   Lab Results  Component Value Date   CREATININE 0.55 01/11/2019   No HL: Lab Results  Component Value Date   CHOL 173 01/11/2019   HDL 64.00 01/11/2019   LDLCALC 100 (H) 01/11/2019   TRIG 46.0 01/11/2019   CHOLHDL 3 01/11/2019   - last eye exam was on 03/2017: No DR -She denies numbness and tingling in her feet.  Latest TSH is normal: Lab Results  Component Value Date   TSH 2.04  01/11/2019   ROS: Constitutional: no weight gain/no weight loss, no fatigue, no subjective hyperthermia, no subjective hypothermia Eyes: no blurry vision, no xerophthalmia ENT: no sore throat, no nodules palpated in neck, no dysphagia, no odynophagia, no hoarseness Cardiovascular: no CP/no SOB/no palpitations/no leg swelling Respiratory: no cough/no SOB/no wheezing Gastrointestinal: no N/no V/no D/no C/no acid reflux Musculoskeletal: no muscle aches/no joint aches Skin: no rashes, no hair loss Neurological: no tremors/no numbness/no tingling/no dizziness, + headaches  I reviewed pt's medications, allergies, PMH, social hx, family hx, and changes were documented in the history of present illness. Otherwise, unchanged from my initial visit note.  Past Medical History:  Diagnosis Date  . Diabetes mellitus without complication ([place])   . Previous cesarean section 10/30/2014   TOLAC successful 2016    Past Surgical History:  Procedure Laterality Date  . CESAREAN SECTION    . CESAREAN SECTION N/A 05/09/2016   Procedure: CESAREAN SECTION;  Surgeon: Bobbye Charleston, MD;  Location: [place];  Service: Obstetrics;  Laterality: N/A;   History   Social History  . Marital Status: Married    Spouse Name: N/A    Number of Children: 1   Occupational History  . 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 Smoking status: Never Smoker   . Smokeless tobacco: Not on file  . Alcohol Use: No  . Drug Use: No   Current Outpatient Medications on File Prior to Visit  Medication Sig Dispense Refill  . ACCU-CHEK SOFTCLIX LANCETS lancets USE TO TEST BLOOD SUGAR 6 TIMES DAILY AS INSTRUCTED. DX CODE: Q03.474 (Patient not taking: Reported on 01/11/2019) 200 each 3  . BD PEN NEEDLE NANO 2ND GEN 32G X 4 MM MISC USE AS DIRECTED FIVE TIMES DAILY 200 each 3  . Blood Glucose Monitoring Suppl (ACCU-CHEK AVIVA PLUS) w/Device KIT Use to test blood sugar 6 times daily  as instructed. Dx code: O34.912 (Patient not taking: Reported on 01/11/2019) 1 kit 0  . Continuous Blood Gluc Sensor (FREESTYLE LIBRE 14 DAY SENSOR) MISC 1 each by Does not apply route every 14 (fourteen) days. Change every 2 weeks 6 each 4  . Continuous Blood Gluc Sensor (FREESTYLE LIBRE 2 SENSOR SYSTM) MISC 1 each by Does not apply route every 14 (fourteen) days. 6 each 3  . glucose blood (ACCU-CHEK AVIVA PLUS) test strip USE TO TEST BLOOD SUGAR 4 TIMES DAILY AS INSTRUCTED. DX CODE: O24.912 100 strip 11  . Insulin Glargine (LANTUS SOLOSTAR) 100 UNIT/ML Solostar Pen Inject 8 Units into the skin daily. (Patient taking differently: Inject 6-7 Units into the skin daily. ) 5 pen 11  . NOVOLOG FLEXPEN 100 UNIT/ML FlexPen INJECT 3-5 UNITS INTO THE SKIN 3 (THREE) TIMES DAILY WITH MEALS. 15 mL 11  . NOVOLOG PENFILL cartridge INJECT 3-5 UNITS INTO THE SKIN 3 TIMES DAILY WITH MEALS 15 mL 11   No current facility-administered medications on file prior to visit.   Allergies  Allergen Reactions  . Penicillins Rash    Has patient had a PCN reaction causing immediate rash, facial/tongue/throat swelling, SOB or lightheadedness with hypotension: Yes Has patient had a PCN reaction causing severe rash involving mucus membranes or skin necrosis: No Has patient had a PCN reaction that required hospitalization No Has patient had a PCN reaction occurring within the last 10 years: No If all of the above answers are "NO", then may proceed with Cephalosporin use. Childhood allergy     Family History  Problem Relation Age of Onset  . Diabetes Father   . Kidney disease Father   . Diabetes Sister   . Diabetes Brother    PE: LMP 05/11/2019  Wt Readings from Last 3 Encounters:  05/16/19 150 lb 9.6 oz (68.3 kg)  01/11/19 153 lb (69.4 kg)  03/22/18 148 lb (67.1 kg)   Constitutional: normal weight, in NAD Eyes: PERRLA, EOMI, no exophthalmos ENT: moist mucous membranes, no thyromegaly, no cervical  lymphadenopathy Cardiovascular: RRR, No MRG Respiratory: CTA B Gastrointestinal: abdomen soft, NT, ND, BS+ Musculoskeletal: no deformities, strength intact in all 4 Skin: moist, warm, no rashes Neurological: no tremor with outstretched hands, DTR normal in all 4  ASSESSMENT: 1. DM1, uncontrolled, without long term complications, but with hypo-/hyperglycemia - now pregnant  We confirmed DM1: Component     Latest Ref Rng 02/09/2015  C-Peptide     0.80 - 3.90 ng/mL 0.24 (L)  Glucose, Fasting     65 - 99 mg/dL 64 (L)  Glutamic Acid Decarb Ab     <5 IU/mL 42 (H)  Pancreatic Islet Cell Antibody     < 5 JDF Units <5    PLAN:  1. Patient with   history of uncontrolled type  1 diabetes, returning after another long absence. -Reviewing her CGM downloads (we will scan these), her sugars are still quite fluctuating, usually higher after her breakfast and lunch and lower after dinner.  Upon questioning, patient is taking lower doses of Lantus and NovoLog as prescribed.  She usually takes Lantus in the morning but states she is taking only 6 units rather than 8 units, her sugars increase as the day goes by. -Her first meal is usually a small 1 but it does contain milk, tea, cookies.  She already boluses 5 units for this meal which is not enough since her postprandial blood sugars are definitely increased.  We discussed about increases the bolus to 6 units but ideally should cut out the meal.  I suggested nondairy alternatives to meals. -Her second meal is around 12 and this has more carbs than the rest of the meals.  She usually takes 5 units for this blood sugars are quite high afterwards (highest of the day) so I advised her to increase the dose to 6 units or decrease the amount of carbs that she is eating at this meal.  -However, I advised her to first increase the Lantus dose and then make the NovoLog changes with breakfast and lunch as described above, if still needed -The last meal of the day  is between 5 and 6 and he does not contain many carbs.  She drops her sugars afterwards so I advised her to back off the NovoLog dose from 5 units to only 4 units. -She usually has a glass of milk after dinner and she does not increase her sugars afterwards so we discussed about stopping this and maybe replace it with almond milk, for example -She did not have severe hypoglycemia since last visit but I do see that she had some sugars that were lower than 54 on her CGM.  She is aware that the CGM readings may be erroneous as such a low glucose levels, and if she checks with her glucometer they may be even 20 to 30 mg/dL higher  - I suggested to:  Patient Instructions  Please continue: - Lantus 8 units in am - Novolog Echo Pen:  6 units before tea in am (may need 5 units if stop milk) 6 units before lunch  (may need 5 units if decrease carbs) 4 units before dinner  Please return in 4 months with your sugar log.   - we checked her HbA1c: 7%  - advised to check sugars at different times of the day - 4x a day, rotating check times - advised for yearly eye exams >> she is not UTD - return to clinic in 4 months   - time spent with the patient: 25 min, of which >50% was spent in reviewing her CGM downloads, discussing her hypo- and hyper-glycemic episodes, reviewing previous labs and  insulin doses and developing a plan to avoid hypo- and hyper-glycemia.   Philemon Kingdom, MD PhD [facility] Endocrinology

## 2019-05-20 ENCOUNTER — Encounter: Payer: Self-pay | Admitting: Internal Medicine

## 2019-06-07 ENCOUNTER — Ambulatory Visit
Admission: RE | Admit: 2019-06-07 | Discharge: 2019-06-07 | Disposition: A | Discharge status: Home / Self Care | Payer: 59 | Source: Ambulatory Visit | Attending: Obstetrics and Gynecology | Admitting: Obstetrics and Gynecology

## 2019-06-07 ENCOUNTER — Other Ambulatory Visit: Payer: Self-pay

## 2019-06-07 ENCOUNTER — Ambulatory Visit: Payer: 59

## 2019-06-07 DIAGNOSIS — N644 Mastodynia: Secondary | ICD-10-CM

## 2019-06-12 ENCOUNTER — Other Ambulatory Visit: Payer: Self-pay | Admitting: Internal Medicine

## 2019-06-23 ENCOUNTER — Ambulatory Visit: Payer: 59

## 2019-06-23 ENCOUNTER — Other Ambulatory Visit: Payer: Self-pay

## 2019-06-23 ENCOUNTER — Ambulatory Visit
Admission: RE | Admit: 2019-06-23 | Discharge: 2019-06-23 | Disposition: A | Discharge status: Home / Self Care | Payer: 59 | Source: Ambulatory Visit | Attending: Obstetrics and Gynecology | Admitting: Obstetrics and Gynecology

## 2019-07-15 ENCOUNTER — Other Ambulatory Visit: Payer: Self-pay | Admitting: Internal Medicine

## 2019-07-15 NOTE — Telephone Encounter (Signed)
Is this ok to refill?  Patient no showed her appointment and has not been seen since 01-2019.

## 2019-07-28 ENCOUNTER — Other Ambulatory Visit: Payer: Self-pay | Admitting: Internal Medicine

## 2019-07-28 DIAGNOSIS — E1065 Type 1 diabetes mellitus with hyperglycemia: Secondary | ICD-10-CM

## 2019-09-21 ENCOUNTER — Other Ambulatory Visit: Payer: Self-pay | Admitting: Internal Medicine

## 2019-11-10 ENCOUNTER — Encounter: Payer: Self-pay | Admitting: Internal Medicine

## 2019-11-22 ENCOUNTER — Other Ambulatory Visit: Payer: Self-pay | Admitting: Internal Medicine

## 2019-11-23 NOTE — Telephone Encounter (Signed)
On the last refill its notated that the patient needed to make an appointment for further refills. No appointment has been scheduled. Please advise.

## 2019-11-25 ENCOUNTER — Ambulatory Visit (INDEPENDENT_AMBULATORY_CARE_PROVIDER_SITE_OTHER): Payer: 59 | Admitting: Internal Medicine

## 2019-11-25 ENCOUNTER — Encounter: Payer: Self-pay | Admitting: Internal Medicine

## 2019-11-25 ENCOUNTER — Other Ambulatory Visit: Payer: Self-pay

## 2019-11-25 VITALS — BP 110/70 | HR 71 | Ht 65.0 in | Wt 154.8 lb

## 2019-11-25 DIAGNOSIS — E1065 Type 1 diabetes mellitus with hyperglycemia: Secondary | ICD-10-CM | POA: Diagnosis not present

## 2019-11-25 LAB — LIPID PANEL
Cholesterol: 159 mg/dL (ref 0–200)
HDL: 53.3 mg/dL (ref >39.00)
LDL Cholesterol: 91 mg/dL (ref 0–99)
NonHDL: 105.68
Total CHOL/HDL Ratio: 3
Triglycerides: 71 mg/dL (ref 0.0–149.0)
VLDL: 14.2 mg/dL (ref 0.0–40.0)

## 2019-11-25 LAB — MICROALBUMIN / CREATININE URINE RATIO
Creatinine,U: 62.3 mg/dL
Microalb Creat Ratio: 1.1 mg/g (ref 0.0–30.0)
Microalb, Ur: <0.7 mg/dL (ref 0.0–1.9)

## 2019-11-25 LAB — COMPREHENSIVE METABOLIC PANEL
ALT: 9 U/L (ref 0–35)
AST: 15 U/L (ref 0–37)
Albumin: 4 g/dL (ref 3.5–5.2)
Alkaline Phosphatase: 45 U/L (ref 39–117)
BUN: 15 mg/dL (ref 6–23)
CO2: 29 mEq/L (ref 19–32)
Calcium: 9 mg/dL (ref 8.4–10.5)
Chloride: 99 mEq/L (ref 96–112)
Creatinine, Ser: 0.61 mg/dL (ref 0.40–1.20)
GFR: 112.35 mL/min (ref >60.00)
Glucose, Bld: 282 mg/dL — ABNORMAL HIGH (ref 70–99)
Potassium: 4.2 mEq/L (ref 3.5–5.1)
Sodium: 134 mEq/L — ABNORMAL LOW (ref 135–145)
Total Bilirubin: 0.4 mg/dL (ref 0.2–1.2)
Total Protein: 7.1 g/dL (ref 6.0–8.3)

## 2019-11-25 LAB — POCT GLYCOSYLATED HEMOGLOBIN (HGB A1C): Hemoglobin A1C: 8.1 % — AB (ref 4.0–5.6)

## 2019-11-25 LAB — TSH: TSH: 1.88 u[IU]/mL (ref 0.35–4.50)

## 2019-11-25 MED ORDER — NOVOLOG FLEXPEN 100 UNIT/ML ~~LOC~~ SOPN: PEN_INJECTOR | SUBCUTANEOUS | 3 refills | Status: DC

## 2019-11-25 MED ORDER — FREESTYLE LIBRE 2 SENSOR MISC
1.0000 | 3 refills | Status: DC
Start: 2019-11-25 — End: 2020-12-05

## 2019-11-25 MED ORDER — LANTUS SOLOSTAR 100 UNIT/ML ~~LOC~~ SOPN: 5.0000 [IU] | PEN_INJECTOR | Freq: Two times a day (BID) | SUBCUTANEOUS | 3 refills | Status: DC

## 2019-11-25 NOTE — Patient Instructions (Addendum)
Please split: - Lantus 5 units in am and 5 units at night  Please change: - Novolog:  5 units before tea in am  7-8 units before lunch  (try to decrease carbs with this meal) 4 units before dinner  Please stop at the lab.  Please return in 4 months.

## 2019-11-25 NOTE — Progress Notes (Signed)
Patient ID: Theresa Koch, female   DOB: [birth year], 39 y.o.   MRN: [id number]  This visit occurred during the SARS-CoV-2 public health emergency.  Safety protocols were in place, including screening questions prior to the visit, additional usage of staff PPE, and extensive cleaning of exam room while observing appropriate contact time as indicated for disinfecting solutions.   HPI: Theresa Koch is a 39 y.o.-year-old female, initially referred by Dr. Synetta Shadow, returning for f/u for DM1, dx in 2011, but at DM1 in 02/2015, insulin-dependent since 2013, uncontrolled, without long term complications. Last visit 10.5 months ago.  BCBS now.  Reviewed HbA1c levels: Lab Results  Component Value Date   HGBA1C 8.0 (A) 01/11/2019   HGBA1C 8.8 (A) 03/22/2018   HGBA1C 7.9 (A) 09/04/2017   She is on: - Lantus 6 units at night >> moved to am >> 10 >> 8 >> 6 >> 8 units in a.m. (tried to take it at night >> lows in am) - Novolog 3-5 >> 8 >> 6 >> 6-7 >> 6-6-4 units before meals We stopped Metformin XR 500 mg 3x a day with meals. We stopped Glipizide XL 5 mg in am.  Pt.checks her sugar is more than 4 times a day with her libre CGM:  Freestyle libre CGM parameters: - Average: 136 >> 156 - % active CGM time: 81% of the time - Glucose variability 39.3% (target < or = to 36%) - time in range:  - very low (<54): 2% >> 5% >> 1% - low (54-69): 8% >> 4% >> 8% - normal range (70-180): 70% >> 58% >> 56% - high sugars (181-250): 19% >> 25% >> 25% - very high sugars (>250): 1% >> 8% >> 10%    Previously:  Lowest sugar was 40 >> 47 (Libre) >> 50s; she has hypoglycemia awareness in the 60s. Highest sugar was 277 >> 264 >> 300  Glucometer: One Touch Ultra  Pt's meals are: - Breakfast: tea with milk + cookies - Lunch: salad, soup, cheese  - Dinner: egg, cheese, meat, sometimes cereals - after dinner: milk >> stopped since last OV  No CKD; latest BUN/creatinine: Lab Results   Component Value Date   BUN 16 01/11/2019   Lab Results  Component Value Date   CREATININE 0.55 01/11/2019   No MAU: Lab Results  Component Value Date   MICRALBCREAT 1.1 01/11/2019   MICRALBCREAT 0.9 04/10/2017   MICRALBCREAT 0.7 05/23/2015  She is not on ACE inhibitor/ARB.  + Mild HL: Lab Results  Component Value Date   CHOL 173 01/11/2019   HDL 64.00 01/11/2019   LDLCALC 100 (H) 01/11/2019   TRIG 46.0 01/11/2019   CHOLHDL 3 01/11/2019  She is not on a statin.  - last eye exam was in 10/2019: No DR reportedly. New glasses, but has blurry vision with them. - no numbness and tingling in her feet.  Latest TSH was reviewed and this was normal: Lab Results  Component Value Date   TSH 2.04 01/11/2019   ROS: Constitutional: no weight gain/no weight loss, no fatigue, no subjective hyperthermia, no subjective hypothermia Eyes: + blurry vision, no xerophthalmia ENT: no sore throat, no nodules palpated in neck, no dysphagia, no odynophagia, no hoarseness Cardiovascular: no CP/no SOB/no palpitations/no leg swelling Respiratory: no cough/no SOB/no wheezing Gastrointestinal: no N/no V/no D/no C/no acid reflux Musculoskeletal: no muscle aches/no joint aches Skin: no rashes, no hair loss Neurological: no tremors/no numbness/no tingling/no dizziness, + HAs  I reviewed pt's  medications, allergies, PMH, social hx, family hx, and changes were documented in the history of present illness. Otherwise, unchanged from my initial visit note.  Past Medical History:  Diagnosis Date  . Diabetes mellitus without complication ([place])   . Previous cesarean section 10/30/2014   TOLAC successful 2016    Past Surgical History:  Procedure Laterality Date  . CESAREAN SECTION    . CESAREAN SECTION N/A 05/09/2016   Procedure: CESAREAN SECTION;  Surgeon: Bobbye Charleston, MD;  Location: [place];  Service: Obstetrics;  Laterality: N/A;   History   Social History  . Marital Status:  Married    Spouse Name: N/A    Number of Children: 1   Occupational History  . 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 Smoking status: Never Smoker   . Smokeless tobacco: Not on file  . Alcohol Use: No  . Drug Use: No   Current Outpatient Medications on File Prior to Visit  Medication Sig Dispense Refill  . ACCU-CHEK SOFTCLIX LANCETS lancets USE TO TEST BLOOD SUGAR 6 TIMES DAILY AS INSTRUCTED. DX CODE: F75.102 (Patient not taking: Reported on 01/11/2019) 200 each 3  . BD PEN NEEDLE NANO 2ND GEN 32G X 4 MM MISC USE AS DIRECTED FIVE TIMES DAILY 200 each 3  . Blood Glucose Monitoring Suppl (ACCU-CHEK AVIVA PLUS) w/Device KIT Use to test blood sugar 6 times daily as instructed. Dx code: O83.912 (Patient not taking: Reported on 01/11/2019) 1 kit 0  . Continuous Blood Gluc Receiver (FREESTYLE LIBRE 2 READER) DEVI 1 EACH BY DOES NOT APPLY ROUTE ONCE FOR 1 DOSE. 1 each 0  . Continuous Blood Gluc Sensor (FREESTYLE LIBRE 14 DAY SENSOR) MISC 1 each by Does not apply route every 14 (fourteen) days. Change every 2 weeks 6 each 4  . Continuous Blood Gluc Sensor (FREESTYLE LIBRE 2 SENSOR SYSTM) MISC 1 each by Does not apply route every 14 (fourteen) days. 6 each 3  . glucose blood (ACCU-CHEK AVIVA PLUS) test strip USE TO TEST BLOOD SUGAR 4 TIMES DAILY AS INSTRUCTED. DX CODE: O24.912 100 strip 11  . LANTUS SOLOSTAR 100 UNIT/ML Solostar Pen INJECT 8 UNITS INTO THE SKIN DAILY. NEED APPOINTMENT FOR FURTHER REFILLS 15 mL 0  . NOVOLOG FLEXPEN 100 UNIT/ML FlexPen INJECT 3-5 UNITS INTO THE SKIN 3 (THREE) TIMES DAILY WITH MEALS. 15 mL 11  . NOVOLOG PENFILL cartridge INJECT 3-5 UNITS INTO THE SKIN 3 TIMES DAILY WITH MEALS 15 mL 11   No current facility-administered medications on file prior to visit.   Allergies  Allergen Reactions  . Penicillins Rash    Has patient had a PCN reaction causing immediate rash, facial/tongue/throat swelling, SOB or lightheadedness with  hypotension: Yes Has patient had a PCN reaction causing severe rash involving mucus membranes or skin necrosis: No Has patient had a PCN reaction that required hospitalization No Has patient had a PCN reaction occurring within the last 10 years: No If all of the above answers are "NO", then may proceed with Cephalosporin use. Childhood allergy     Family History  Problem Relation Age of Onset  . Diabetes Father   . Kidney disease Father   . Diabetes Sister   . Diabetes Brother   . Breast cancer Neg Hx    PE: BP 110/70   Pulse 71   Ht _0  (1.651 m)   Wt 154 lb 12.8 oz (70.2 kg)   SpO2 99%  BMI 25.76 kg/m  Wt Readings from Last 3 Encounters:  11/25/19 154 lb 12.8 oz (70.2 kg)  05/16/19 150 lb 9.6 oz (68.3 kg)  01/11/19 153 lb (69.4 kg)   Constitutional: normal weight, in NAD Eyes: PERRLA, EOMI, no exophthalmos ENT: moist mucous membranes, no thyromegaly, no cervical lymphadenopathy Cardiovascular: RRR, No MRG Respiratory: CTA B Gastrointestinal: abdomen soft, NT, ND, BS+ Musculoskeletal: no deformities, strength intact in all 4 Skin: moist, warm, no rashes Neurological: no tremor with outstretched hands, DTR normal in all 4  ASSESSMENT: 1. DM1, uncontrolled, without long term complications, but with hypo-/hyperglycemia - now pregnant  We confirmed DM1: Component     Latest Ref Rng 02/09/2015  C-Peptide     0.80 - 3.90 ng/mL 0.24 (L)  Glucose, Fasting     65 - 99 mg/dL 64 (L)  Glutamic Acid Decarb Ab     <5 IU/mL 42 (H)  Pancreatic Islet Cell Antibody     < 5 JDF Units <5   2. HL  PLAN:  1. Patient with history of uncontrolled type 1 diabetes, returning after another long absence, after I refused to refill her medications. -At last visit, sugars were still fluctuating per review of her CGM downloads.  They were usually higher after breakfast and lunch and lower after dinner.  Upon questioning, patient was taking lower doses of Lantus and NovoLog than  prescribed.  She was usually taking Lantus in the morning but only 6, rather than the recommended 8 units.  Her meal was usually a small amount but did contain milk, tea, cookies.  She was bolusing 5 units for this meal but this was not enough since her postprandial blood sugars were higher than goal.  We increased the bolus to 6 units but I suggested to ideally improve the meal.  I suggested nondairy alternatives.  Her second meal was around 12 PM and this had more carbs than the rest of the meals.  She has only taking 5 units of insulin for this but will increase to 6 units.  I advised him to decrease the amount of carbs with a meal, if possible.  At night, she was taking between 5 to 6 units and, since this meal did not contain many carbs, she was dropping her blood sugars afterwards.  I advised her to decrease the dose to only 4 units.  She usually had a glass of milk after dinner and I advised her to stop this and replace it daily with almond milk, if possible.  She had some low blood sugars on her CGM at last visit, in the 50 range and I advised her that this may be 20 to 30 mg/dL higher if checked with the glucometer. CGM interpretation: -At today's visit, we reviewed her CGM downloads: It appears that 56% of values are in target range compared to 58% at last visit., while 35% are higher than 180, and 9% are lower than 70.  It appears that she has less lows since last visit, but she still sees lows quite frequently.  The calculated average blood sugar is 161.  The projected HbA1c for the next 3 months (GMI) is 7.2%. -Reviewing the CGM trends, it appears that her sugars increase overnight if she takes Lantus in the middle of the day, around 11-12.  She did try to move the entire dose of Lantus at night, but this dropped her sugars too low, in the 50s.  She tried to decrease the dose to 6 units and still  taking it at night, but she continues to have low blood sugars on this dose.  At this visit, I advised  her to try splitting the Lantus into 5 units twice a day.  I advised her to take them 12 hours apart. -Also, her sugars appear to decrease after breakfast, increase significantly after lunch, and remain controlled after dinner.  Therefore, I advised her to continue the same dose of NovoLog insulin with dinner, to increase the dose of NovoLog with lunch and decrease the dose with breakfast.  I also advised her to try to limit the carbs that she eats for lunch. - I suggested to:  Patient Instructions  Please split: - Lantus 5 units in am and 5 units at night  Please change: - Novolog:  5 units before tea in am  7-8 units before lunch  (try to decrease carbs with this meal) 4 units before dinner  Please stop at the lab.  Please return in 4 months.  - we checked her HbA1c: 8.1% (slightly higher) - advised to check sugars at different times of the day - 3-4x a day, rotating check times - advised for yearly eye exams >> she is UTD - will check annual labs today - return to clinic in 4 months   2. HL - Reviewed latest lipid panel  -her LDL was slightly higher than goal Lab Results  Component Value Date   CHOL 173 01/11/2019   HDL 64.00 01/11/2019   LDLCALC 100 (H) 01/11/2019   TRIG 46.0 01/11/2019   CHOLHDL 3 01/11/2019  -She is not on a statin -At last visit, I suggested changes in her diet by cutting out dairy and also reducing sweets -We will repeat her lipid panel today  Component     Latest Ref Rng & Units 11/25/2019          Sodium     135 - 145 mEq/L 134 (L)  Potassium     3.5 - 5.1 mEq/L 4.2  Chloride     96 - 112 mEq/L 99  CO2     19 - 32 mEq/L 29  Glucose     70 - 99 mg/dL 282 (H)  BUN     6 - 23 mg/dL 15  Creatinine     0.40 - 1.20 mg/dL 0.61  Total Bilirubin     0.2 - 1.2 mg/dL 0.4  Alkaline Phosphatase     39 - 117 U/L 45  AST     0 - 37 U/L 15  ALT     0 - 35 U/L 9  Total Protein     6.0 - 8.3 g/dL 7.1  Albumin     3.5 - 5.2 g/dL 4.0  GFR      >60.00 mL/min 112.35  Calcium     8.4 - 10.5 mg/dL 9.0  Cholesterol     0 - 200 mg/dL 159  Triglycerides     0 - 149 mg/dL 71.0  HDL Cholesterol     >39.00 mg/dL 53.30  VLDL     0.0 - 40.0 mg/dL 14.2  LDL (calc)     0 - 99 mg/dL 91  Total CHOL/HDL Ratio      3  NonHDL      105.68  Microalb, Ur     0.0 - 1.9 mg/dL <0.7  Creatinine,U     mg/dL 62.3  MICROALB/CREAT RATIO     0.0 - 30.0 mg/g 1.1  TSH     0.35 -  4.50 uIU/mL 1.88   Labs are normal with the exception of a high blood sugar and pseudohyponatremia.  Philemon Kingdom, MD PhD [facility] Endocrinology

## 2020-02-10 ENCOUNTER — Other Ambulatory Visit: Payer: Self-pay | Admitting: Internal Medicine

## 2020-02-10 DIAGNOSIS — E1065 Type 1 diabetes mellitus with hyperglycemia: Secondary | ICD-10-CM

## 2020-03-30 ENCOUNTER — Ambulatory Visit: Payer: 59 | Admitting: Internal Medicine

## 2020-05-31 ENCOUNTER — Encounter: Payer: Self-pay | Admitting: Internal Medicine

## 2020-05-31 ENCOUNTER — Other Ambulatory Visit: Payer: Self-pay

## 2020-05-31 ENCOUNTER — Ambulatory Visit (INDEPENDENT_AMBULATORY_CARE_PROVIDER_SITE_OTHER): Payer: 59 | Admitting: Internal Medicine

## 2020-05-31 VITALS — BP 112/78 | HR 64 | Ht 65.0 in | Wt 154.0 lb

## 2020-05-31 DIAGNOSIS — E1065 Type 1 diabetes mellitus with hyperglycemia: Secondary | ICD-10-CM | POA: Diagnosis not present

## 2020-05-31 LAB — POCT GLYCOSYLATED HEMOGLOBIN (HGB A1C): Hemoglobin A1C: 7.9 % — AB (ref 4.0–5.6)

## 2020-05-31 MED ORDER — LANTUS SOLOSTAR 100 UNIT/ML ~~LOC~~ SOPN: 10.0000 [IU] | PEN_INJECTOR | Freq: Every day | SUBCUTANEOUS | 3 refills | Status: DC

## 2020-05-31 MED ORDER — NOVOLOG FLEXPEN 100 UNIT/ML ~~LOC~~ SOPN: PEN_INJECTOR | SUBCUTANEOUS | 3 refills | Status: DC

## 2020-05-31 NOTE — Patient Instructions (Addendum)
Please split: - Lantus 5-6 units 2x a day  Continue: - Novolog:  5 units 15-30 before tea in am  7-8 units before lunch 4 units before dinner  Please return in 4 months.

## 2020-05-31 NOTE — Progress Notes (Signed)
Patient ID: Theresa Koch, female   DOB: May 06, 1980, 40 y.o.   MRN: [id number]  This visit occurred during the SARS-CoV-2 public health emergency.  Safety protocols were in place, including screening questions prior to the visit, additional usage of staff PPE, and extensive cleaning of exam room while observing appropriate contact time as indicated for disinfecting solutions.   HPI: Theresa Koch is a 40 y.o.-year-old female, initially referred by Dr. Synetta Shadow, returning for f/u for DM1, dx in 2011, but at DM1 in 02/2015, insulin-dependent since 2013, uncontrolled, without long term complications. Last visit 6 months ago.  BCBS now.  Interim history: No increased urination, blurry vision, nausea. HAs resolved after changing glasses. She is preparing for 3 months trip to [place] and [place] starting next month.  Reviewed HbA1c levels: Lab Results  Component Value Date   HGBA1C 8.1 (A) 11/25/2019   HGBA1C 8.0 (A) 01/11/2019   HGBA1C 8.8 (A) 03/22/2018   She is on: - Lantus 6 units at night >> moved to am >> 10 >> 8 >> 6 >> 8 units in a.m. (tried to take it at night >> lows in am) >> 5 units twice a day >> 10 units at 10 am - Novolog: 5 units before tea in am  7-8 units before lunch  (try to decrease carbs with this meal) 4 units before dinner We stopped Metformin XR 500 mg 3x a day with meals. We stopped Glipizide XL 5 mg in am.  Pt.checks her sugar is more than 4 times a day with her libre CGM:   Previously:   Previously:  Lowest sugar was 40 >> 47 (Libre) >> 50s >> 50s; she has hypoglycemia awareness in the 59s. Highest sugar was 277 >> 264 >> 300 >> 300s.  Glucometer: One Touch Ultra  Pt's meals are: - Breakfast: tea with milk + cookies >> now only tea - Lunch: salad, soup, cheese  - Dinner: egg, cheese, meat, sometimes cereals - after dinner: milk >> stopped since last OV  No CKD; latest BUN/creatinine: Lab Results  Component Value Date    BUN 15 11/25/2019   Lab Results  Component Value Date   CREATININE 0.61 11/25/2019   No MAU: Lab Results  Component Value Date   MICRALBCREAT 1.1 11/25/2019   MICRALBCREAT 1.1 01/11/2019   MICRALBCREAT 0.9 04/10/2017   MICRALBCREAT 0.7 05/23/2015  She is not on ACE inhibitor/ARB.  + Mild HL: Lab Results  Component Value Date   CHOL 159 11/25/2019   HDL 53.30 11/25/2019   LDLCALC 91 11/25/2019   TRIG 71.0 11/25/2019   CHOLHDL 3 11/25/2019  She is not on a statin.  - last eye exam was in 10/2019: No DR reportedly. New glasses, but has blurry vision with them. - no numbness and tingling in her feet.  Latest TSH was reviewed and this was normal: Lab Results  Component Value Date   TSH 1.88 11/25/2019   ROS: Constitutional: no weight gain/no weight loss, no fatigue, no subjective hyperthermia, no subjective hypothermia Eyes: no blurry vision, no xerophthalmia ENT: no sore throat, no nodules palpated in neck, no dysphagia, no odynophagia, no hoarseness Cardiovascular: no CP/no SOB/no palpitations/no leg swelling Respiratory: no cough/no SOB/no wheezing Gastrointestinal: no N/no V/no D/no C/no acid reflux Musculoskeletal: no muscle aches/no joint aches Skin: no rashes, no hair loss Neurological: no tremors/no numbness/no tingling/no dizziness, no more HAs after changing glasses  I reviewed pt's medications, allergies, PMH, social hx, family hx, and changes  were documented in the history of present illness. Otherwise, unchanged from my initial visit note.  Past Medical History:  Diagnosis Date  . Diabetes mellitus without complication ([place])   . Previous cesarean section 10/30/2014   TOLAC successful 2016    Past Surgical History:  Procedure Laterality Date  . CESAREAN SECTION    . CESAREAN SECTION N/A 05/09/2016   Procedure: CESAREAN SECTION;  Surgeon: Bobbye Charleston, MD;  Location: [facility];  Service: Obstetrics;  Laterality: N/A;   History   Social  History  . Marital Status: Married    Spouse Name: N/A    Number of Children: 1   Occupational History  . 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 Smoking status: Never Smoker   . Smokeless tobacco: Not on file  . Alcohol Use: No  . Drug Use: No   Current Outpatient Medications on File Prior to Visit  Medication Sig Dispense Refill  . ACCU-CHEK SOFTCLIX LANCETS lancets USE TO TEST BLOOD SUGAR 6 TIMES DAILY AS INSTRUCTED. DX CODE: Z99.357 (Patient not taking: Reported on 11/25/2019) 200 each 3  . BD PEN NEEDLE NANO 2ND GEN 32G X 4 MM MISC USE AS DIRECTED FIVE TIMES DAILY 200 each 3  . Blood Glucose Monitoring Suppl (ACCU-CHEK AVIVA PLUS) w/Device KIT Use to test blood sugar 6 times daily as instructed. Dx code: S17.793 1 kit 0  . Continuous Blood Gluc Receiver (FREESTYLE LIBRE 2 READER) DEVI 1 EACH BY DOES NOT APPLY ROUTE ONCE FOR 1 DOSE. 1 each 0  . Continuous Blood Gluc Sensor (FREESTYLE LIBRE 2 SENSOR) MISC 1 each by Does not apply route every 14 (fourteen) days. 6 each 3  . glucose blood (ACCU-CHEK AVIVA PLUS) test strip USE TO TEST BLOOD SUGAR 4 TIMES DAILY AS INSTRUCTED. DX CODE: O24.912 100 strip 11  . insulin aspart (NOVOLOG FLEXPEN) 100 UNIT/ML FlexPen INJECT 4-8 UNITS INTO THE SKIN 3 (THREE) TIMES DAILY WITH MEALS. 15 mL 3  . insulin glargine (LANTUS SOLOSTAR) 100 UNIT/ML Solostar Pen Inject 5 Units into the skin 2 (two) times daily. 15 mL 3   No current facility-administered medications on file prior to visit.   Allergies  Allergen Reactions  . Penicillins Rash    Has patient had a PCN reaction causing immediate rash, facial/tongue/throat swelling, SOB or lightheadedness with hypotension: Yes Has patient had a PCN reaction causing severe rash involving mucus membranes or skin necrosis: No Has patient had a PCN reaction that required hospitalization No Has patient had a PCN reaction occurring within the last 10 years: No If all of the  above answers are "NO", then may proceed with Cephalosporin use. Childhood allergy     Family History  Problem Relation Age of Onset  . Diabetes Father   . Kidney disease Father   . Diabetes Sister   . Diabetes Brother   . Breast cancer Neg Hx    PE: BP 112/78   Pulse 64   Wt 154 lb (69.9 kg)   BMI 25.63 kg/m  Wt Readings from Last 3 Encounters:  05/31/20 154 lb (69.9 kg)  11/25/19 154 lb 12.8 oz (70.2 kg)  05/16/19 150 lb 9.6 oz (68.3 kg)   Constitutional: normal weight, in NAD Eyes: PERRLA, EOMI, no exophthalmos ENT: moist mucous membranes, no thyromegaly, no cervical lymphadenopathy Cardiovascular: RRR, No MRG Respiratory: CTA B Gastrointestinal: abdomen soft, NT, ND, BS+ Musculoskeletal: no deformities, strength intact in all 4 Skin: moist,  warm, no rashes Neurological: no tremor with outstretched hands, DTR normal in all 4  ASSESSMENT: 1. DM1, uncontrolled, without long term complications, but with hypo-/hyperglycemia  We confirmed DM1: Component     Latest Ref Rng 02/09/2015  C-Peptide     0.80 - 3.90 ng/mL 0.24 (L)  Glucose, Fasting     65 - 99 mg/dL 64 (L)  Glutamic Acid Decarb Ab     <5 IU/mL 42 (H)  Pancreatic Islet Cell Antibody     < 5 JDF Units <5   2. HL  PLAN:  1. Patient with history of uncontrolled type 1 diabetes, on basal-bolus insulin regimen and freestyle libre CGM.  At last visit, only 56% of her values were in target range per review of her CGM downloads and sugars were increasing overnight after taking Lantus in the middle of the day.  She did try to move the entire dose of Lantus at night but this dropped her sugars too low, in the 50s.  She then tried to decrease the dose to 6 units and still taking it at night, but she continues to have low blood sugars so at last visit I advised her to split the Lantus into 5 units twice a day.  I advised her to take them 12 hours apart.  Also, her sugars were decreasing after breakfast, increasing  significantly after lunch, and remained controlled after dinner.  Therefore, I advised him to continue the same dose of NovoLog for dinner, increase the dose of NovoLog with lunch and decrease the dose with breakfast.  We also discussed about trying to limit the carbs that she was eating for lunch.  HbA1c at last visit was higher, at 8.1%. CGM interpretation: -At today's visit, we reviewed her CGM downloads: It appears that [place] 49% of values are in target range (goal >70%), while 40% are higher than 180 (goal <25%), and 11% are lower than 70 (goal <4%).  The calculated average blood sugar is 158.  The projected HbA1c for the next 3 months ([facility]) is 7.1%. -Reviewing the CGM trends, it appears that her sugars are lower after 10 AM in the morning and especially dropping before lunch and after dinner.  They are been mostly higher than low in the second half of the night. -She tells me that she tried to split the Lantus but this did not improve her sugars too much so she is now taking 10 units around 10 AM.  However, it looks like sugars are dropping after this dose so I again advised her to split the dose and discussed about how to vary the individual doses depending on her sugars in the next 12 hours.  Also, sugars after 3 in the morning are still high despite eliminating the sweets that she was eating along with the tea.  She is not always taking NovoLog 15 minutes before the team and I advised her to try to take it 15 to 30 minutes before this.  Otherwise, we will continue the same regimen for now. -Refilled her prescriptions for insulin for 3 months. - I suggested to:  Patient Instructions  Please split: - Lantus 5-6 units 2x a day  Continue: - Novolog:  5 units 15-30 before tea in am  7-8 units before lunch 4 units before dinner  Please return in 4 months.  - we checked her HbA1c: 7.9% (better). - advised to check sugars at different times of the day - 4x a day, rotating check times -  advised  for yearly eye exams >> she is UTD - return to clinic in 4 months   2. HL -Reviewed latest lipid panel: At goal: Lab Results  Component Value Date   CHOL 159 11/25/2019   HDL 53.30 11/25/2019   LDLCALC 91 11/25/2019   TRIG 71.0 11/25/2019   CHOLHDL 3 11/25/2019  -She is not on a statin.  We did discuss about changes in her diet by cutting down dairy and also reducing sweets at last visit. She reduced sweets.  Philemon Kingdom, MD PhD [facility] Endocrinology

## 2020-06-07 ENCOUNTER — Ambulatory Visit: Payer: 59

## 2020-08-26 ENCOUNTER — Other Ambulatory Visit: Payer: Self-pay | Admitting: Internal Medicine

## 2020-08-26 DIAGNOSIS — E1065 Type 1 diabetes mellitus with hyperglycemia: Secondary | ICD-10-CM

## 2020-10-05 ENCOUNTER — Ambulatory Visit: Payer: 59 | Admitting: Internal Medicine

## 2020-10-05 NOTE — Progress Notes (Deleted)
Patient ID: Theresa Koch, female   DOB: 1980/11/26, 40 y.o.   MRN: [id number]  This visit occurred during the SARS-CoV-2 public health emergency.  Safety protocols were in place, including screening questions prior to the visit, additional usage of staff PPE, and extensive cleaning of exam room while observing appropriate contact time as indicated for disinfecting solutions.   HPI: Theresa Koch is a 40 y.o.-year-old female, initially referred by Dr. Synetta Shadow, returning for f/u for DM1, dx in 2011, but at DM1 in 02/2015, insulin-dependent since 2013, uncontrolled, without long term complications. Last visit 6 months ago.  BCBS now.  Interim history: No increased urination, blurry vision, nausea. HAs resolved after changing glasses. She is preparing for 3 months trip to [place] and [place] starting next month.  Reviewed HbA1c levels: Lab Results  Component Value Date   HGBA1C 7.9 (A) 05/31/2020   HGBA1C 8.1 (A) 11/25/2019   HGBA1C 8.0 (A) 01/11/2019   She is on: - Lantus 6 units at night >> moved to am >> 10 >> 8 >> 6 >> 8 units in a.m. (tried to take it at night >> lows in am) >> 5 units twice a day >> 10 units at 10 am - Novolog: 5 units before tea in am  7-8 units before lunch  (try to decrease carbs with this meal) 4 units before dinner We stopped Metformin XR 500 mg 3x a day with meals. We stopped Glipizide XL 5 mg in am.  Pt.checks her sugar is more than 4 times a day with her libre CGM:   Previously:   Previously:  Lowest sugar was 40 >> 47 (Libre) >> 50s >> 50s; she has hypoglycemia awareness in the 75s. Highest sugar was 277 >> 264 >> 300 >> 300s.  Glucometer: One Touch Ultra  Pt's meals are: - Breakfast: tea with milk + cookies >> now only tea - Lunch: salad, soup, cheese  - Dinner: egg, cheese, meat, sometimes cereals - after dinner: milk >> stopped since last OV  No CKD; latest BUN/creatinine: Lab Results  Component Value Date    BUN 15 11/25/2019   Lab Results  Component Value Date   CREATININE 0.61 11/25/2019   No MAU: Lab Results  Component Value Date   MICRALBCREAT 1.1 11/25/2019   MICRALBCREAT 1.1 01/11/2019   MICRALBCREAT 0.9 04/10/2017   MICRALBCREAT 0.7 05/23/2015  She is not on ACE inhibitor/ARB.  + Mild HL: Lab Results  Component Value Date   CHOL 159 11/25/2019   HDL 53.30 11/25/2019   LDLCALC 91 11/25/2019   TRIG 71.0 11/25/2019   CHOLHDL 3 11/25/2019  She is not on a statin.  - last eye exam was in 10/2019: No DR reportedly. New glasses, but has blurry vision with them. - no numbness and tingling in her feet.  Latest TSH was reviewed and this was normal: Lab Results  Component Value Date   TSH 1.88 11/25/2019   ROS: Constitutional: no weight gain/no weight loss, no fatigue, no subjective hyperthermia, no subjective hypothermia Eyes: no blurry vision, no xerophthalmia ENT: no sore throat, no nodules palpated in neck, no dysphagia, no odynophagia, no hoarseness Cardiovascular: no CP/no SOB/no palpitations/no leg swelling Respiratory: no cough/no SOB/no wheezing Gastrointestinal: no N/no V/no D/no C/no acid reflux Musculoskeletal: no muscle aches/no joint aches Skin: no rashes, no hair loss Neurological: no tremors/no numbness/no tingling/no dizziness, no more HAs after changing glasses  I reviewed pt's medications, allergies, PMH, social hx, family hx, and changes  were documented in the history of present illness. Otherwise, unchanged from my initial visit note.  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 DX CODE: O24.912 200 each 3   BD PEN NEEDLE NANO 2ND GEN 32G X 4 MM MISC USE AS DIRECTED FIVE TIMES DAILY 400 each 1   Blood Glucose Monitoring Suppl (ACCU-CHEK AVIVA PLUS) w/Device KIT Use to test blood sugar 6 times daily as instructed. Dx code: O24.912 1 kit 0   Continuous Blood Gluc Receiver (FREESTYLE LIBRE 2 READER) DEVI 1 EACH BY DOES NOT APPLY ROUTE ONCE FOR 1 DOSE. 1 each 0   Continuous Blood Gluc Sensor (FREESTYLE LIBRE 2 SENSOR) MISC 1 each by Does not apply route every 14 (fourteen) days. 6 each 3   glucose blood (ACCU-CHEK AVIVA PLUS) test strip USE TO TEST BLOOD SUGAR 4 TIMES DAILY AS INSTRUCTED. DX CODE: O24.912 100 strip 11   insulin aspart (NOVOLOG FLEXPEN) 100 UNIT/ML FlexPen INJECT up to 20 UNITS INTO THE SKIN DAILY AS ADVISED 30 mL 3   insulin glargine (LANTUS SOLOSTAR) 100 UNIT/ML Solostar Pen Inject 10-12 Units into the skin daily. 30 mL 3   No current facility-administered medications on file prior to visit.   Allergies  Allergen Reactions   Penicillins Rash    Has patient had a PCN reaction causing immediate rash, facial/tongue/throat swelling, SOB or lightheadedness with hypotension: Yes Has patient had a PCN reaction causing severe rash involving mucus membranes or skin necrosis: No Has patient had a PCN reaction that required hospitalization No Has patient had a PCN reaction occurring within the last 10 years: No If all of the above answers are "NO", then may proceed with Cephalosporin use. Childhood allergy      Family History  Problem Relation Age of Onset   Diabetes Father    Kidney disease Father    Diabetes Sister    Diabetes Brother    Breast cancer Neg Hx    PE: There were no vitals taken for this visit. Wt Readings from Last 3 Encounters:  05/31/20 154 lb (69.9 kg)  11/25/19 154 lb 12.8 oz (70.2 kg)  05/16/19 150 lb 9.6 oz (68.3 kg)   Constitutional: normal weight, in NAD Eyes: PERRLA, EOMI, no exophthalmos ENT: moist mucous membranes, no thyromegaly, no cervical lymphadenopathy Cardiovascular: RRR, No MRG Respiratory: CTA B Gastrointestinal: abdomen soft, NT, ND, BS+ Musculoskeletal: no deformities, strength intact in all 4 Skin: moist, warm, no rashes Neurological: no tremor with outstretched hands, DTR normal in all 4  ASSESSMENT: 1. DM1, uncontrolled, without long  term complications, but with hypo-/hyperglycemia  We confirmed DM1: Component     Latest Ref Rng 02/09/2015  C-Peptide     0.80 - 3.90 ng/mL 0.24 (L)  Glucose, Fasting     65 - 99 mg/dL 64 (L)  Glutamic Acid Decarb Ab     <5 IU/mL 42 (H)  Pancreatic Islet Cell Antibody     < 5 JDF Units <5   2. HL  PLAN:  1. Patient with   history of uncontrolled type 1 diabetes, on basal-bolus insulin regimen and freestyle libre CGM.  At last visit, only 56% of her values were in target range per review of her CGM downloads and sugars were increasing overnight after taking Lantus in the middle of the day.  She did try to move the entire dose of Lantus at night but this dropped her sugars too low, in the 50s.  She then tried to decrease the dose to 6 units and still taking it at night, but she continues to have low blood sugars so at last visit I advised her to split the Lantus into 5 units twice a day.  I advised her to take them 12 hours apart.  Also, her sugars were decreasing after breakfast, increasing significantly after lunch, and remained controlled after dinner.  Therefore, I advised him to continue the same dose of  NovoLog for dinner, increase the dose of NovoLog with lunch and decrease the dose with breakfast.  We also discussed about trying to limit the carbs that she was eating for lunch.  HbA1c at last visit was higher, at 8.1%. CGM interpretation: -At today's visit, we reviewed her CGM downloads: It appears that *** of values are in target range (goal >70%), while *** are higher than 180 (goal <25%), and *** are lower than 70 (goal <4%).  The calculated average blood sugar is ***.  The projected HbA1c for the next 3 months ([facility]) is ***. -Reviewing the CGM trends, ***  -Reviewing the CGM trends, it appears that her sugars are lower after 10 AM in the morning and especially dropping before lunch and after dinner.  They are been mostly higher than low in the second half of the night. -She tells me that she tried to split the Lantus but this did not improve her sugars too much so she is now taking 10 units around 10 AM.  However, it looks like sugars are dropping after this dose so I again advised her to split the dose and discussed about how to vary the individual doses depending on her sugars in the next 12 hours.  Also, sugars after 3 in the morning are still high despite eliminating the sweets that she was eating along with the tea.  She is not always taking NovoLog 15 minutes before the team and I advised her to try to take it 15 to 30 minutes before this.  Otherwise, we will continue the same regimen for now. -Refilled her prescriptions for insulin for 3 months.   - I suggested to:  Patient Instructions  Please continue: - Lantus 5-6 units 2x a day - Novolog:  5 units 15-30 before tea in am  7-8 units before lunch 4 units before dinner  Please return in 4 months.  - we checked her HbA1c: 7%  - advised to check sugars at different times of the day - 1x a day, rotating check times - advised for yearly eye exams >> she is UTD - return to clinic in 3-4 months  2. HL -Reviewed latest lipid panel:  All fractions at goal Lab Results  Component Value Date   CHOL 159 11/25/2019   HDL 53.30 11/25/2019   LDLCALC 91 11/25/2019   TRIG 71.0 11/25/2019   CHOLHDL 3 11/25/2019  -She is not on a statin.  We discussed about improving diet at last visit by cutting down dairy and reducing sweets.  Philemon Kingdom, MD PhD [facility] Endocrinology

## 2020-11-02 ENCOUNTER — Encounter: Payer: Self-pay | Admitting: Internal Medicine

## 2020-11-02 ENCOUNTER — Other Ambulatory Visit: Payer: Self-pay

## 2020-11-02 ENCOUNTER — Ambulatory Visit (INDEPENDENT_AMBULATORY_CARE_PROVIDER_SITE_OTHER): Payer: 59 | Admitting: Internal Medicine

## 2020-11-02 VITALS — BP 108/70 | HR 68 | Ht 65.0 in | Wt 156.6 lb

## 2020-11-02 DIAGNOSIS — E1065 Type 1 diabetes mellitus with hyperglycemia: Secondary | ICD-10-CM

## 2020-11-02 LAB — COMPLETE METABOLIC PANEL WITH GFR
AG Ratio: 1.4 (calc) (ref 1.0–2.5)
ALT: 5 U/L — ABNORMAL LOW (ref 6–29)
AST: 14 U/L (ref 10–30)
Albumin: 4.2 g/dL (ref 3.6–5.1)
Alkaline phosphatase (APISO): 45 U/L (ref 31–125)
BUN: 20 mg/dL (ref 7–25)
CO2: 29 mmol/L (ref 20–32)
Calcium: 9 mg/dL (ref 8.6–10.2)
Chloride: 104 mmol/L (ref 98–110)
Creat: 0.66 mg/dL (ref 0.50–0.99)
Globulin: 2.9 g/dL (calc) (ref 1.9–3.7)
Glucose, Bld: 61 mg/dL — ABNORMAL LOW (ref 65–99)
Potassium: 4.2 mmol/L (ref 3.5–5.3)
Sodium: 138 mmol/L (ref 135–146)
Total Bilirubin: 0.2 mg/dL (ref 0.2–1.2)
Total Protein: 7.1 g/dL (ref 6.1–8.1)
eGFR: 114 mL/min/{1.73_m2} (ref >=60)

## 2020-11-02 LAB — MICROALBUMIN / CREATININE URINE RATIO
Creatinine,U: 42.9 mg/dL
Microalb Creat Ratio: 1.6 mg/g (ref 0.0–30.0)
Microalb, Ur: <0.7 mg/dL (ref 0.0–1.9)

## 2020-11-02 LAB — POCT GLYCOSYLATED HEMOGLOBIN (HGB A1C): Hemoglobin A1C: 7.7 % — AB (ref 4.0–5.6)

## 2020-11-02 LAB — LIPID PANEL
Cholesterol: 174 mg/dL (ref 0–200)
HDL: 54 mg/dL (ref >39.00)
LDL Cholesterol: 109 mg/dL — ABNORMAL HIGH (ref 0–99)
NonHDL: 120.31
Total CHOL/HDL Ratio: 3
Triglycerides: 58 mg/dL (ref 0.0–149.0)
VLDL: 11.6 mg/dL (ref 0.0–40.0)

## 2020-11-02 LAB — TSH: TSH: 2.03 u[IU]/mL (ref 0.35–5.50)

## 2020-11-02 NOTE — Progress Notes (Signed)
Patient ID: Theresa Koch, female   DOB: [birth year], 40 y.o.   MRN: [id number]  This visit occurred during the SARS-CoV-2 public health emergency.  Safety protocols were in place, including screening questions prior to the visit, additional usage of staff PPE, and extensive cleaning of exam room while observing appropriate contact time as indicated for disinfecting solutions.   HPI: Theresa Koch is a 39 y.o.-year-old female, initially referred by Dr. Synetta Shadow, returning for f/u for DM1, dx in 2011, but at DM1 in 02/2015, insulin-dependent since 2013, uncontrolled, without long term complications. Last visit ~6 months ago.  BCBS now.  Interim history: No increased urination, blurry vision, nausea, chest pain.  She continues to have some headaches, but not as frequent as before.  Reviewed HbA1c levels: Lab Results  Component Value Date   HGBA1C 7.9 (A) 05/31/2020   HGBA1C 8.1 (A) 11/25/2019   HGBA1C 8.0 (A) 01/11/2019   She is on: - Lantus 6 units at night >> ... 5 units twice a day >> 10 units at 10 am - Novolog: 5 >> 6 units before tea in am  7-8 >> 5 units before lunch  (try to decrease carbs with this meal) 4 >> 5 units before dinner We stopped Metformin XR 500 mg 3x a day with meals. We stopped Glipizide XL 5 mg in am.  Pt.checks her sugar is more than 4 times a day with her libre CGM:    Prev.:   Previously:   Lowest sugar was 40 >> ... 56s; she has hypoglycemia awareness in the 66s. Highest sugar was 300s.  Glucometer: One Touch Ultra  Pt's meals are: - Breakfast: tea with milk + cookies >> now only tea - Lunch: salad, soup, cheese  - Dinner: egg, cheese, meat, sometimes cereals - after dinner: milk >> stopped since last OV  No CKD; latest BUN/creatinine: Lab Results  Component Value Date   BUN 15 11/25/2019   Lab Results  Component Value Date   CREATININE 0.61 11/25/2019   No MAU: Lab Results  Component Value Date    MICRALBCREAT 1.1 11/25/2019   MICRALBCREAT 1.1 01/11/2019   MICRALBCREAT 0.9 04/10/2017   MICRALBCREAT 0.7 05/23/2015  She is not on ACE inhibitor/ARB.  + Mild HL: Lab Results  Component Value Date   CHOL 159 11/25/2019   HDL 53.30 11/25/2019   LDLCALC 91 11/25/2019   TRIG 71.0 11/25/2019   CHOLHDL 3 11/25/2019  She is not on a statin.  - last eye exam was in 10/2019: No DR reportedly. Coming up in 01/2020.  - no numbness and tingling in her feet.  Latest TSH was reviewed and this was normal: Lab Results  Component Value Date   TSH 1.88 11/25/2019   ROS: + see HPI  I reviewed pt's medications, allergies, PMH, social hx, family hx, and changes were documented in the history of present illness. Otherwise, unchanged from my initial visit note.  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 DX CODE: O24.912 200 each 3   BD PEN NEEDLE NANO 2ND GEN 32G X 4 MM MISC USE AS DIRECTED FIVE TIMES DAILY 400 each 1   Blood Glucose Monitoring Suppl (ACCU-CHEK AVIVA PLUS) w/Device KIT Use to test blood sugar 6 times daily as instructed. Dx code: O24.912 1 kit 0    Continuous Blood Gluc Receiver (FREESTYLE LIBRE 2 READER) DEVI 1 EACH BY DOES NOT APPLY ROUTE ONCE FOR 1 DOSE. 1 each 0   Continuous Blood Gluc Sensor (FREESTYLE LIBRE 2 SENSOR) MISC 1 each by Does not apply route every 14 (fourteen) days. 6 each 3   glucose blood (ACCU-CHEK AVIVA PLUS) test strip USE TO TEST BLOOD SUGAR 4 TIMES DAILY AS INSTRUCTED. DX CODE: O24.912 100 strip 11   insulin aspart (NOVOLOG FLEXPEN) 100 UNIT/ML FlexPen INJECT up to 20 UNITS INTO THE SKIN DAILY AS ADVISED 30 mL 3   insulin glargine (LANTUS SOLOSTAR) 100 UNIT/ML Solostar Pen Inject 10-12 Units into the skin daily. 30 mL 3   No current facility-administered medications on file prior to visit.   Allergies  Allergen Reactions   Penicillins Rash    Has patient had a PCN reaction causing immediate rash, facial/tongue/throat swelling, SOB or lightheadedness with hypotension: Yes Has patient had a PCN reaction causing severe rash involving mucus membranes or skin necrosis: No Has patient had a PCN reaction that required hospitalization No Has patient had a PCN reaction occurring within the last 10 years: No If all of the above answers are "NO", then may proceed with Cephalosporin use. Childhood allergy     Family History  Problem Relation Age of Onset   Diabetes Father    Kidney disease Father    Diabetes Sister    Diabetes Brother    Breast cancer Neg Hx    PE: BP 108/70 (BP Location: Right Arm, Patient Position: Sitting, Cuff Size: Normal)   Pulse 68   Ht 5' 5"  (1.651 m)   Wt 156 lb 9.6 oz (71 kg)   SpO2 99%   BMI 26.06 kg/m  Wt Readings from Last 3 Encounters:  11/02/20 156 lb 9.6 oz (71 kg)  05/31/20 154 lb (69.9 kg)  11/25/19 154 lb 12.8 oz (70.2 kg)   Constitutional: normal weight, in NAD Eyes: PERRLA, EOMI, no exophthalmos ENT: moist mucous membranes, no thyromegaly, no cervical lymphadenopathy Cardiovascular: RRR, No MRG Respiratory: CTA B Gastrointestinal: abdomen soft, NT, ND,  BS+ Musculoskeletal: no deformities, strength intact in all 4 Skin: moist, warm, no rashes Neurological: no tremor with outstretched hands, DTR normal in all 4  ASSESSMENT: 1. DM1, uncontrolled, without long term complications, but with hypo-/hyperglycemia  We confirmed DM1: Component     Latest Ref Rng 02/09/2015  C-Peptide     0.80 - 3.90 ng/mL 0.24 (L)  Glucose, Fasting     65 - 99 mg/dL 64 (L)  Glutamic Acid Decarb Ab     <5 IU/mL 42 (H)  Pancreatic Islet Cell Antibody     < 5 JDF Units <5   2. HL  PLAN:  1. Patient with history of uncontrolled type 1 diabetes, on basal-bolus insulin regimen, with still poor control.  She is very sensitive to low doses of insulin.  At this visit, she continues on the freestyle libre CGM but has been loss of data as she cannot get her sensors to communicate with the receiver.  I advised her to try to use her phone to read the sensor.  Also, to call [employer] to get replacement for the sensors that she cannot use. CGM interpretation: -At today's visit, we reviewed her CGM downloads: It appears that 45% of values are in target range (goal >70%), while 41% are higher than 180 (goal <25%), and 14% are lower than 70 (goal <4%).  The calculated average blood sugar is 164.  -Reviewing the CGM trends, it appears that her sugars are quite low overnight and upon questioning, these are due to correction of high blood sugars at bedtime.  She corrects with approximately 3 units of insulin if the sugars are higher than upper 200s.  I advised her to not use more than 1 to 2 units in this case.  However, ideally, she would not have such high blood sugars after dinner.  She is using a lower dose of NovoLog before dinner than advised at last visit so at this visit I advised her that she may need to use a higher dose.  Also, she is using a lower dose of insulin before lunch and sugars after lunch are high so I advised him to increase this, also.  Her sugars in the morning  increase abruptly but upon questioning, this is because she overcorrects her lows overnight.  We discussed about the 15-15 rule of correction of hypoglycemia and I advised her not to take more than 15 to 30 g of carbs as needed for correction, to wait 15 minutes, and then to check with her glucometer rather than the sensor to see if this amount of carbs was enough for her.  It looks like the 6 units of insulin that she is taking with her Tea are accurate. -To avoid further low blood sugars overnight, I also advised her to decrease her Lantus dose in the morning - I suggested to:  Patient Instructions  Please decrease: - Lantus 8 units at 10 am  Use: - Novolog:  6 units 15-30 before tea in am  6-7 units before lunch 4-5 units before dinner  At night, do not use more than 1-2 units NovoLog for correction.  When you correct a low, do not use more then 15-30 g carbs.  Please return in 4 months.  - we checked her HbA1c: 7.7% (slightly better) - advised to check sugars at different times of the day - 4x a day, rotating check times - advised for yearly eye exams >> she is UTD - will recheck annual labs today (fasting, she just had fatigue this morning) - return to clinic in 4 months   2. HL -Reviewed latest lipid panel: All fractions at goal: Lab Results  Component Value Date   CHOL 159 11/25/2019   HDL 53.30 11/25/2019   LDLCALC 91 11/25/2019   TRIG 71.0 11/25/2019   CHOLHDL 3 11/25/2019  -She is not on a statin.  We discussed about improving diet by cutting down dairy and reducing sweets. -She is due for another lipid panel.  We will recheck this today.  Component     Latest Ref Rng & Units 11/02/2020  Glucose     65 - 99  mg/dL 61 (L)  BUN     7 - 25 mg/dL 20  Creatinine     0.50 - 0.99 mg/dL 0.66  eGFR     > OR = 60 mL/min/1.3m 114  BUN/Creatinine Ratio     6 - 22 (calc) NOT APPLICABLE  Sodium     1125- 146 mmol/L 138  Potassium     3.5 - 5.3 mmol/L 4.2  Chloride      98 - 110 mmol/L 104  CO2     20 - 32 mmol/L 29  Calcium     8.6 - 10.2 mg/dL 9.0  Total Protein     6.1 - 8.1 g/dL 7.1  Albumin MSPROF     3.6 - 5.1 g/dL 4.2  Globulin     1.9 - 3.7 g/dL (calc) 2.9  AG Ratio     1.0 - 2.5 (calc) 1.4  Total Bilirubin     0.2 - 1.2 mg/dL 0.2  Alkaline phosphatase (APISO)     31 - 125 U/L 45  AST     10 - 30 U/L 14  ALT     6 - 29 U/L 5 (L)  Cholesterol     0 - 200 mg/dL 174  Triglycerides     0.0 - 149.0 mg/dL 58.0  HDL Cholesterol     >39.00 mg/dL 54.00  VLDL     0.0 - 40.0 mg/dL 11.6  LDL (calc)     0 - 99 mg/dL 109 (H)  Total CHOL/HDL Ratio      3  NonHDL      120.31  Microalb, Ur     0.0 - 1.9 mg/dL <0.7  Creatinine,U     mg/dL 42.9  MICROALB/CREAT RATIO     0.0 - 30.0 mg/g 1.6  Hemoglobin A1C     4.0 - 5.6 % 7.7 (A)  TSH     0.35 - 5.50 uIU/mL 2.03   LDL cholesterol is higher than before, slightly above goal.  Continue dietary changes for now.  The rest of the labs are at goal, with a slightly low glucose.  CPhilemon Kingdom MD PhD [facility]

## 2020-11-02 NOTE — Patient Instructions (Addendum)
Please decrease: - Lantus 8 units at 10 am  Use: - Novolog:  6 units 15-30 before tea in am  6-7 units before lunch 4-5 units before dinner  At night, do not use more than 1-2 units NovoLog for correction.  When you correct a low, do not use more then 15-30 g carbs.  Please return in 4 months.

## 2020-12-05 ENCOUNTER — Other Ambulatory Visit: Payer: Self-pay | Admitting: Internal Medicine

## 2021-01-15 ENCOUNTER — Other Ambulatory Visit (HOSPITAL_COMMUNITY): Payer: Self-pay

## 2021-01-15 ENCOUNTER — Telehealth: Payer: Self-pay | Admitting: Pharmacy Technician

## 2021-01-15 DIAGNOSIS — E1065 Type 1 diabetes mellitus with hyperglycemia: Secondary | ICD-10-CM

## 2021-01-15 MED ORDER — INSULIN LISPRO (1 UNIT DIAL) 100 UNIT/ML (KWIKPEN): PEN_INJECTOR | SUBCUTANEOUS | 0 refills | Status: DC

## 2021-01-15 NOTE — Telephone Encounter (Signed)
Rx sent to preferred pharmacy.

## 2021-01-15 NOTE — Telephone Encounter (Signed)
Patient Advocate Encounter   Received notification from CoverMyMeds that prior authorization for Novolog Flexpen is required by his/her insurance [employer].  Humalog is covered.

## 2021-01-15 NOTE — Telephone Encounter (Signed)
We can work with Humalog, or better, with Lyumjev.  If Lyumjev approved, patient needs to inject this at the start of the meal, rather than 15 minutes before.

## 2021-01-15 NOTE — Telephone Encounter (Signed)
OK, then Humalog.

## 2021-01-15 NOTE — Addendum Note (Signed)
Addended by: Kenyon Ana on: 01/15/2021 02:16 PM   Modules accepted: Orders

## 2021-02-22 ENCOUNTER — Other Ambulatory Visit: Payer: Self-pay | Admitting: Internal Medicine

## 2021-02-22 DIAGNOSIS — E1065 Type 1 diabetes mellitus with hyperglycemia: Secondary | ICD-10-CM

## 2021-02-25 ENCOUNTER — Other Ambulatory Visit (HOSPITAL_COMMUNITY): Payer: Self-pay

## 2021-02-25 ENCOUNTER — Telehealth: Payer: Self-pay | Admitting: Pharmacy Technician

## 2021-02-25 DIAGNOSIS — E1065 Type 1 diabetes mellitus with hyperglycemia: Secondary | ICD-10-CM

## 2021-02-25 NOTE — Telephone Encounter (Signed)
Patient Advocate Encounter  Received notification from [name] that prior authorization for LANTUS SOLOSTAR is required.   PA NOT SUBMITTED on 2.20.23 SUBSTITUTION BASAGLAR COVERED WITH TEST BILLING OF $25 COPAY.   Received notification from [facility] that prior authorization for NOVOLOG [facility] is required.  PA NOT SUBMITTED on 2.20.23 SUBSTITUTION HUMALOG KWIKPEN WAS ALLOWED AND FILLED ON 2.17.23   [facility] will continue to follow  Theresa Koch, CPhT Patient Advocate  Endocrinology Phone: [phone] Fax:  [phone]

## 2021-03-12 MED ORDER — BASAGLAR KWIKPEN 100 UNIT/ML ~~LOC~~ SOPN: 5.0000 [IU] | PEN_INJECTOR | Freq: Two times a day (BID) | SUBCUTANEOUS | 0 refills | Status: DC

## 2021-03-12 NOTE — Addendum Note (Signed)
Addended by: Kenyon Ana on: 03/12/2021 04:48 PM ? ? Modules accepted: Orders ? ?

## 2021-03-12 NOTE — Telephone Encounter (Signed)
New rx sent to preferred pharmacy. °

## 2021-03-12 NOTE — Telephone Encounter (Signed)
We can use Basaglar at the same dose. ?

## 2021-03-15 ENCOUNTER — Other Ambulatory Visit: Payer: Self-pay

## 2021-03-15 ENCOUNTER — Encounter: Payer: Self-pay | Admitting: Internal Medicine

## 2021-03-15 ENCOUNTER — Ambulatory Visit (INDEPENDENT_AMBULATORY_CARE_PROVIDER_SITE_OTHER): Payer: Self-pay | Admitting: Internal Medicine

## 2021-03-15 VITALS — BP 120/74 | HR 64 | Ht 65.0 in | Wt 154.6 lb

## 2021-03-15 DIAGNOSIS — E7800 Pure hypercholesterolemia, unspecified: Secondary | ICD-10-CM

## 2021-03-15 DIAGNOSIS — E1065 Type 1 diabetes mellitus with hyperglycemia: Secondary | ICD-10-CM

## 2021-03-15 HISTORY — DX: Pure hypercholesterolemia, unspecified: E78.00

## 2021-03-15 LAB — POCT GLYCOSYLATED HEMOGLOBIN (HGB A1C): Hemoglobin A1C: 8.2 % — AB (ref 4.0–5.6)

## 2021-03-15 NOTE — Patient Instructions (Addendum)
Please change: ?- Lantus/Basaglar 6 units at 10 am and 3 units at bedtime ?- Novolog/Humalog:  ?4-6 units 15-30 before tea in am  ?6-7 units before lunch ?4-5 units before dinner ?At night, do not use more than 1-2 units NovoLog for correction. ? ?When you correct a low, do not use more then 15-30 g carbs. ? ?Please return in 4 months. ?

## 2021-03-15 NOTE — Progress Notes (Signed)
Patient ID: Theresa Koch, female   DOB: October 26, 1980, 41 y.o.   MRN: [id number]  This visit occurred during the SARS-CoV-2 public health emergency.  Safety protocols were in place, including screening questions prior to the visit, additional usage of staff PPE, and extensive cleaning of exam room while observing appropriate contact time as indicated for disinfecting solutions.   HPI: Theresa Koch is a 41 y.o.-year-old female, initially referred by Dr. Synetta Shadow, returning for f/u for DM1, dx in 2011, but at DM1 in 02/2015, insulin-dependent since 2013, uncontrolled, without long term complications. Last visit 5 months ago.  BCBS now.  She is late 17 minutes for her 20-minute appointment.  Interim history: No increased urination, blurry vision, nausea, chest pain.   However, she restarted to have headaches, not always related to hypoglycemia.  She sometimes has to stay the entire day in bed. She had to change her insurance since last visit and they are covering other insulins: Basaglar and Humalog of Lantus and NovoLog.  Also, they do not cover her CGM, for which she applied $25 for 3 months in the past but now she needs to pay $75 a month.  Reviewed HbA1c levels: Lab Results  Component Value Date   HGBA1C 7.7 (A) 11/02/2020   HGBA1C 7.9 (A) 05/31/2020   HGBA1C 8.1 (A) 11/25/2019   She is on: (Changes made at last visit are shown in bold) - Lantus 6 units at night >> ... 5 units twice a day >> 10 units >> 8 units at 10 am - Novolog: 6 units 15-30 before tea in am  6-7 units before lunch 4-5 units before dinner At night, do not use more than 1-2 units NovoLog for correction.  We stopped Metformin XR 500 mg 3x a day with meals. We stopped Glipizide XL 5 mg in am.  Pt.checks her sugar is more than 4 times a day with her libre CGM:   Previously:    Prev.:   Lowest sugar was 40 >> ... 50s >> 28; she has hypoglycemia awareness in the 60s. Highest sugar was  300s>> 300s.  Glucometer: One Touch Ultra  Pt's meals are: - Breakfast: tea with milk + cookies >> now only tea - Lunch: salad, soup, cheese  - Dinner: egg, cheese, meat, sometimes cereals - after dinner: milk >> stopped since last OV  No CKD; latest BUN/creatinine: Lab Results  Component Value Date   BUN 20 11/02/2020   Lab Results  Component Value Date   CREATININE 0.66 11/02/2020   No MAU: Lab Results  Component Value Date   MICRALBCREAT 1.6 11/02/2020   MICRALBCREAT 1.1 11/25/2019   MICRALBCREAT 1.1 01/11/2019   MICRALBCREAT 0.9 04/10/2017   MICRALBCREAT 0.7 05/23/2015  She is not on ACE inhibitor/ARB.  + Mild HL: Lab Results  Component Value Date   CHOL 174 11/02/2020   HDL 54.00 11/02/2020   LDLCALC 109 (H) 11/02/2020   TRIG 58.0 11/02/2020   CHOLHDL 3 11/02/2020  She is not on a statin.  - last eye exam was in 01/2021: No DR reportedly.   - no numbness and tingling in her feet.  Latest TSH was reviewed and this was normal: Lab Results  Component Value Date   TSH 2.03 11/02/2020   ROS: + see HPI  I reviewed pt's medications, allergies, PMH, social hx, family hx, and changes were documented in the history of present illness. Otherwise, unchanged from my initial visit note.  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 DX CODE: O24.912 200 each 3   BD PEN NEEDLE NANO 2ND GEN 32G X 4 MM MISC USE AS DIRECTED FIVE TIMES DAILY 400 each 1   Blood Glucose Monitoring Suppl (ACCU-CHEK AVIVA PLUS) w/Device KIT Use to test blood sugar 6 times daily as instructed. Dx code: O24.912 1 kit 0   Continuous Blood Gluc Receiver (FREESTYLE LIBRE 2 READER) DEVI 1 EACH BY DOES NOT APPLY ROUTE ONCE FOR 1 DOSE. 1 each 0   Continuous Blood Gluc Sensor (FREESTYLE LIBRE 2 SENSOR) MISC CHANGE EVERY 14 DAYS 6 each 3   glucose blood (ACCU-CHEK AVIVA PLUS) test strip USE TO TEST BLOOD SUGAR 4 TIMES DAILY AS INSTRUCTED. DX CODE: O24.912 100 strip 11   Insulin Glargine (BASAGLAR KWIKPEN) 100 UNIT/ML Inject 5 Units into the skin 2 (two) times daily. 15 mL 0   insulin lispro (HUMALOG KWIKPEN) 100 UNIT/ML KwikPen Inject up to 20 units into the skin daily as advised. 15 mL 0   No current facility-administered medications on file prior to visit.   Allergies  Allergen Reactions   Penicillins Rash    Has patient had a PCN reaction causing immediate rash, facial/tongue/throat swelling, SOB or lightheadedness with hypotension: Yes Has patient had a PCN reaction causing severe rash involving mucus membranes or skin necrosis: No Has patient had a PCN reaction that required hospitalization No Has patient had a PCN reaction occurring within the last 10 years: No If all of the above answers are "NO", then may proceed with Cephalosporin use. Childhood allergy     Family History  Problem Relation Age of Onset   Diabetes Father    Kidney disease Father    Diabetes Sister    Diabetes Brother    Breast cancer Neg Hx    PE: BP 120/74 (BP Location: Right Arm, Patient Position: Sitting, Cuff Size: Normal)    Pulse 64    Ht 5' 5"  (1.651 m)    Wt 154 lb 9.6 oz  (70.1 kg)    SpO2 99%    BMI 25.73 kg/m  Wt Readings from Last 3 Encounters:  03/15/21 154 lb 9.6 oz (70.1 kg)  11/02/20 156 lb 9.6 oz (71 kg)  05/31/20 154 lb (69.9 kg)   Constitutional: normal weight, in NAD Eyes: PERRLA, EOMI, no exophthalmos ENT: moist mucous membranes, no thyromegaly, no cervical lymphadenopathy Cardiovascular: RRR, No MRG Respiratory: CTA B Musculoskeletal: no deformities, strength intact in all 4 Skin: moist, warm, no rashes Neurological: no tremor with outstretched hands, DTR normal in all 4 Diabetic  Foot Exam - Simple   Simple Foot Form Diabetic Foot exam was performed with the following findings: Yes 03/15/2021  9:47 AM  Visual Inspection No deformities, no ulcerations, no other skin breakdown bilaterally: Yes Sensation Testing Intact to touch and monofilament testing bilaterally: Yes Pulse Check Posterior Tibialis and Dorsalis pulse intact bilaterally: Yes Comments     ASSESSMENT: 1. DM1, uncontrolled, without long term complications, but with hypo-/hyperglycemia  We confirmed DM1: Component     Latest Ref Rng 02/09/2015  C-Peptide     0.80 - 3.90 ng/mL 0.24 (L)  Glucose, Fasting     65 - 99 mg/dL 64 (L)  Glutamic Acid Decarb Ab     <5 IU/mL 42 (H)  Pancreatic Islet Cell Antibody     < 5 JDF Units <5   2. HL  PLAN:  1. Patient with history of uncontrolled type 1 diabetes on basal/bolus insulin regimen with still poor control.  She is insulin sensitive so we are using lower doses of insulin for her.  She continues on a freestyle libre CGM.  At last visit, reviewing the CGM trends, her sugars were quite low overnight and upon questioning, these were due to correction of high blood sugars at bedtime.  She was correcting with approximately 3 units of insulin if the sugars are higher than upper 200s.  I advised her to not use more than 1-2 units in this case.,  However, we discussed about increasing the dose of NovoLog before dinner and, since sugars  were higher after lunch, I advised her to also increase the insulin with lunch.  Since her sugars were increasing abruptly after correcting her lows, we discussed about how to correct the lows using the 15-15 rule.  I also advised her to check the blood sugars with a glucometer when correcting a low, rather than with a CGM due to the delay between glucose increasing in blood and in the interstitial fluid.  Since sugars after her tea in the morning were at goal, we did not change her NovoLog dose before this (6 units) since sugars were low overnight, I advised her to decrease her Lantus dose in the morning.   -Since last visit, she changed insurances and has to [place] to change her insulins.  I advised her that this is not a problem, but she may be more sensitive to Humalog. CGM interpretation: -At today's visit, we reviewed her CGM downloads: It appears that 58% of values are in target range (goal >70%), while 35% are higher than 180 (goal <25%), and 7% are lower than 70 (goal <4%).  The calculated average blood sugar is 159.  The projected HbA1c for the next 3 months ([facility]MI) is 7.1%. -Reviewing the CGM trends, sugars appear to be increasing overnight, with a peak around 7-8 AM.  Afterwards, they decreased and fluctuating within the normal range and occasionally above.  She also has values lower than 70s, down to the 50s.  At this visit, I recommended to try to split her basal insulin again into a higher amount in the morning and the lower amount at bedtime.  Since she is also stopping the sweets that she is eating with tea in the morning, I advised her that in that case she may need a lower dose of rapid acting insulin before her teeth.  Otherwise, I advised her to continue the same regimen but to start backing off insulin doses by 1 unit at the time if the sugars remain low. -I advised her  to check with her insurance whether a Dexcom CGM would have better coverage.  We definitely need to keep her on a CGM, she  cannot return to checking fingersticks-this would not be in her best interest especially with her history of hypoglycemia! - I suggested to:  Patient Instructions  Please change: - Lantus/Basaglar 6 units at 10 am and 3 units at bedtime - Novolog/Humalog:  4-6 units 15-30 before tea in am  6-7 units before lunch 4-5 units before dinner At night, do not use more than 1-2 units NovoLog for correction.  When you correct a low, do not use more then 15-30 g carbs.  Please return in 4 months.  - we checked her HbA1c: 8.1% (higher) - advised to check sugars at different times of the day - 4x a day, rotating check times - advised for yearly eye exams >> she is UTD - foot exam performed today - at this visit, she mentions headaches -not always related to hypoglycemia.  I recommended to see neurology. - I also strongly advised her not to be late for her appointments, especially since we have to also download her CGM when she comes - return to clinic in 4 months   2. HL -Reviewed latest lipid panel: LDL elevated, the rest of the fractions at goal: Lab Results  Component Value Date   CHOL 174 11/02/2020   HDL 54.00 11/02/2020   LDLCALC 109 (H) 11/02/2020   TRIG 58.0 11/02/2020   CHOLHDL 3 11/02/2020  -She is not on a statin.  We discussed today about improving diet by cutting down dairy and reducing sweets.  Improving diabetes control should also help  Philemon Kingdom, MD PhD [facility] Endocrinology

## 2021-04-05 ENCOUNTER — Encounter: Payer: Self-pay | Admitting: Neurology

## 2021-05-08 ENCOUNTER — Other Ambulatory Visit: Payer: Self-pay | Admitting: Internal Medicine

## 2021-05-08 DIAGNOSIS — E1065 Type 1 diabetes mellitus with hyperglycemia: Secondary | ICD-10-CM

## 2021-05-10 ENCOUNTER — Other Ambulatory Visit: Payer: Self-pay | Admitting: Internal Medicine

## 2021-05-10 DIAGNOSIS — E1065 Type 1 diabetes mellitus with hyperglycemia: Secondary | ICD-10-CM

## 2021-05-23 MED ORDER — BASAGLAR KWIKPEN 100 UNIT/ML ~~LOC~~ SOPN: 10.0000 [IU] | PEN_INJECTOR | Freq: Every day | SUBCUTANEOUS | 0 refills | Status: DC

## 2021-07-22 ENCOUNTER — Encounter: Payer: Self-pay | Admitting: Internal Medicine

## 2021-07-24 ENCOUNTER — Encounter: Payer: Self-pay | Admitting: Internal Medicine

## 2021-07-24 ENCOUNTER — Ambulatory Visit (INDEPENDENT_AMBULATORY_CARE_PROVIDER_SITE_OTHER): Payer: Commercial Managed Care - HMO | Admitting: Internal Medicine

## 2021-07-24 VITALS — BP 105/68 | HR 74 | Ht 65.0 in | Wt 153.4 lb

## 2021-07-24 DIAGNOSIS — E1065 Type 1 diabetes mellitus with hyperglycemia: Secondary | ICD-10-CM

## 2021-07-24 DIAGNOSIS — E7800 Pure hypercholesterolemia, unspecified: Secondary | ICD-10-CM | POA: Diagnosis not present

## 2021-07-24 LAB — POCT GLYCOSYLATED HEMOGLOBIN (HGB A1C): Hemoglobin A1C: 7.8 % — AB (ref 4.0–5.6)

## 2021-07-24 MED ORDER — INSULIN LISPRO (1 UNIT DIAL) 100 UNIT/ML (KWIKPEN): PEN_INJECTOR | SUBCUTANEOUS | 3 refills | Status: DC

## 2021-07-24 MED ORDER — BD PEN NEEDLE NANO 2ND GEN 32G X 4 MM MISC: 3 refills | Status: DC

## 2021-07-24 MED ORDER — BASAGLAR KWIKPEN 100 UNIT/ML ~~LOC~~ SOPN: 10.0000 [IU] | PEN_INJECTOR | Freq: Every day | SUBCUTANEOUS | 3 refills | Status: DC

## 2021-07-24 NOTE — Progress Notes (Signed)
Patient ID: Theresa Koch, female   DOB: 23-Oct-1980, 41 y.o.   MRN: [id number]  HPI: Theresa Koch is a 41 y.o.-year-old female, initially referred by Dr. Synetta Shadow, returning for f/u for DM1, dx in 2011, but at DM1 in 02/2015, insulin-dependent since 2013, uncontrolled, without long term complications. Last visit 4 months ago.  BCBS now.    Interim history: No increased urination, blurry vision, nausea, chest pain.   However, she restarted to have headaches, not always related to hypoglycemia.  She sometimes has to stay the entire day in bed. Her mom recently came from [place] to stay with her (due to the war there).  Reviewed HbA1c levels: Lab Results  Component Value Date   HGBA1C 8.2 (A) 03/15/2021   HGBA1C 7.7 (A) 11/02/2020   HGBA1C 7.9 (A) 05/31/2020   She is on: (Changes made at last visit are shown in bold) - Basaglar 6 units at 10 am and 3 >> 4 units at bedtime - Humalog:  4-6 units 15-30 before tea in am  6-7 units before lunch 4-5 units before dinner At night, do not use more than 1-2 units NovoLog for correction.  We stopped Metformin XR 500 mg 3x a day with meals. We stopped Glipizide XL 5 mg in am.  Pt.checks her sugar is more than 4 times a day with her libre CGM:   Previously:   Previously:   Lowest sugar was 40 >> ... 50s >> 53 >> 52; she has hypoglycemia awareness in the 60s. Highest sugar was 300s>> 300s >> 300.  Glucometer: One Touch Ultra  Pt's meals are: - Breakfast: tea with milk + cookies >> now only tea - Lunch: salad, soup, cheese  - Dinner: egg, cheese, meat, sometimes cereals - after dinner: milk >> stopped s  No CKD; latest BUN/creatinine: Lab Results  Component Value Date   BUN 20 11/02/2020   Lab Results  Component Value Date   CREATININE 0.66 11/02/2020   No MAU: Lab Results  Component Value Date   MICRALBCREAT 1.6 11/02/2020   MICRALBCREAT 1.1 11/25/2019   MICRALBCREAT 1.1 01/11/2019   MICRALBCREAT  0.9 04/10/2017   MICRALBCREAT 0.7 05/23/2015  She is not on ACE inhibitor/ARB.  + Mild HL: Lab Results  Component Value Date   CHOL 174 11/02/2020   HDL 54.00 11/02/2020   LDLCALC 109 (H) 11/02/2020   TRIG 58.0 11/02/2020   CHOLHDL 3 11/02/2020  She is not on a statin.  - last eye exam was in 01/2021: No DR reportedly.   - no numbness and tingling in her feet.  Foot exam performed 03/2021.  Latest TSH was reviewed and this was normal: Lab Results  Component Value Date   TSH 2.03 11/02/2020   ROS: + see HPI  I reviewed pt's medications, allergies, PMH, social hx, family hx, and changes were documented in the history of present illness. Otherwise, unchanged from my initial visit note.  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 DX CODE: O24.912 200 each 3   BD PEN NEEDLE NANO 2ND GEN 32G X 4 MM MISC USE AS DIRECTED FIVE TIMES DAILY 400 each 1   Blood Glucose Monitoring Suppl (ACCU-CHEK AVIVA PLUS) w/Device KIT Use to test blood sugar 6 times daily as instructed. Dx code: O24.912 1 kit 0   Continuous Blood Gluc Receiver (FREESTYLE LIBRE 2 READER) DEVI  1 EACH BY DOES NOT APPLY ROUTE ONCE FOR 1 DOSE. 1 each 0   Continuous Blood Gluc Sensor (FREESTYLE LIBRE 2 SENSOR) MISC CHANGE EVERY 14 DAYS 6 each 3   glucose blood (ACCU-CHEK AVIVA PLUS) test strip USE TO TEST BLOOD SUGAR 4 TIMES DAILY AS INSTRUCTED. DX CODE: O24.912 100 strip 11   insulin aspart (NOVOLOG FLEXPEN) 100 UNIT/ML FlexPen 5-6 Units 3 (three) times daily with meals.     Insulin Glargine (BASAGLAR KWIKPEN) 100 UNIT/ML Inject 10-12 Units into the skin daily. 15 mL 0   insulin lispro (HUMALOG KWIKPEN) 100 UNIT/ML KwikPen INJECT UP TO 20 UNITS INTO THE SKIN DAILY AS ADVISED. 15 mL 1   No current facility-administered medications on file prior to visit.   Allergies  Allergen Reactions   Penicillins Rash    Has patient had a PCN reaction causing immediate rash, facial/tongue/throat swelling, SOB or lightheadedness with hypotension: Yes Has patient had a PCN reaction causing severe rash involving mucus membranes or skin necrosis: No Has patient had a PCN reaction that required hospitalization No Has patient had a PCN reaction occurring within the last 10 years: No If all of the above answers are "NO", then may proceed with Cephalosporin use. Childhood allergy     Family History  Problem Relation Age of Onset   Diabetes Father    Kidney disease Father    Diabetes Sister    Diabetes Brother    Breast cancer Neg Hx    PE: BP 105/68 (BP Location: Left Arm, Patient Position: Sitting, Cuff Size: Normal)   Pulse 74   Ht 5' 5"  (1.651 m)   Wt 153 lb 6.4 oz (69.6 kg)   SpO2 99%   BMI 25.53 kg/m  Wt Readings from Last 3 Encounters:  07/24/21 153 lb 6.4 oz (69.6 kg)  03/15/21 154 lb 9.6 oz (70.1 kg)  11/02/20 156 lb 9.6 oz (71 kg)   Constitutional: normal weight, in NAD Eyes: PERRLA, EOMI, no exophthalmos ENT: moist mucous membranes, no thyromegaly, no cervical lymphadenopathy Cardiovascular: RRR, No MRG Respiratory: CTA B Musculoskeletal: no deformities Skin: moist, warm, no  rashes Neurological: no tremor with outstretched hands,  ASSESSMENT: 1. DM1, uncontrolled, without long term complications, but with hypo-/hyperglycemia  We confirmed DM1: Component     Latest Ref Rng 02/09/2015  C-Peptide     0.80 - 3.90 ng/mL 0.24 (L)  Glucose, Fasting     65 - 99 mg/dL 64 (L)  Glutamic Acid Decarb Ab     <5 IU/mL 42 (H)  Pancreatic Islet Cell Antibody     < 5 JDF Units <5   2. HL  PLAN:  1. Patient with history  of uncontrolled type 1 diabetes, on basal/bolus insulin regimen with still poor control.  She is very insulin sensitive so we are using low doses of insulin for her. -At last visit, sugars were increasing overnight, with a peak around 7-8 AM.  Afterwards, they were decreasing and fluctuating with the values occasionally down to the 50s.  Since she was dropping her sugars after the TEE, I advised her to back off the dose before this.  We also used a higher dose of Lantus in the morning and the lower dose at that time.  HbA1c at last visit was high, at 8.2%. -At last visit, I advised her to check with her insurance whether a Dexcom CGM would have better coverage.  We definitely need to keep her on a CGM, she cannot return to checking fingersticks-this would not be in her best interest especially with her history of hypoglycemia! CGM interpretation: -At today's visit, we reviewed her CGM downloads: It appears that 62% of values are in target range (goal >70%), while 30% are higher than 180 (goal <25%), and 8% are lower than 70 (goal <4%).  The calculated average blood sugar is 153.  The projected HbA1c for the next 3 months ([facility]MI) is 7.0%. -Reviewing the CGM trends, sugars appear to be increasing overnight and then decreasing abruptly after her tea in the morning.  She also has a decrease in blood sugars after lunch and dinner.  Therefore, at today's visit I advised her to take the higher dose of Basaglar at bedtime and a lower dose in the morning and also decrease all  of her Humalog doses -She does have occasional drastic drops in blood sugars after correcting a hypoglycemic event at night.  We reviewed the latest incident when her sugars were in the high 200s and she bolused 3 units and ate a small amount.  I advised her that she does not have to eat when the sugars are so high but she can just correct the blood sugar with 1 to 2 units of Humalog - I suggested to:  Patient Instructions  Please use the following regimen: - Basaglar 3 units at 10 am and 7 units at bedtime - Humalog:  3-4 units 15-30 before tea in am  3-4 units before lunch 3-4 units before dinner At night, do not use more than 1-2 units NovoLog for correction.  When you correct a low, do not use more then 15-30 g carbs.  Please return in 4 months.  - we checked her HbA1c: 7.8% (improved) - advised to check sugars at different times of the day - 4x a day, rotating check times - advised for yearly eye exams >> she is UTD - return to clinic in 4 months   2. HL -Reviewed latest lipid panel from 10/2020: LDL above target, the rest of the fractions at goal Lab Results  Component Value Date   CHOL 174 11/02/2020   HDL 54.00 11/02/2020   LDLCALC 109 (H) 11/02/2020   TRIG 58.0 11/02/2020   CHOLHDL 3 11/02/2020  -She is not on a statin.  We did discuss in the past about improving diet and cutting down dairy and reducing sweets -We will recheck a lipid panel again at next visit  Philemon Kingdom, MD PhD [facility] Endocrinology

## 2021-07-24 NOTE — Patient Instructions (Addendum)
Please use the following regimen: - Basaglar 3 units at 10 am and 7 units at bedtime - Humalog:  3-4 units 15-30 before tea in am  3-4 units before lunch 3-4 units before dinner At night, do not use more than 1-2 units NovoLog for correction.  When you correct a low, do not use more then 15-30 g carbs.  Please return in 4 months.

## 2021-08-20 NOTE — Progress Notes (Deleted)
NEUROLOGY CONSULTATION NOTE  Theresa Koch MRN: [id number] DOB: 07-24-1980  Referring provider: Earnestine Leys, FNP Primary care provider: Earnestine Leys, FNP  Reason for consult:  headaches  Assessment/Plan:   ***   Subjective:  Theresa Koch is a 41 year old female with DM II who presents for headaches.  History supplemented by referring provider's note.  Onset:  41 years old Location:  *** Quality:  *** Intensity:  ***.  *** denies new headache, thunderclap headache or severe headache that wakes *** from sleep. Aura:  *** Prodrome:  *** Postdrome:  *** Associated symptoms:  ***.  *** denies associated unilateral numbness or weakness. Duration:  *** Frequency:  4 to 5 times a week Frequency of abortive medication: *** Triggers:  *** Relieving factors:  *** Activity:  ***  Past NSAIDS/analgesics:  *** Past abortive triptans:  none Past abortive ergotamine:  none Past muscle relaxants:  none Past anti-emetic:  none Past antihypertensive medications:  *** Past antidepressant medications:  *** Past anticonvulsant medications:  *** Past anti-CGRP:  none Other past therapies:  ***  Current NSAIDS/analgesics:  *** Current triptans:  none Current ergotamine:  none Current anti-emetic:  none Current muscle relaxants:  none Current Antihypertensive medications:  none Current Antidepressant medications:  none Current Anticonvulsant medications:  topiramate 47m at bedtime Current anti-CGRP:  none Current Vitamins/Herbal/Supplements:  none Current Antihistamines/Decongestants:  none Other therapy:  none Hormone/birth control:  none Other medications:  Lantus, Novolog   Caffeine:  *** Alcohol:  *** Smoker:  *** Diet:  *** Exercise:  *** Depression:  ***; Anxiety:  *** Other pain:  *** Sleep hygiene:  *** Family history of headache:  ***      PAST MEDICAL HISTORY: Past Medical History:  Diagnosis Date   [place]     Previous cesarean section 10/30/2014   TOLAC successful 2016     PAST SURGICAL HISTORY: Past Surgical History:  Procedure Laterality Date   CESAREAN SECTION     CESAREAN SECTION N/A 05/09/2016   Procedure: CESAREAN SECTION;  Surgeon: HBobbye Charleston MD;  Location[place] [facility]  Service: Obstetrics;  Laterality: N/A;    MEDICATIONS: Current Outpatient Medications on File Prior to Visit  Medication Sig Dispense Refill   ACCU-CHEK SOFTCLIX LANCETS lancets USE TO TEST BLOOD SUGAR 6 TIMES DAILY AS INSTRUCTED. DX CODE: O24.912 200 each 3   Blood Glucose Monitoring Suppl (ACCU-CHEK AVIVA PLUS) w/Device KIT Use to test blood sugar 6 times daily as instructed. Dx code: O24.912 1 kit 0   Continuous Blood Gluc Receiver (FREESTYLE LIBRE 2 READER) DEVI 1 EACH BY DOES NOT APPLY ROUTE ONCE FOR 1 DOSE. 1 each 0   Continuous Blood Gluc Sensor (FREESTYLE LIBRE 2 SENSOR) MISC CHANGE EVERY 14 DAYS 6 each 3   glucose blood (ACCU-CHEK AVIVA PLUS) test strip USE TO TEST BLOOD SUGAR 4 TIMES DAILY AS INSTRUCTED. DX CODE: O24.912 100 strip 11   Insulin Glargine (BASAGLAR KWIKPEN) 100 UNIT/ML Inject 10-12 Units into the skin daily. 30 mL 3   insulin lispro (HUMALOG KWIKPEN) 100 UNIT/ML KwikPen INJECT UP TO 20 UNITS INTO THE SKIN DAILY AS ADVISED. 30 mL 3   Insulin Pen Needle (BD PEN NEEDLE NANO 2ND GEN) 32G X 4 MM MISC USE AS DIRECTED FIVE TIMES DAILY 400 each 3   No current facility-administered medications on file prior to visit.    ALLERGIES: Allergies  Allergen Reactions   Penicillins Rash    Has patient had  a PCN reaction causing immediate rash, facial/tongue/throat swelling, SOB or lightheadedness with hypotension: Yes Has patient had a PCN reaction causing severe rash involving mucus membranes or skin necrosis: No Has patient had a PCN reaction that required hospitalization No Has patient had a PCN reaction occurring within the last 10 years: No If all of the above answers are "NO", then  may proceed with Cephalosporin use. Childhood allergy     FAMILY HISTORY: Family History  Problem Relation Age of Onset   Diabetes Father    Kidney disease Father    Diabetes Sister    Diabetes Brother    Breast cancer Neg Hx     Objective:  *** General: No acute distress.  Patient appears well-groomed.   Head:  Normocephalic/atraumatic Eyes:  fundi examined but not visualized Neck: supple, no paraspinal tenderness, full range of motion Back: No paraspinal tenderness Heart: regular rate and rhythm Lungs: Clear to auscultation bilaterally. Vascular: No carotid bruits. Neurological Exam: Mental status: alert and oriented to person, place, and time, speech fluent and not dysarthric, language intact. Cranial nerves: CN I: not tested CN II: pupils equal, round and reactive to light, visual fields intact CN III, IV, VI:  full range of motion, no nystagmus, no ptosis CN V: facial sensation intact. CN VII: upper and lower face symmetric CN VIII: hearing intact CN IX, X: gag intact, uvula midline CN XI: sternocleidomastoid and trapezius muscles intact CN XII: tongue midline Bulk & Tone: normal, no fasciculations. Motor:  muscle strength 5/5 throughout Sensation:  Pinprick, temperature and vibratory sensation intact. Deep Tendon Reflexes:  2+ throughout,  toes downgoing.   Finger to nose testing:  Without dysmetria.   Heel to shin:  Without dysmetria.   Gait:  Normal station and stride.  Romberg negative.    Thank you for allowing me to take part in the care of this patient.  Metta Clines, DO  CC: ***

## 2021-08-21 ENCOUNTER — Ambulatory Visit: Payer: Self-pay | Admitting: Neurology

## 2021-11-19 ENCOUNTER — Ambulatory Visit: Payer: Commercial Managed Care - HMO | Admitting: Internal Medicine

## 2021-11-19 ENCOUNTER — Encounter: Payer: Self-pay | Admitting: Internal Medicine

## 2021-11-19 VITALS — BP 100/72 | HR 66 | Ht 65.0 in | Wt 153.4 lb

## 2021-11-19 DIAGNOSIS — E1065 Type 1 diabetes mellitus with hyperglycemia: Secondary | ICD-10-CM

## 2021-11-19 DIAGNOSIS — E7800 Pure hypercholesterolemia, unspecified: Secondary | ICD-10-CM

## 2021-11-19 LAB — COMPREHENSIVE METABOLIC PANEL
ALT: 10 U/L (ref 0–35)
AST: 17 U/L (ref 0–37)
Albumin: 3.9 g/dL (ref 3.5–5.2)
Alkaline Phosphatase: 41 U/L (ref 39–117)
BUN: 18 mg/dL (ref 6–23)
CO2: 29 mEq/L (ref 19–32)
Calcium: 8.6 mg/dL (ref 8.4–10.5)
Chloride: 102 mEq/L (ref 96–112)
Creatinine, Ser: 0.61 mg/dL (ref 0.40–1.20)
GFR: 110.79 mL/min (ref >60.00)
Glucose, Bld: 225 mg/dL — ABNORMAL HIGH (ref 70–99)
Potassium: 4.1 mEq/L (ref 3.5–5.1)
Sodium: 133 mEq/L — ABNORMAL LOW (ref 135–145)
Total Bilirubin: 0.3 mg/dL (ref 0.2–1.2)
Total Protein: 6.9 g/dL (ref 6.0–8.3)

## 2021-11-19 LAB — TSH: TSH: 1.73 u[IU]/mL (ref 0.35–5.50)

## 2021-11-19 LAB — LIPID PANEL
Cholesterol: 146 mg/dL (ref 0–200)
HDL: 50.1 mg/dL (ref >39.00)
LDL Cholesterol: 84 mg/dL (ref 0–99)
NonHDL: 95.46
Total CHOL/HDL Ratio: 3
Triglycerides: 58 mg/dL (ref 0.0–149.0)
VLDL: 11.6 mg/dL (ref 0.0–40.0)

## 2021-11-19 LAB — MICROALBUMIN / CREATININE URINE RATIO
Creatinine,U: 70.6 mg/dL
Microalb Creat Ratio: 1 mg/g (ref 0.0–30.0)
Microalb, Ur: <0.7 mg/dL (ref 0.0–1.9)

## 2021-11-19 LAB — POCT GLYCOSYLATED HEMOGLOBIN (HGB A1C): Hemoglobin A1C: 7.1 % — AB (ref 4.0–5.6)

## 2021-11-19 MED ORDER — GLUCAGON 3 MG/DOSE NA POWD: 3.0000 mg | Freq: Once | NASAL | 11 refills | Status: DC | PRN

## 2021-11-19 NOTE — Progress Notes (Signed)
Patient ID: Theresa Koch, female   DOB: [birth year], 41 y.o.   MRN: [id number]  HPI: Theresa Koch is a 41 y.o.-year-old female, initially referred by Dr. Synetta Shadow, returning for f/u for DM1, dx in 2011, but at DM1 in 02/2015, insulin-dependent since 2013, uncontrolled, without long term complications. Last visit 4 months ago.  BCBS now.    Interim history: No increased urination, blurry vision, nausea.  She continues to have headaches, now only related to hypoglycemia.   Reviewed HbA1c levels: Lab Results  Component Value Date   HGBA1C 7.8 (A) 07/24/2021   HGBA1C 8.2 (A) 03/15/2021   HGBA1C 7.7 (A) 11/02/2020   HGBA1C 7.9 (A) 05/31/2020   HGBA1C 8.1 (A) 11/25/2019   HGBA1C 8.0 (A) 01/11/2019   HGBA1C 8.8 (A) 03/22/2018   HGBA1C 7.9 (A) 09/04/2017   HGBA1C 7.8 04/10/2017   HGBA1C 6.0 03/10/2016   HGBA1C 5.6 12/14/2015   HGBA1C 7.2 09/14/2015   HGBA1C 7.1 05/23/2015   HGBA1C 8.1 (H) 02/09/2015   HGBA1C 6.5 08/29/2014   HGBA1C 6.2 05/12/2014   HGBA1C 7.6 (H) 02/14/2014   HGBA1C 8.8 12/19/2013    She is on:  - Basaglar 3 units at 10 am and 7 units at bedtime >> 10 units at waking up - Humalog:  3-4 units 15-30 before tea in am  3-4 units before lunch 3-4 units before dinner  We stopped Metformin XR 500 mg 3x a day with meals. We stopped Glipizide XL 5 mg in am.  Pt.checks her sugar is more than 4 times a day with her libre CGM:  Previously:   Previously:   Lowest sugar was 40 >> ... 50s >> 53 >> 52 >> 50s; she has hypoglycemia awareness in the 60s. Highest sugar was 300s>> 300s >> 300 >> 300s.  Glucometer: One Touch Ultra  Pt's meals are: - Breakfast: tea with milk + cookies >> now only tea - Lunch: salad, soup, cheese  - Dinner: egg, cheese, meat, sometimes cereals - after dinner: milk >> stopped s  No CKD; latest BUN/creatinine: Lab Results  Component Value Date   BUN 20 11/02/2020   Lab Results  Component Value Date    CREATININE 0.66 11/02/2020   No MAU: Lab Results  Component Value Date   MICRALBCREAT 1.6 11/02/2020   MICRALBCREAT 1.1 11/25/2019   MICRALBCREAT 1.1 01/11/2019   MICRALBCREAT 0.9 04/10/2017   MICRALBCREAT 0.7 05/23/2015  She is not on ACE inhibitor/ARB.  + Mild HL: Lab Results  Component Value Date   CHOL 174 11/02/2020   HDL 54.00 11/02/2020   LDLCALC 109 (H) 11/02/2020   TRIG 58.0 11/02/2020   CHOLHDL 3 11/02/2020  She is not on a statin.  - last eye exam was in 01/2021: No DR reportedly.   - no numbness and tingling in her feet.  Foot exam performed 03/2021.  Latest TSH was reviewed and this was normal: Lab Results  Component Value Date   TSH 2.03 11/02/2020   ROS: + see HPI  I reviewed pt's medications, allergies, PMH, social hx, family hx, and changes were documented in the history of present illness. Otherwise, unchanged from my initial visit note.  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 DX CODE: O24.912 200 each 3   Blood Glucose Monitoring Suppl (ACCU-CHEK AVIVA PLUS) w/Device KIT Use to test blood sugar 6 times daily as  instructed. Dx code: O24.912 1 kit 0   Continuous Blood Gluc Receiver (FREESTYLE LIBRE 2 READER) DEVI 1 EACH BY DOES NOT APPLY ROUTE ONCE FOR 1 DOSE. 1 each 0   Continuous Blood Gluc Sensor (FREESTYLE LIBRE 2 SENSOR) MISC CHANGE EVERY 14 DAYS 6 each 3   glucose blood (ACCU-CHEK AVIVA PLUS) test strip USE TO TEST BLOOD SUGAR 4 TIMES DAILY AS INSTRUCTED. DX CODE: O24.912 100 strip 11   Insulin Glargine (BASAGLAR KWIKPEN) 100 UNIT/ML Inject 10-12 Units into the skin daily. 30 mL 3   insulin lispro (HUMALOG KWIKPEN) 100 UNIT/ML KwikPen INJECT UP TO 20 UNITS INTO THE SKIN DAILY AS ADVISED. 30 mL 3   Insulin Pen Needle (BD PEN NEEDLE NANO 2ND GEN) 32G X 4 MM MISC USE AS DIRECTED FIVE TIMES DAILY 400 each 3   No current facility-administered medications on file prior to visit.   Allergies  Allergen Reactions   Penicillins Rash    Has patient had a PCN reaction causing immediate rash, facial/tongue/throat swelling, SOB or lightheadedness with hypotension: Yes Has patient had a PCN reaction causing severe rash involving mucus membranes or skin necrosis: No Has patient had a PCN reaction that required hospitalization No Has patient had a PCN reaction occurring within the last 10 years: No If all of the above answers are "NO", then may proceed with Cephalosporin use. Childhood allergy     Family History  Problem Relation Age of Onset   Diabetes Father    Kidney disease Father    Diabetes Sister    Diabetes Brother    Breast cancer Neg Hx    PE: BP 100/72 (BP Location: Right Arm, Patient Position: Sitting, Cuff Size: Normal)   Pulse 66   Ht _0  (1.651 m)   Wt 153 lb 6.4 oz (69.6 kg)   SpO2 99%   BMI 25.53 kg/m  Wt Readings from Last 3 Encounters:  11/19/21 153 lb 6.4 oz (69.6 kg)  07/24/21 153 lb 6.4 oz (69.6 kg)  03/15/21 154 lb 9.6 oz (70.1 kg)   Constitutional: normal weight, in NAD Eyes: EOMI, no exophthalmos ENT: no thyromegaly, no cervical lymphadenopathy Cardiovascular: RRR,  No MRG Respiratory: CTA B Musculoskeletal: no deformities Skin: moist, warm, no rashes Neurological: no tremor with outstretched hands  ASSESSMENT: 1. DM1, uncontrolled, without long term complications, but with hypo-/hyperglycemia  We confirmed DM1: Component     Latest Ref Rng 02/09/2015  C-Peptide     0.80 - 3.90 ng/mL 0.24 (L)  Glucose, Fasting     65 - 99 mg/dL 64 (L)  Glutamic Acid Decarb Ab     <5 IU/mL 42 (H)  Pancreatic Islet  Cell Antibody     < 5 JDF Units <5   2. HL  PLAN:  1. Patient with history of uncontrolled type 1 diabetes, on basal/bolus insulin regimen, with still poor control.  She is very insulin sensitive so we are using low doses of insulin for her.  At last visit, reviewing her CGM trends, it appears that her sugars were increasing overnight and then decreasing abruptly after her tea in the morning.  She also had a decrease in blood sugars after lunch and dinner.  Therefore, we discussed about using a higher dose of Basaglar at bedtime and the lower dose in the morning and also to decrease all of her Humalog doses.  She had occasional drastic drops in blood sugars after correcting the hyperglycemic event at night so we discussed about correcting these with only 1 to 3 units of Humalog, rather than 3 units as she was doing before. CGM interpretation: -At today's visit, we reviewed her CGM downloads: It appears that 55% of values are in target range (goal >70%), while 32% are higher than 180 (goal <25%), and 13% are lower than 70 (goal <4%).  The calculated average blood sugar is 150.  The projected HbA1c for the next 3 months (GMI) is 6.9%. -Reviewing the CGM trends, sugars appear to be higher overnight, but she also has high variability overnight, they improved in the morning, increasing again after lunch.  In the evening, sugars have varied mostly within the target range, but she occasionally drops her blood sugars after dinner and she overcorrects them.  At today's  visit, we discussed about trying not to overcorrect the low blood sugars and following the levels with the glucometer not the CGM when she corrects a low or high blood sugars, due to the lag time between capillary and interstitial fluid glucose changes. -Also, at today's visit, she tells me that she is actually taking Basaglar only once, at waking up, 10 units.  This may be the reason why she continues to have low blood sugars throughout the day.  I again advised her to split the dose into a higher dose in the morning and the lower dose at that time.  At lunchtime, especially after starting Lantus, her sugar can be high despite taking Humalog 15 minutes before this meal.  We discussed that for richer lunches, she may need 5 units of insulin.  At dinnertime, she tells me that she is occasionally dropping her blood sugars if she takes 4 units of Humalog but then does not eat as much as she thought.  I advised her that she may need to glucose tablets if she already took 4 units of insulin but did not eat all that she expected. - I suggested to:  Patient Instructions  Please use the following regimen: - Basaglar 7 units in am and 3 units at bedtime - Humalog:  3-4 units 15-30 before tea in am  3-4 (5) units before lunch 3-4 units before dinner (may need  2 glucose tablets if you eat less than planned and took the 4 unit dose) At night, do not use more than 1-2 units NovoLog for correction. When you correct a low, do not use more then 15-30 g carbs.  Use the glucometer to check blood sugars when you correct a low or a high.  Please stop at the lab.  Please return in 4 months.  - we checked her HbA1c: 7.1% (lowest in 5 years) - advised to check sugars at different times  of the day - 4x a day, rotating check times - advised for yearly eye exams >> she is UTD - will check annual labs today - return to clinic in 4 months   2. HL -Reviewed latest lipid panel from 10/2020: LDL above target, the rest  the fractions at goal: Lab Results  Component Value Date   CHOL 174 11/02/2020   HDL 54.00 11/02/2020   LDLCALC 109 (H) 11/02/2020   TRIG 58.0 11/02/2020   CHOLHDL 3 11/02/2020  -She is not on a statin.  We did discuss in the past about improving diet and cutting down dairy and reducing sweets -We will check a lipid panel now Component     Latest Ref Rng 11/19/2021  Cholesterol     0 - 200 mg/dL 146   Triglycerides     0.0 - 149.0 mg/dL 58.0   HDL Cholesterol     >39.00 mg/dL 50.10   VLDL     0.0 - 40.0 mg/dL 11.6   LDL (calc)     0 - 99 mg/dL 84   Total CHOL/HDL Ratio 3   NonHDL 95.46   Microalb, Ur     0.0 - 1.9 mg/dL <0.7   Creatinine,U     mg/dL 70.6   MICROALB/CREAT RATIO     0.0 - 30.0 mg/g 1.0   TSH     0.35 - 5.50 uIU/mL 1.73   Glucose     70 - 99 mg/dL 225 (H)   BUN     6 - 23 mg/dL 18   Creatinine     0.40 - 1.20 mg/dL 0.61   Sodium     135 - 145 mEq/L 133 (L)   Potassium     3.5 - 5.1 mEq/L 4.1   Chloride     96 - 112 mEq/L 102   CO2     19 - 32 mEq/L 29   Calcium     8.4 - 10.5 mg/dL 8.6   Total Protein     6.0 - 8.3 g/dL 6.9   Total Bilirubin     0.2 - 1.2 mg/dL 0.3   AST     0 - 37 U/L 17   ALT     0 - 35 U/L 10   Alkaline Phosphatase     39 - 117 U/L 41   Albumin     3.5 - 5.2 g/dL 3.9   GFR     >60.00 mL/min 110.79     Labs are normal, with the exception of the high glucose and very mild dilutional hyponatremia.  Philemon Kingdom, MD PhD [facility]

## 2021-11-19 NOTE — Patient Instructions (Addendum)
Please use the following regimen: - Basaglar 7 units in am and 3 units at bedtime - Humalog:  3-4 units 15-30 before tea in am  3-4 (5) units before lunch 3-4 units before dinner (may need  2 glucose tablets if you eat less than planned and took the 4 unit dose) At night, do not use more than 1-2 units NovoLog for correction. When you correct a low, do not use more then 15-30 g carbs.  Use the glucometer to check blood sugars when you correct a low or a high.  Please stop at the lab.  Please return in 4 months.

## 2022-01-06 NOTE — L&D Delivery Note (Signed)
Delivery Note Theresa Koch is a 42 y.o. G4P3003 at [redacted]w[redacted]d admitted for IOL in the setting of Type 1 diabetes.   GBS Status:  Negative/-- (08/01 1647) Maximum Maternal Temperature: 98.8  Labor course: Initial SVE: 3.5/50/-3. Augmentation with: AROM and Pitocin. She then progressed to complete.  ROM: 11h 53m with clear fluid  Birth: At 1025 a viable female was delivered via spontaneous vaginal delivery (Presentation: cephalic;ROA). Nuchal cord present: Yes.  Delivered and somersaulted through. Shoulders and body delivered in usual fashion. Infant placed directly on mom's abdomen for bonding/skin-to-skin, baby dried and stimulated. Cord clamped x 2 after 1 minute and cut by FOB. Cord blood collected. The placenta separated spontaneously and delivered via gentle cord traction. Pitocin infused rapidly IV per protocol. TXA given prophylactically. Fundus firm with massage.  Placenta inspected and appears to be intact with a 3 VC.  Placenta/Cord with the following complications: n/a. Cord pH: n/a Sponge and instrument count were correct x2.  Intrapartum complications:  T1DM, on Endotool Anesthesia:  epidural Episiotomy: none Lacerations:  1st degree Suture Repair: 3.0 monocryl EBL (mL): 300   Infant: APGAR (1 MIN): 9  APGAR (5 MINS): 9  Infant weight: pending  Mom to postpartum.  Baby to Couplet care / Skin to Skin. Placenta to L&D   Plans to Breastfeed Contraception:  undecided Circumcision: N/A  Note sent to [facility]: [facility]W for pp visit.  Brand Males CNM 08/23/2022 10:51 AM

## 2022-01-08 ENCOUNTER — Ambulatory Visit (INDEPENDENT_AMBULATORY_CARE_PROVIDER_SITE_OTHER): Payer: Commercial Managed Care - HMO

## 2022-01-08 DIAGNOSIS — Z3200 Encounter for pregnancy test, result unknown: Secondary | ICD-10-CM

## 2022-01-08 DIAGNOSIS — Z3201 Encounter for pregnancy test, result positive: Secondary | ICD-10-CM | POA: Diagnosis not present

## 2022-01-08 NOTE — Progress Notes (Signed)
Possible Pregnancy  Here today to leave urine specimen for pregnancy confirmation. UPT in office today is positive. Called pt with results, but did not reach pt; VM left and MyChart message sent.  Annabell Howells, RN 01/08/2022  5:12 PM

## 2022-01-09 LAB — POCT PREGNANCY, URINE: Preg Test, Ur: POSITIVE — AB

## 2022-01-31 ENCOUNTER — Ambulatory Visit: Payer: Self-pay | Admitting: Neurology

## 2022-02-04 ENCOUNTER — Telehealth (INDEPENDENT_AMBULATORY_CARE_PROVIDER_SITE_OTHER): Payer: Self-pay

## 2022-02-04 DIAGNOSIS — O0990 Supervision of high risk pregnancy, unspecified, unspecified trimester: Secondary | ICD-10-CM | POA: Insufficient documentation

## 2022-02-04 DIAGNOSIS — Z3A11 11 weeks gestation of pregnancy: Secondary | ICD-10-CM

## 2022-02-04 DIAGNOSIS — O0991 Supervision of high risk pregnancy, unspecified, first trimester: Secondary | ICD-10-CM

## 2022-02-04 DIAGNOSIS — E1065 Type 1 diabetes mellitus with hyperglycemia: Secondary | ICD-10-CM

## 2022-02-04 HISTORY — DX: Supervision of high risk pregnancy, unspecified, unspecified trimester: O09.90

## 2022-02-04 NOTE — Progress Notes (Signed)
New OB Intake  I connected with Theresa Koch  on 02/04/22 at 11:15 AM EST by MyChart Video Visit and verified that I am speaking with the correct person using two identifiers. Nurse is located at [facility] and pt is located at home.  I discussed the limitations, risks, security and privacy concerns of performing an evaluation and management service by telephone and the availability of in person appointments. I also discussed with the patient that there may be a patient responsible charge related to this service. The patient expressed understanding and agreed to proceed.  I explained I am completing New OB Intake today. We discussed EDD of 08/22/22 that is based on LMP of 11/15/21. Pt is G4/P3. I reviewed her allergies, medications, Medical/Surgical/OB history, and appropriate screenings. I informed her of [facility] services. [facility] information placed in AVS. Based on history, this is a high risk pregnancy.  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 Patient given information for [facility] Healthy Baby website for more information about Women's and [place]. Patient is not interested in water birth. Offered upcoming OB visit with CNM to discuss further.  MyChart/Babyscripts MyChart access verified. I explained pt will have some visits in office and some virtually. Babyscripts instructions given and order placed. Patient verifies receipt of registration text/e-mail. Account successfully created and app downloaded.  Blood Pressure Cuff/Weight Scale Has own BP Cuff, Explained after first prenatal appt pt will check weekly and document in 29. Patient does  have weight scale.  Anatomy US Explained first scheduled [place] will be around 19 weeks. Anatomy US scheduled for 03/31/22 at 0745a. Pt notified to arrive at 0730a.  Labs Discussed Johnsie Cancel genetic screening with patient. Would like both Panorama and Horizon drawn at new OB visit. Routine prenatal labs needed.  COVID Vaccine Patient has had COVID vaccine.   Is patient a CenteringPregnancy candidate?  Not a Candidate Declined due to  NA Not a candidate due to DM If accepted,    Is patient a Mom+Baby Combined Care candidate?  Not a candidate   If accepted, Mom+Baby staff notified  Social Determinants of Health Food Insecurity: Patient denies food insecurity. WIC Referral: Patient is interested in referral to [facility].  Transportation: Patient denies transportation needs. Childcare: Discussed no children allowed at ultrasound appointments. Offered childcare services; patient declines childcare services at this time.  First visit review I reviewed new OB appt with patient. I explained they will have a provider visit that includes . Explained pt will be seen by Marcille Buffy, CNM at first visit; encounter routed to appropriate provider. Explained that patient will be seen by pregnancy navigator following visit with provider.   Theresa Koch, [place] 02/04/2022  11:21 AM

## 2022-02-11 ENCOUNTER — Encounter: Payer: Self-pay | Admitting: Advanced Practice Midwife

## 2022-02-13 ENCOUNTER — Other Ambulatory Visit: Payer: Self-pay

## 2022-02-24 ENCOUNTER — Ambulatory Visit (INDEPENDENT_AMBULATORY_CARE_PROVIDER_SITE_OTHER): Payer: Medicaid Other | Admitting: Advanced Practice Midwife

## 2022-02-24 ENCOUNTER — Encounter: Payer: Self-pay | Admitting: Family Medicine

## 2022-02-24 ENCOUNTER — Other Ambulatory Visit: Payer: Self-pay

## 2022-02-24 ENCOUNTER — Other Ambulatory Visit (HOSPITAL_COMMUNITY)
Admission: RE | Admit: 2022-02-24 | Discharge: 2022-02-24 | Disposition: A | Discharge status: Home / Self Care | Payer: Medicaid Other | Source: Ambulatory Visit | Attending: Advanced Practice Midwife | Admitting: Advanced Practice Midwife

## 2022-02-24 VITALS — BP 100/57 | HR 73 | Wt 157.0 lb

## 2022-02-24 DIAGNOSIS — O09522 Supervision of elderly multigravida, second trimester: Secondary | ICD-10-CM

## 2022-02-24 DIAGNOSIS — N90810 Female genital mutilation status, unspecified: Secondary | ICD-10-CM

## 2022-02-24 DIAGNOSIS — Z3A14 14 weeks gestation of pregnancy: Secondary | ICD-10-CM

## 2022-02-24 DIAGNOSIS — O0992 Supervision of high risk pregnancy, unspecified, second trimester: Secondary | ICD-10-CM

## 2022-02-24 DIAGNOSIS — O24012 Pre-existing diabetes mellitus, type 1, in pregnancy, second trimester: Secondary | ICD-10-CM

## 2022-02-24 DIAGNOSIS — O0990 Supervision of high risk pregnancy, unspecified, unspecified trimester: Secondary | ICD-10-CM

## 2022-02-24 DIAGNOSIS — O24019 Pre-existing diabetes mellitus, type 1, in pregnancy, unspecified trimester: Secondary | ICD-10-CM | POA: Insufficient documentation

## 2022-02-24 DIAGNOSIS — Z98891 History of uterine scar from previous surgery: Secondary | ICD-10-CM

## 2022-02-24 DIAGNOSIS — E1065 Type 1 diabetes mellitus with hyperglycemia: Secondary | ICD-10-CM | POA: Diagnosis not present

## 2022-02-24 DIAGNOSIS — E109 Type 1 diabetes mellitus without complications: Secondary | ICD-10-CM | POA: Insufficient documentation

## 2022-02-24 MED ORDER — ACCU-CHEK GUIDE W/DEVICE KIT: 1.0000 | PACK | 2 refills | Status: AC | PRN

## 2022-02-24 MED ORDER — GLUCOSE BLOOD VI STRP: ORAL_STRIP | 12 refills | Status: DC

## 2022-02-24 MED ORDER — PRENATAL PLUS 27-1 MG PO TABS: 1.0000 | ORAL_TABLET | Freq: Every day | ORAL | 12 refills | Status: AC

## 2022-02-24 NOTE — Patient Instructions (Addendum)
Vitamin B 6 twice a day for nausea

## 2022-02-24 NOTE — Progress Notes (Signed)
INITIAL PRENATAL VISIT  Subjective:   Theresa Koch is 42 y.o. [phone] female being seen today for her first obstetrical visit. She is not received other prenatal care previously this pregnancy. This is a planned pregnancy. This is a desired pregnancy.  She is at [redacted]w[redacted]d gestation by irreg LMP Her obstetrical history is significant for advanced maternal age and Type I Diabetes, C/S x 2 . Considering TOLAC. Relationship with FOB: spouse, living together. Patient does intend to breast feed. Pregnancy history fully reviewed.  2014: C/S per provider recommendation in [place] 2/2 Diabetes 2016: TOLAC 2018: C/S for breech  Review of Systems:   ROS no complaints.  Objective:    Obstetric History OB History  Gravida Para Term Preterm AB Living  4 3 3     3   SAB IAB Ectopic Multiple Live Births        0 3    # Outcome Date GA Lbr Len/2nd Weight Sex Delivery Anes PTL Lv  4 Current           3 Term 05/09/16 [redacted]w[redacted]d  7 lb 4.4 oz (3.3 kg) F CS-LTranv Spinal  LIV  2 Term 08/09/14 [redacted]w[redacted]d / 01:32 7 lb 9.2 oz (3.436 kg) F Vag-Spont EPI  LIV  1 Term 02/28/12 [redacted]w[redacted]d  5 lb 15.2 oz (2.7 kg) F CS-LTranv EPI N LIV    Obstetric Comments  Previous cs for diabetes    Past Medical History:  Diagnosis Date   Diabetes mellitus without complication (HCC)    Previous cesarean section 10/30/2014   TOLAC successful 2016     Past Surgical History:  Procedure Laterality Date   CESAREAN SECTION     CESAREAN SECTION N/A 05/09/2016   Procedure: CESAREAN SECTION;  Surgeon: Carrington Clamp, MD;  Location: [facility];  Service: Obstetrics;  Laterality: N/A;    Current Outpatient Medications on File Prior to Visit  Medication Sig Dispense Refill   Continuous Blood Gluc Receiver (FREESTYLE LIBRE 2 READER) DEVI 1 EACH BY DOES NOT APPLY ROUTE ONCE FOR 1 DOSE. 1 each 0   Continuous Blood Gluc Sensor (FREESTYLE LIBRE 2 SENSOR) MISC CHANGE EVERY 14 DAYS 6 each 3   glucose blood (ACCU-CHEK AVIVA PLUS)  test strip USE TO TEST BLOOD SUGAR 4 TIMES DAILY AS INSTRUCTED. DX CODE: O24.912 100 strip 11   Insulin Glargine (BASAGLAR KWIKPEN) 100 UNIT/ML Inject 10-12 Units into the skin daily. 30 mL 3   insulin lispro (HUMALOG KWIKPEN) 100 UNIT/ML KwikPen INJECT UP TO 20 UNITS INTO THE SKIN DAILY AS ADVISED. 30 mL 3   prenatal vitamin w/FE, FA (PRENATAL 1 + 1) 27-1 MG TABS tablet Take 1 tablet by mouth daily at 12 noon.     ACCU-CHEK SOFTCLIX LANCETS lancets USE TO TEST BLOOD SUGAR 6 TIMES DAILY AS INSTRUCTED. DX CODE: O24.912 200 each 3   Blood Glucose Monitoring Suppl (ACCU-CHEK AVIVA PLUS) w/Device KIT Use to test blood sugar 6 times daily as instructed. Dx code: O24.912 1 kit 0   Glucagon 3 MG/DOSE POWD Place 3 mg into the nose once as needed for up to 1 dose. 1 each 11   Insulin Pen Needle (BD PEN NEEDLE NANO 2ND GEN) 32G X 4 MM MISC USE AS DIRECTED FIVE TIMES DAILY 400 each 3   No current facility-administered medications on file prior to visit.    Allergies  Allergen Reactions   Penicillins Rash    Has patient had a PCN reaction causing immediate rash, facial/tongue/throat swelling,  SOB or lightheadedness with hypotension: Yes Has patient had a PCN reaction causing severe rash involving mucus membranes or skin necrosis: No Has patient had a PCN reaction that required hospitalization No Has patient had a PCN reaction occurring within the last 10 years: No If all of the above answers are "NO", then may proceed with Cephalosporin use. Childhood allergy     Social History:  reports that she has never smoked. She has never used smokeless tobacco. She reports that she does not drink alcohol and does not use drugs.  Family History  Problem Relation Age of Onset   Diabetes Father    Kidney disease Father    Diabetes Sister    Diabetes Brother    Breast cancer Neg Hx     The following portions of the patient's history were reviewed and updated as appropriate: allergies, current medications,  past family history, past medical history, past social history, past surgical history and problem list.  Physical Exam:  BP (!) 100/57   Pulse 73   Wt 157 lb (71.2 kg)   LMP 11/15/2021   BMI 26.13 kg/m  CONSTITUTIONAL: Well-developed, well-nourished female in no acute distress.  HENT:  Normocephalic, atraumatic. Oropharynx is clear and moist EYES: Conjunctivae normal. No scleral icterus.  SKIN: Skin is warm and dry. No rash noted. Not diaphoretic. No erythema. No pallor. MUSCULOSKELETAL: Normal range of motion. No tenderness.  No cyanosis, clubbing, or edema.   NEUROLOGIC: Alert and oriented to person, place, and time. Normal muscle tone coordination.  PSYCHIATRIC: Normal mood and affect. Normal behavior. Normal judgment and thought content. CARDIOVASCULAR: Normal heart rate noted. RESPIRATORY: Effort and rate normal. BREASTS: Declined ABDOMEN: Soft, no distention, tenderness, rebound or guarding. Fundal ht: 14 cm PELVIC: Normal appearing external genitalia; normal appearing vaginal mucosa and cervix.  No abnormal discharge noted.  Pap smear not obtained.  Uterus S=D, no other palpable masses, no uterine or adnexal tenderness. Fetal Status: Fetal Heart Rate (bpm): 167   Movement: Absent     Indications for ASA therapy (per uptodate) One of the following: Type 1 or 2 diabetes mellitus Yes   Assessment:   Pregnancy: G4P3003 1. Type 1 diabetes mellitus with hyperglycemia, with long-term current use of insulin (HCC) - Followed by [facility] - DM education - Discussed recommended CBGs - Eye exam nml  months ago per pt. - A1C - Used Libre but sometimes CBGs vary by up to 30 mg/dl from her mother's meter. Requests meter Rx. 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 Supervision of high risk pregnancy, antepartum  - Culture, OB Urine - CBC/D/Plt+RPR+Rh+ABO+RubIgG... - Hemoglobin A1c - GC/Chlamydia probe amp ([place])not at  [facility]  4. AMA (advanced maternal age) multigravida 35+, second trimester  - Culture, OB Urine - CBC/D/Plt+RPR+Rh+ABO+RubIgG... - Hemoglobin A1c - GC/Chlamydia probe amp ()not at [facility]  5. [redacted] weeks gestation of pregnancy     Plan:  Initial labs drawn. Prenatal vitamins. Rx ASA for reduction of risk for preeclampsia.  Problem list reviewed and updated. Genetic screening discussed: NIPS/First trimester screen/Quad/AFP ordered Panorama. Undecided about AFP. Role of ultrasound in pregnancy discussed; Anatomy US: ordered. Amniocentesis discussed:  declined . Follow up in 4 weeks. Traditional Discussed clinic routines, schedule of care and testing, genetic screening options, involvement of students and residents under the direct supervision of APPs and doctors and presence of female providers. Pt verbalized understanding.  [place], [place] 02/24/2022 9:55 AM

## 2022-02-25 DIAGNOSIS — Z98891 History of uterine scar from previous surgery: Secondary | ICD-10-CM | POA: Insufficient documentation

## 2022-02-25 LAB — CBC/D/PLT+RPR+RH+ABO+RUBIGG...
Antibody Screen: NEGATIVE
Basophils Absolute: 0 10*3/uL (ref 0.0–0.2)
Basos: 0 %
EOS (ABSOLUTE): 0.1 10*3/uL (ref 0.0–0.4)
Eos: 2 %
HCV Ab: NONREACTIVE
HIV Screen 4th Generation wRfx: NONREACTIVE
Hematocrit: 30.3 % — ABNORMAL LOW (ref 34.0–46.6)
Hemoglobin: 9.4 g/dL — ABNORMAL LOW (ref 11.1–15.9)
Hepatitis B Surface Ag: NEGATIVE
Immature Grans (Abs): 0 10*3/uL (ref 0.0–0.1)
Immature Granulocytes: 0 %
Lymphocytes Absolute: 1.1 10*3/uL (ref 0.7–3.1)
Lymphs: 21 %
MCH: 23.4 pg — ABNORMAL LOW (ref 26.6–33.0)
MCHC: 31 g/dL — ABNORMAL LOW (ref 31.5–35.7)
MCV: 76 fL — ABNORMAL LOW (ref 79–97)
Monocytes Absolute: 0.5 10*3/uL (ref 0.1–0.9)
Monocytes: 9 %
Neutrophils Absolute: 3.6 10*3/uL (ref 1.4–7.0)
Neutrophils: 68 %
Platelets: 178 10*3/uL (ref 150–450)
RBC: 4.01 x10E6/uL (ref 3.77–5.28)
RDW: 17.6 % — ABNORMAL HIGH (ref 11.7–15.4)
RPR Ser Ql: NONREACTIVE
Rh Factor: POSITIVE
Rubella Antibodies, IGG: 9.2 index (ref >0.99)
WBC: 5.3 10*3/uL (ref 3.4–10.8)

## 2022-02-25 LAB — HCV INTERPRETATION

## 2022-02-25 LAB — HEMOGLOBIN A1C
Est. average glucose Bld gHb Est-mCnc: 163 mg/dL
Hgb A1c MFr Bld: 7.3 % — ABNORMAL HIGH (ref 4.8–5.6)

## 2022-02-25 LAB — GC/CHLAMYDIA PROBE AMP (~~LOC~~) NOT AT ARMC
Chlamydia: NEGATIVE
Comment: NEGATIVE
Comment: NORMAL
Neisseria Gonorrhea: NEGATIVE

## 2022-02-25 MED ORDER — ASPIRIN 81 MG PO TBEC: 81.0000 mg | DELAYED_RELEASE_TABLET | Freq: Every day | ORAL | 2 refills | Status: DC

## 2022-02-26 LAB — URINE CULTURE, OB REFLEX: Organism ID, Bacteria: NO GROWTH

## 2022-02-26 LAB — CULTURE, OB URINE

## 2022-03-01 LAB — PANORAMA PRENATAL TEST FULL PANEL:PANORAMA TEST PLUS 5 ADDITIONAL MICRODELETIONS: FETAL FRACTION: 7.3

## 2022-03-03 LAB — HORIZON CUSTOM: REPORT SUMMARY: NEGATIVE

## 2022-03-04 ENCOUNTER — Encounter: Payer: Self-pay | Admitting: Advanced Practice Midwife

## 2022-03-05 ENCOUNTER — Telehealth: Payer: Self-pay | Admitting: Family Medicine

## 2022-03-05 NOTE — Telephone Encounter (Signed)
Patient called in saying she is not available any Tuesday or Thursday. She wanted to cancel her education appointment and see how else her scheduling needs can be met.

## 2022-03-06 ENCOUNTER — Other Ambulatory Visit: Payer: Medicaid Other

## 2022-03-06 NOTE — Telephone Encounter (Signed)
I called Theresa Koch and left a message I am returning her call and for her to please call us back and leave a message on nurse voicemail of what days she is avaliable to be rescheduled to and we will see if we can meet her needs. Staci Acosta ( can schedule at [place])

## 2022-03-07 ENCOUNTER — Telehealth: Payer: Self-pay

## 2022-03-07 NOTE — Telephone Encounter (Signed)
Pt advised she is out of one of her medications and will need assistance with the pharmacy. Attempted to contact pt and lvm for her to call back to clarify which medication.

## 2022-03-10 ENCOUNTER — Other Ambulatory Visit: Payer: Self-pay | Admitting: Internal Medicine

## 2022-03-10 DIAGNOSIS — E1065 Type 1 diabetes mellitus with hyperglycemia: Secondary | ICD-10-CM

## 2022-03-11 NOTE — Telephone Encounter (Signed)
Called patient, no answer- left message to call us back regarding an appt. Will send mychart message. [facility]'t have availability until 4/15

## 2022-03-21 ENCOUNTER — Telehealth: Payer: Self-pay

## 2022-03-21 ENCOUNTER — Other Ambulatory Visit (HOSPITAL_COMMUNITY): Payer: Self-pay

## 2022-03-21 ENCOUNTER — Telehealth: Payer: Self-pay | Admitting: Pharmacy Technician

## 2022-03-21 MED ORDER — FREESTYLE LIBRE 2 SENSOR MISC: 5 refills | Status: DC

## 2022-03-21 NOTE — Telephone Encounter (Signed)
Pharmacy Patient Advocate Encounter  Theresa Koch is unable to further process the request. They show Theresa Koch has alternative insurance. This is the only ins that comes up when we do an eligibility check. If Theresa Koch does not have another ins, Theresa Koch has to contact her plan administrator as well as Medicaid so that they will take it off of their record.  If Theresa Koch does have a different primary insurance, we need that pharmacy information in order to send them the request.

## 2022-03-21 NOTE — Telephone Encounter (Signed)
Patient Advocate Encounter   Received notification from [facility] that prior authorization is required for [employer] 2 Sensor   Submitted: 03-21-2022 Key [id number]  Status is pending

## 2022-03-21 NOTE — Addendum Note (Signed)
Addended by: Lauralyn Primes on: 03/21/2022 03:09 PM   Modules accepted: Orders

## 2022-03-21 NOTE — Telephone Encounter (Signed)
Send determination to [place] once received.

## 2022-03-21 NOTE — Telephone Encounter (Signed)
-----   Message from Lauralyn Primes, [place] sent at 03/14/2022 10:11 AM EST ----- Regarding: FW: PA Can we get this marked expedited. Pt is out of sensors and calling back for updates. Thank you ----- Message ----- From: Lauralyn Primes, RMA Sent: 03/10/2022   1:14 PM EST To: Rx Prior Auth Team Subject: PA                                             Can we get a PA request for [employer] sensors. Thank you.

## 2022-03-21 NOTE — Telephone Encounter (Signed)
PA already requested earlier today

## 2022-03-24 NOTE — Telephone Encounter (Signed)
Patient came in to office today and picked up 2 samples of Free Style Libre 2 Sensors (okayed by Dr. Arman Filter MA-TR).

## 2022-03-25 ENCOUNTER — Other Ambulatory Visit (HOSPITAL_COMMUNITY): Payer: Self-pay

## 2022-03-25 NOTE — Telephone Encounter (Signed)
Unfortunately the test claim still says to submit to primary processor. Unfortunately it usually takes some time for them to get these issues resolved. She will likely have to call multiple times.

## 2022-03-26 ENCOUNTER — Encounter: Payer: Self-pay | Admitting: Internal Medicine

## 2022-03-26 ENCOUNTER — Ambulatory Visit (INDEPENDENT_AMBULATORY_CARE_PROVIDER_SITE_OTHER): Payer: Medicaid Other | Admitting: Internal Medicine

## 2022-03-26 VITALS — BP 128/78 | HR 78 | Ht 65.0 in | Wt 160.0 lb

## 2022-03-26 DIAGNOSIS — E1065 Type 1 diabetes mellitus with hyperglycemia: Secondary | ICD-10-CM

## 2022-03-26 DIAGNOSIS — E7800 Pure hypercholesterolemia, unspecified: Secondary | ICD-10-CM

## 2022-03-26 NOTE — Patient Instructions (Addendum)
Please use the following regimen: - Basaglar 7 units in am and 4-5 units at bedtime - Humalog:  4-5 units 15-30 before tea in am  6-7 units before lunch 5 units before dinner  At night, do not use more than 1-2 units NovoLog for correction. When you correct a low, do not use more then 15-30 g carbs.  Please return in 1-1.5 months.

## 2022-03-26 NOTE — Progress Notes (Signed)
Patient ID: [name], female   DOB: [birth year], 42 y.o.   MRN: [id number]  HPI: Doreather Valladarez is a 42 y.o.-year-old female, initially referred by Dr. Synetta Shadow, returning for f/u for DM1, dx in 2011, but at DM1 in 02/2015, insulin-dependent since 2013, uncontrolled, without long term complications. Last visit 4 months ago.   Interim history: Patient is currently pregnant, at 18 weeks and 5 days. No increased urination, blurry vision, but he has nausea. She also has congestion and cough + chest pain with coughing.   Reviewed HbA1c levels: Lab Results  Component Value Date   HGBA1C 7.3 (H) 02/24/2022   HGBA1C 7.1 (A) 11/19/2021   HGBA1C 7.8 (A) 07/24/2021   HGBA1C 8.2 (A) 03/15/2021   HGBA1C 7.7 (A) 11/02/2020   HGBA1C 7.9 (A) 05/31/2020   HGBA1C 8.1 (A) 11/25/2019   HGBA1C 8.0 (A) 01/11/2019   HGBA1C 8.8 (A) 03/22/2018   HGBA1C 7.9 (A) 09/04/2017   HGBA1C 7.8 04/10/2017   HGBA1C 6.0 03/10/2016   HGBA1C 5.6 12/14/2015   HGBA1C 7.2 09/14/2015   HGBA1C 7.1 05/23/2015   HGBA1C 8.1 (H) 02/09/2015   HGBA1C 6.5 08/29/2014   HGBA1C 6.2 05/12/2014   HGBA1C 7.6 (H) 02/14/2014   HGBA1C 8.8 12/19/2013    She is on:  - Basaglar 10 units in am >> split: 7 units in am and 3 units at bedtime - Humalog:  3-4 >> 5-6 units 15-30 before tea in am  3-4 (5) >> 5 units before lunch 3-4 units before dinner (may need  2 glucose tablets if you eat less than planned and took the 4 unit dose) At night, do not use more than 1-2 units NovoLog for correction. When you correct a low, do not use more then 15-30 g carbs.  We stopped Metformin XR 500 mg 3x a day with meals. We stopped Glipizide XL 5 mg in am.  Pt.checks her sugar is more than 4 times a day with her libre CGM:  Blood for the last 3 days:  Previously:  Previously:   Lowest sugar was 40 >> ... 52 >> 50s >> 40s at night 3 weeks ago; she has hypoglycemia awareness in the 60s. Highest sugar was 300 >> 300s  >> 270s.  Glucometer: One Touch Ultra  Pt's meals are: - Breakfast: tea with milk + cookies >> now only tea - Lunch: salad, soup, cheese  - Dinner: egg, cheese, meat, sometimes cereals - after dinner: milk >> stopped s  No CKD; latest BUN/creatinine: Lab Results  Component Value Date   BUN 18 11/19/2021   Lab Results  Component Value Date   CREATININE 0.61 11/19/2021   No MAU: Lab Results  Component Value Date   MICRALBCREAT 1.0 11/19/2021   MICRALBCREAT 1.6 11/02/2020   MICRALBCREAT 1.1 11/25/2019   MICRALBCREAT 1.1 01/11/2019   MICRALBCREAT 0.9 04/10/2017   MICRALBCREAT 0.7 05/23/2015  She is not on ACE inhibitor/ARB.  + Mild HL: Lab Results  Component Value Date   CHOL 146 11/19/2021   HDL 50.10 11/19/2021   LDLCALC 84 11/19/2021   TRIG 58.0 11/19/2021   CHOLHDL 3 11/19/2021  She is not on a statin.  - last eye exam was in 01/2021: No DR reportedly.   - no numbness and tingling in her feet.  Foot exam performed 03/2021.  Latest TSH was reviewed and this was normal: Lab Results  Component Value Date   TSH 1.73 11/19/2021   ROS: + see HPI  I  reviewed pt's medications, allergies, PMH, social hx, family hx, and changes were documented in the history of present illness. Otherwise, unchanged from my initial visit note.  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 DX CODE: O24.912 200 each 3   aspirin EC 81 MG tablet Take 1 tablet (81 mg total) by mouth daily. Take after 12 weeks for prevention of preeclampssia later in pregnancy 300 tablet 2   Blood Glucose Monitoring Suppl (ACCU-CHEK GUIDE) w/Device KIT 1 Device by Does not apply route as needed. 1 kit 2   Continuous Blood Gluc Receiver (FREESTYLE LIBRE 2 READER) DEVI 1 EACH BY DOES NOT APPLY ROUTE ONCE FOR 1 DOSE. 1 each 0   Continuous Blood Gluc Sensor (FREESTYLE LIBRE 2 SENSOR) MISC CHANGE EVERY 14 DAYS 2 each 5   Glucagon 3 MG/DOSE POWD Place 3 mg into the nose once as needed for up to 1 dose. 1 each 11   glucose blood test strip Use as instructed 100 each 12   Insulin Glargine (BASAGLAR KWIKPEN) 100 UNIT/ML INJECT 10-12 UNITS INTO THE SKIN DAILY. 15 mL 0   insulin lispro (HUMALOG KWIKPEN) 100 UNIT/ML KwikPen INJECT UP TO 20 UNITS INTO THE SKIN DAILY AS ADVISED. 30 mL 3   Insulin Pen Needle (BD PEN NEEDLE NANO 2ND GEN) 32G X 4 MM MISC USE AS DIRECTED FIVE TIMES DAILY 400 each 3   prenatal vitamin w/FE, FA (PRENATAL 1 + 1) 27-1 MG TABS tablet Take 1 tablet by mouth daily at 12 noon. 30 tablet 12   No current facility-administered medications on file prior to visit.   Allergies  Allergen Reactions   Penicillins Rash    Has patient had a PCN reaction causing immediate rash, facial/tongue/throat swelling, SOB or lightheadedness with hypotension: Yes Has patient had a PCN reaction causing severe rash involving mucus membranes or skin necrosis: No Has patient had a PCN reaction that required hospitalization No Has patient had a PCN reaction occurring within the last 10 years: No If all of the above answers are "NO", then may proceed with  Cephalosporin use. Childhood allergy     Family History  Problem Relation Age of Onset   Diabetes Father    Kidney disease Father    Diabetes Sister    Diabetes Brother    Breast cancer Neg Hx    PE: BP 128/78 (BP Location: Left Arm, Patient Position: Sitting, Cuff Size: Normal)   Pulse 78   Ht 5\' 5"  (1.651 m)   Wt 160 lb (72.6 kg)  LMP 11/15/2021 (Exact Date) Comment: Irreg due to IUD removal  SpO2 99%   BMI 26.63 kg/m  Wt Readings from Last 3 Encounters:  02/24/22 157 lb (71.2 kg)  11/19/21 153 lb 6.4 oz (69.6 kg)  07/24/21 153 lb 6.4 oz (69.6 kg)   Constitutional: normal weight, in NAD Eyes: EOMI, no exophthalmos ENT: no thyromegaly, no cervical lymphadenopathy Cardiovascular: RRR, No MRG Respiratory: CTA B, no crackles, no rhonchi Musculoskeletal: no deformities Skin: no rashes Neurological: no tremor with outstretched hands Diabetic Foot Exam - Simple   Simple Foot Form Diabetic Foot exam was performed with the following findings: Yes 03/26/2022  8:48 AM  Visual Inspection No deformities, no ulcerations, no other skin breakdown bilaterally: Yes Sensation Testing Intact to touch and monofilament testing bilaterally: Yes Pulse Check Posterior Tibialis and Dorsalis pulse intact bilaterally: Yes Comments    ASSESSMENT: 1. DM1, uncontrolled, without long term complications, but with hypo-/hyperglycemia  We confirmed DM1: Component     Latest Ref Rng 02/09/2015  C-Peptide     0.80 - 3.90 ng/mL 0.24 (L)  Glucose, Fasting     65 - 99 mg/dL 64 (L)  Glutamic Acid Decarb Ab     <5 IU/mL 42 (H)  Pancreatic Islet Cell Antibody     < 5 JDF Units <5   2. HL  PLAN:  1. Patient with history of uncontrolled type 1 diabetes, on basal/bolus insulin regimen, with suboptimal control.  She had an HbA1c obtained 1 month ago and this was 7.3%, which is higher than the one obtained at last visit.  Patient is currently pregnant, in the second trimester. -We discussed about  targets for HbA1c during pregnancy: Ideally lower than 6.5%, also fasting glucose less than 95, postprandial glucose: 1h: <140, 2h: <120 -At last visit we adjusted her Basaglar dose.  She was taking 10 units in the morning, and we discussed about splitting the dose into 7 units in a.m. and 3 units at bedtime.  We also discussed about how to vary the dose of Humalog based on the size and consistency of her meals.  We also discussed about using the glucometer to check blood sugars after correcting a high or low.  I advised her to take glucose tablets if she already injected insulin per meal and ended up not eating all of it.  At that time, HbA1c was better, at 7.1%. CGM interpretation: -At today's visit, we reviewed her CGM downloads: It appears that 76% of values are in target range (goal >70%), while 21% are higher than 180 (goal <25%), and 3% are lower than 70 (goal <4%).  The calculated average blood sugar is 141.  The projected HbA1c for the next 3 months (GMI) is 6.7%. -Reviewing the CGM trends, sugars appear to be much better controlled compared to the previous visits based on reviewing the values from approximately a month ago.  Sugars are higher after approximately 6 AM, and per her report, this is likely due to dawn phenomenon.  After eating breakfast, sugars are dropping more or less abruptly and then increasing after lunch.  In the last 3 days, sugars have been more uncontrolled but the trend of lower blood sugars after breakfast and higher blood sugars after lunch persists.  I advised her to take a slightly lower dose of insulin before breakfast and a slightly higher dose before lunch.  We also increased her Lantus dose at night to allow her to have blood sugars better controlled overnight - I suggested to:  Patient Instructions  Please use the following regimen: - Basaglar 7 units in am and 4-5 units at bedtime - Humalog:  4-5 units 15-30 before tea in am  6-7 units before lunch 5 units  before dinner  At night, do not use more than 1-2 units NovoLog for correction. When you correct a low, do not use more then 15-30 g carbs.  Please return in 1-1.5 months.  - advised to check sugars at different times of the day - 4x a day, rotating check times - strongly advised her for yearly eye exams >> she is not UTD - return to clinic in 1-1.5 months   2. HL -Reviewed latest lipid panel from 11/2021: Fractions at goal: Lab Results  Component Value Date   CHOL 146 11/19/2021   HDL 50.10 11/19/2021   LDLCALC 84 11/19/2021   TRIG 58.0 11/19/2021   CHOLHDL 3 11/19/2021  -she is not on a statin.  I discussed with the patient to contact her PCP regarding her cough (no fever, lungs clear to auscultation).  She does not PCP, but will contact her OB/GYN doctor.  Philemon Kingdom, MD PhD [facility] Endocrinology

## 2022-03-30 ENCOUNTER — Other Ambulatory Visit: Payer: Self-pay | Admitting: Internal Medicine

## 2022-03-30 DIAGNOSIS — E1065 Type 1 diabetes mellitus with hyperglycemia: Secondary | ICD-10-CM

## 2022-03-31 ENCOUNTER — Encounter: Payer: Self-pay | Admitting: *Deleted

## 2022-03-31 ENCOUNTER — Other Ambulatory Visit: Payer: Self-pay | Admitting: *Deleted

## 2022-03-31 ENCOUNTER — Ambulatory Visit: Payer: Medicaid Other | Attending: Advanced Practice Midwife

## 2022-03-31 ENCOUNTER — Ambulatory Visit: Payer: Medicaid Other | Admitting: *Deleted

## 2022-03-31 VITALS — BP 114/66 | HR 77

## 2022-03-31 DIAGNOSIS — D649 Anemia, unspecified: Secondary | ICD-10-CM

## 2022-03-31 DIAGNOSIS — O99012 Anemia complicating pregnancy, second trimester: Secondary | ICD-10-CM

## 2022-03-31 DIAGNOSIS — Z362 Encounter for other antenatal screening follow-up: Secondary | ICD-10-CM

## 2022-03-31 DIAGNOSIS — O24012 Pre-existing diabetes mellitus, type 1, in pregnancy, second trimester: Secondary | ICD-10-CM | POA: Diagnosis not present

## 2022-03-31 DIAGNOSIS — E1065 Type 1 diabetes mellitus with hyperglycemia: Secondary | ICD-10-CM | POA: Diagnosis not present

## 2022-03-31 DIAGNOSIS — O0990 Supervision of high risk pregnancy, unspecified, unspecified trimester: Secondary | ICD-10-CM

## 2022-03-31 DIAGNOSIS — Z98891 History of uterine scar from previous surgery: Secondary | ICD-10-CM

## 2022-03-31 DIAGNOSIS — O34219 Maternal care for unspecified type scar from previous cesarean delivery: Secondary | ICD-10-CM

## 2022-03-31 DIAGNOSIS — O09522 Supervision of elderly multigravida, second trimester: Secondary | ICD-10-CM | POA: Diagnosis not present

## 2022-03-31 DIAGNOSIS — O24112 Pre-existing diabetes mellitus, type 2, in pregnancy, second trimester: Secondary | ICD-10-CM

## 2022-03-31 DIAGNOSIS — Z3A18 18 weeks gestation of pregnancy: Secondary | ICD-10-CM

## 2022-04-02 ENCOUNTER — Ambulatory Visit (INDEPENDENT_AMBULATORY_CARE_PROVIDER_SITE_OTHER): Payer: Medicaid Other | Admitting: Obstetrics & Gynecology

## 2022-04-02 ENCOUNTER — Encounter: Payer: Self-pay | Admitting: Obstetrics & Gynecology

## 2022-04-02 ENCOUNTER — Other Ambulatory Visit: Payer: Self-pay

## 2022-04-02 VITALS — BP 112/62 | HR 76 | Wt 160.2 lb

## 2022-04-02 DIAGNOSIS — Z3A18 18 weeks gestation of pregnancy: Secondary | ICD-10-CM

## 2022-04-02 DIAGNOSIS — O0990 Supervision of high risk pregnancy, unspecified, unspecified trimester: Secondary | ICD-10-CM

## 2022-04-02 DIAGNOSIS — J329 Chronic sinusitis, unspecified: Secondary | ICD-10-CM

## 2022-04-02 DIAGNOSIS — O09522 Supervision of elderly multigravida, second trimester: Secondary | ICD-10-CM

## 2022-04-02 DIAGNOSIS — Z98891 History of uterine scar from previous surgery: Secondary | ICD-10-CM

## 2022-04-02 DIAGNOSIS — O24012 Pre-existing diabetes mellitus, type 1, in pregnancy, second trimester: Secondary | ICD-10-CM

## 2022-04-02 DIAGNOSIS — O0992 Supervision of high risk pregnancy, unspecified, second trimester: Secondary | ICD-10-CM

## 2022-04-02 MED ORDER — AZITHROMYCIN 250 MG PO TABS: ORAL_TABLET | ORAL | 1 refills | Status: DC

## 2022-04-02 NOTE — Patient Instructions (Signed)

## 2022-04-02 NOTE — Progress Notes (Signed)
   PRENATAL VISIT NOTE  Subjective:  Theresa Koch is a 42 y.o. G4P3003 at [redacted]w[redacted]d being seen today for ongoing prenatal care.  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  Patient reports no complaints.  Contractions: Not present. Vag. Bleeding: None.  Movement: Absent. Denies leaking of fluid.   The following portions of the patient's history were reviewed and updated as appropriate: allergies, current medications, past family history, past medical history, past social history, past surgical history and problem list.   Objective:   Vitals:   04/02/22 1418  BP: 112/62  Pulse: 76  Weight: 160 lb 3.2 oz (72.7 kg)    Fetal Status: Fetal Heart Rate (bpm): 158   Movement: Absent     General:  Alert, oriented and cooperative. Patient is in no acute distress.  Skin: Skin is warm and dry. No rash noted.   Cardiovascular: Normal heart rate noted  Respiratory: Normal respiratory effort, no problems with respiration noted  Abdomen: Soft, gravid, appropriate for gestational age.  Pain/Pressure: Absent     Pelvic: Cervical exam deferred        Extremities: Normal range of motion.  Edema: None  Mental Status: Normal mood and affect. Normal behavior. Normal judgment and thought content.   Assessment and Plan:  Pregnancy: G4P3003 at [redacted]w[redacted]d 1. Type 1 diabetes mellitus during pregnancy in second trimester Followed and managed by Endocrinology, Dr. Cruzita Lederer. Appreciate her expertise and recommendations.  Continue growth scans as per MFM.  2. History of 2 cesarean sections Interested in  RCS  3. AMA (advanced maternal age) multigravida 40+, second trimester Low risk NIPS, normal anatomy scan  4. Other sinusitis, unspecified chronicity Concerned about possible sinus infection, Z pack prescribed. Told to come to [facility] for worsening symptoms. List of safe meds in pregnancy given to her to help with pain and URI symptoms.  - azithromycin (ZITHROMAX) 250 MG tablet; Take as directed: Two pills by mouth the first day, then one pill every day until completed  Dispense: 6 tablet; Refill: 1  5. [redacted] weeks gestation of pregnancy 6. Supervision of high risk pregnancy, antepartum No other complaints or concerns.  Routine obstetric precautions reviewed.  Please refer to After Visit Summary for other counseling recommendations.   Return in about 4 weeks (around 04/30/2022) for OFFICE OB VISIT (MD only).  Future Appointments  Date Time Provider [place]  04/28/2022 10:30 AM [facility] NURSE [facility]  04/28/2022 10:45 AM [facility]  05/08/2022 10:20 AM Philemon Kingdom, MD [facility] None    Verita Schneiders, MD

## 2022-04-02 NOTE — Progress Notes (Signed)
Pt states having severe cough w/ pain in chest, pain on left side of face.

## 2022-04-03 ENCOUNTER — Other Ambulatory Visit (HOSPITAL_COMMUNITY): Payer: Self-pay

## 2022-04-03 NOTE — Telephone Encounter (Signed)
Patient stating that PA should be able to be processed now.

## 2022-04-07 ENCOUNTER — Other Ambulatory Visit (HOSPITAL_COMMUNITY): Payer: Self-pay

## 2022-04-09 NOTE — Telephone Encounter (Signed)
Sent patient mychart message as there was no answer by phone

## 2022-04-10 ENCOUNTER — Other Ambulatory Visit (HOSPITAL_COMMUNITY): Payer: Self-pay

## 2022-04-10 ENCOUNTER — Telehealth: Payer: Self-pay

## 2022-04-10 DIAGNOSIS — J329 Chronic sinusitis, unspecified: Secondary | ICD-10-CM

## 2022-04-10 NOTE — Telephone Encounter (Signed)
Pt states she spoke to case worker and they advised a new PA is all they need for her to get her [place] sensors. Can we submit request.

## 2022-04-10 NOTE — Telephone Encounter (Signed)
Patient called Theresa Koch. (Patient coordinator) stating that patient was having difficulty with sending MyChart message; patient asked Earnest Bailey to relay message to nurse.   Patient recently seen on 04/02/22 for prenatal visit; at visit, patient Dx'd with sinusitis and prescribed azithromycin. Per patient, she did not tolerate azithromycin well (increased nausea and GI upset) and she reports still having symptoms of sinusitis. Patient was wanting to know if we can prescribe her something different.   RN told [employer]. That I would create an encounter and consult with provider on her behalf since her [place] messaging isnt working, and we will message her with a plan/new prescription.   [facility] message to patient verbally.   Adonis Huguenin RN on 04/10/22 at [phone]

## 2022-04-11 ENCOUNTER — Other Ambulatory Visit (HOSPITAL_COMMUNITY): Payer: Self-pay

## 2022-04-11 NOTE — Telephone Encounter (Signed)
Patient is allergic to penicillins, is it still okay for her to take Augmentin? Maureen Ralphs RN

## 2022-04-11 NOTE — Telephone Encounter (Signed)
Pharmacy Patient Advocate Encounter  Prior Authorization for [employer] 2 Sensor  has been approved by [employer]  (ins).    PA # [id number]  Effective dates: 04/10/22 through 10/10/22

## 2022-04-14 MED ORDER — ONDANSETRON HCL 4 MG PO TABS: 4.0000 mg | ORAL_TABLET | Freq: Three times a day (TID) | ORAL | 0 refills | Status: DC | PRN

## 2022-04-14 MED ORDER — PROMETHAZINE HCL 25 MG PO TABS
25.0000 mg | ORAL_TABLET | Freq: Four times a day (QID) | ORAL | 1 refills | Status: DC | PRN
Start: 2022-04-14 — End: 2022-08-07

## 2022-04-14 MED ORDER — AZITHROMYCIN 250 MG PO TABS: ORAL_TABLET | ORAL | 1 refills | Status: DC

## 2022-04-14 NOTE — Telephone Encounter (Signed)
Reordered original azithromycin Rx along with Rx for nausea/vomiting according to standing protocol per provider (Dr. Macon Large). Message sent to patient via MyChart.   Maureen Ralphs RN on 04/14/22 at [phone]

## 2022-04-28 ENCOUNTER — Ambulatory Visit: Payer: Medicaid Other | Admitting: *Deleted

## 2022-04-28 ENCOUNTER — Other Ambulatory Visit: Payer: Self-pay | Admitting: *Deleted

## 2022-04-28 ENCOUNTER — Ambulatory Visit: Payer: Medicaid Other | Attending: Maternal & Fetal Medicine | Admitting: Maternal & Fetal Medicine

## 2022-04-28 ENCOUNTER — Ambulatory Visit: Payer: Medicaid Other | Attending: Obstetrics and Gynecology

## 2022-04-28 VITALS — BP 102/54 | HR 73

## 2022-04-28 DIAGNOSIS — Z98891 History of uterine scar from previous surgery: Secondary | ICD-10-CM

## 2022-04-28 DIAGNOSIS — E1065 Type 1 diabetes mellitus with hyperglycemia: Secondary | ICD-10-CM | POA: Diagnosis not present

## 2022-04-28 DIAGNOSIS — O24012 Pre-existing diabetes mellitus, type 1, in pregnancy, second trimester: Secondary | ICD-10-CM

## 2022-04-28 DIAGNOSIS — O34219 Maternal care for unspecified type scar from previous cesarean delivery: Secondary | ICD-10-CM

## 2022-04-28 DIAGNOSIS — O24112 Pre-existing diabetes mellitus, type 2, in pregnancy, second trimester: Secondary | ICD-10-CM | POA: Diagnosis present

## 2022-04-28 DIAGNOSIS — Z3A22 22 weeks gestation of pregnancy: Secondary | ICD-10-CM

## 2022-04-28 DIAGNOSIS — O0990 Supervision of high risk pregnancy, unspecified, unspecified trimester: Secondary | ICD-10-CM

## 2022-04-28 DIAGNOSIS — O09529 Supervision of elderly multigravida, unspecified trimester: Secondary | ICD-10-CM

## 2022-04-28 DIAGNOSIS — D649 Anemia, unspecified: Secondary | ICD-10-CM

## 2022-04-28 DIAGNOSIS — O99012 Anemia complicating pregnancy, second trimester: Secondary | ICD-10-CM | POA: Diagnosis present

## 2022-04-28 DIAGNOSIS — O09522 Supervision of elderly multigravida, second trimester: Secondary | ICD-10-CM

## 2022-04-28 DIAGNOSIS — Z362 Encounter for other antenatal screening follow-up: Secondary | ICD-10-CM

## 2022-04-28 DIAGNOSIS — O24019 Pre-existing diabetes mellitus, type 1, in pregnancy, unspecified trimester: Secondary | ICD-10-CM

## 2022-04-28 NOTE — Progress Notes (Signed)
Patient information  Patient Name: Theresa Koch [facility]  Patient MRN:   [id number]  Referring practice: MFM Referring Provider: [facility])  MFM CONSULT  Theresa Koch is a 42 y.o. [phone] at [redacted]w[redacted]d here for ultrasound and consultation.   RE type I DM: The patient has had difficulty controlling her blood sugar this pregnancy.  She reports low blood sugar in the 50s as well as elevated blood sugar in the 280s.  She follows through endocrinology and her next visit is in May but I recommend she contact them today to have sooner follow-up appointment.  I discussed the complications associated with uncontrolled type 1 diabetes in pregnancy including but not limited to fetal growth abnormalities, increased risk of stillbirth and early delivery requiring newborn intensive care services.  The patient verbalized understanding and knows the importance of good glycemic control during pregnancy.  Also discussed the gust the risk of fetal congenital heart disease and the need for fetal echo.  This has been arranged for 05/01/2022.  Sonographic findings Single intrauterine pregnancy. Fetal cardiac activity:  Observed and appears normal. Presentation: Cephalic. The anatomic structures that were well seen appear normal without evidence of soft markers. Due to poor acoustic windows, the visualization some structures remain suboptimally seen. Fetal biometry shows the estimated fetal weight is consistent with EDD.  Amniotic fluid volume: Within normal limits. MVP: 4.74 cm. Placenta: Posterior.  Assessment AMA (advanced maternal age) multigravida 74+, second trimester Type 1 diabetes mellitus during pregnancy in second trimester History of 2 cesarean sections  Plan -Detailed ultrasound was done today without abnormalities. -Baseline preeclampsia labs: CMP, CBC and urine protein/creatinine ratio if not previously completed.  -Continue to follow-up with OB and endocrine.   -Aspirin 81 mg for preeclampsia prophylasis -Follow-up anatomy and fetal growth in 4 to 6 weeks -Serial growth ultrasounds starting around 28 weeks to monitor for fetal growth restriction -Fetal echo has been arranged for 05/01/22 -Antenatal testing to start around 30-32 weeks due to the increased risk of stillbirth and high risk pregnancy.  Due to her poor control I have ordered twice weekly nonstress tests with a weekly biophysical profile. -Delivery timing pending clinical course but likely around 37 weeks gestion -Continue routine prenatal care with referring OB provider  Review of Systems: A review of systems was performed and was negative except per HPI   Vitals and Physical Exam    04/28/2022   10:47 AM 04/02/2022    2:18 PM 03/31/2022    7:47 AM  Vitals with BMI  Weight  160 lbs 3 oz   BMI  26.66   Systolic 102 112 147  Diastolic 54 62 66  Pulse 73 76 77    Sitting comfortably on the sonogram table Nonlabored breathing Normal rate and rhythm Abdomen is nontender  Past pregnancies OB History  Gravida Para Term Preterm AB Living  SAB IAB Ectopic Multiple Live Births        0 3    # Outcome Date GA Lbr Len/2nd Weight Sex Delivery Anes PTL Lv  4 Current           3 Term 05/09/16 [redacted]w[redacted]d  7 lb 4.4 oz (3.3 kg) F CS-LTranv Spinal  LIV  2 Term 08/09/14 [redacted]w[redacted]d / 01:32 7 lb 9.2 oz (3.436 kg) F Vag-Spont EPI  LIV  1 Term 02/28/12 [redacted]w[redacted]d  5 lb 15.2 oz (2.7 kg) F CS-LTranv EPI  N LIV    Obstetric Comments  Previous cs for diabetes    I spent 45 minutes reviewing the patients chart, including labs and images as well as counseling the patient about her medical conditions. Greater than 50% of the time was spent in direct face-to-face patient counseling.  Braxton Feathers  MFM, [place]   04/28/2022  1:24 PM

## 2022-05-01 ENCOUNTER — Encounter: Payer: Medicaid Other | Admitting: Family Medicine

## 2022-05-08 ENCOUNTER — Ambulatory Visit (INDEPENDENT_AMBULATORY_CARE_PROVIDER_SITE_OTHER): Payer: Medicaid Other | Admitting: Internal Medicine

## 2022-05-08 ENCOUNTER — Encounter: Payer: Self-pay | Admitting: Internal Medicine

## 2022-05-08 VITALS — BP 100/68 | HR 79 | Ht 65.0 in | Wt 163.4 lb

## 2022-05-08 DIAGNOSIS — E1065 Type 1 diabetes mellitus with hyperglycemia: Secondary | ICD-10-CM | POA: Diagnosis not present

## 2022-05-08 DIAGNOSIS — E7800 Pure hypercholesterolemia, unspecified: Secondary | ICD-10-CM | POA: Diagnosis not present

## 2022-05-08 MED ORDER — BASAGLAR KWIKPEN 100 UNIT/ML ~~LOC~~ SOPN
10.0000 [IU] | PEN_INJECTOR | Freq: Every day | SUBCUTANEOUS | 11 refills | Status: DC
Start: 2022-05-08 — End: 2022-07-29

## 2022-05-08 NOTE — Patient Instructions (Addendum)
Please use the following regimen: - Basaglar 7 units in am and 3-4 units at bedtime - Humalog:  4-5 units 15-30 before tea in am  6-7 units before lunch 2-3 units before dinner  At night, do not use more than 1-2 units NovoLog for correction. When you correct a low, do not use more then 15-30 g carbs.  Please return in 1.5 months.

## 2022-05-08 NOTE — Progress Notes (Signed)
Patient ID: Theresa Koch, female   DOB: 10/23/1980, 42 y.o.   MRN: [id number]  HPI: Theresa Koch is a 42 y.o.-year-old female, initially referred by Dr. Thornton Papas, returning for f/u for DM1, dx in 2011, but at DM1 in 02/2015, insulin-dependent since 2013, uncontrolled, without long term complications. Last visit 4 months ago.   Interim history: Patient is currently pregnant, [redacted] weeks along. EDD: 08/30/22. No increased urination, blurry vision, but she has nausea - improving a little. She had problems tolerating meals during pregnancy, but this is now improved.  Reviewed HbA1c levels: Lab Results  Component Value Date   HGBA1C 7.3 (H) 02/24/2022   HGBA1C 7.1 (A) 11/19/2021   HGBA1C 7.8 (A) 07/24/2021   HGBA1C 8.2 (A) 03/15/2021   HGBA1C 7.7 (A) 11/02/2020   HGBA1C 7.9 (A) 05/31/2020   HGBA1C 8.1 (A) 11/25/2019   HGBA1C 8.0 (A) 01/11/2019   HGBA1C 8.8 (A) 03/22/2018   HGBA1C 7.9 (A) 09/04/2017   HGBA1C 7.8 04/10/2017   HGBA1C 6.0 03/10/2016   HGBA1C 5.6 12/14/2015   HGBA1C 7.2 09/14/2015   HGBA1C 7.1 05/23/2015   HGBA1C 8.1 (H) 02/09/2015   HGBA1C 6.5 08/29/2014   HGBA1C 6.2 05/12/2014   HGBA1C 7.6 (H) 02/14/2014   HGBA1C 8.8 12/19/2013    She is on:  - Basaglar 10 units in am >> split: 7 units in am and 3 units at bedtime >> 7 units in am and 4-5 units at bedtime (may skip eve. Dose) - Humalog:  3-4 >> 5-6 units 15-30 >> 4-5 units 15-30 before tea in am  3-4 (5) >> 5 >> 6-7 units before lunch 3-4 units before dinner (may need  2 glucose tablets if you eat less than planned and took the 4 unit dose) >> 5 >> 3-5 units before dinner At night, do not use more than 1-2 units NovoLog for correction. When you correct a low, do not use more then 15-30 g carbs.  We stopped Metformin XR 500 mg 3x a day with meals. We stopped Glipizide XL 5 mg in am.  Pt.checks her sugar is more than 4 times a day with her libre CGM:   Previously:  Blood for the last 3  days:  Previously:   Lowest sugar was 40 >> ... 50s >> 40s at night >> 50s ; she has hypoglycemia awareness in the 30s. Highest sugar was 300s >> 270s >> 200s.  Glucometer: One Touch Ultra  Pt's meals are: - Breakfast: tea with milk + cookies >> tea - Lunch: salad, soup, cheese  - Dinner: egg, cheese, meat, sometimes cereals  - after dinner: milk >> stopped   No CKD; latest BUN/creatinine: Lab Results  Component Value Date   BUN 18 11/19/2021   Lab Results  Component Value Date   CREATININE 0.61 11/19/2021   No MAU: Lab Results  Component Value Date   MICRALBCREAT 1.0 11/19/2021   MICRALBCREAT 1.6 11/02/2020   MICRALBCREAT 1.1 11/25/2019   MICRALBCREAT 1.1 01/11/2019   MICRALBCREAT 0.9 04/10/2017   MICRALBCREAT 0.7 05/23/2015  She is not on ACE inhibitor/ARB.  + Mild HL: Lab Results  Component Value Date   CHOL 146 11/19/2021   HDL 50.10 11/19/2021   LDLCALC 84 11/19/2021   TRIG 58.0 11/19/2021   CHOLHDL 3 11/19/2021  She is not on a statin.  - last eye exam was in 01/2021: No DR reportedly.   - no numbness and tingling in her feet.  Foot exam performed 03/26/2022.  Latest TSH was reviewed and this was normal: Lab Results  Component Value Date   TSH 1.73 11/19/2021   ROS: + see HPI  I reviewed pt's medications, allergies, PMH, social hx, family hx, and changes were documented in the history of present illness. Otherwise, unchanged from my initial visit note.  Past Medical History:  Diagnosis Date   Diabetes mellitus without complication (HCC)    Previous cesarean section 10/30/2014   TOLAC successful 2016    Seasonal allergies    Type 1 diabetes mellitus with hyperglycemia, with long-term current use of insulin (HCC) 05/23/2015   Past Surgical History:  Procedure Laterality Date   CESAREAN SECTION     CESAREAN SECTION N/A 05/09/2016   Procedure: CESAREAN SECTION;  Surgeon: Carrington Clamp, MD;  Location: [facility] BIRTHING SUIT[facility];  Service: Obstetrics;   Laterality: N/A;   History   Social History   Marital Status: Married    Spouse Name: N/A    Number of Children: 1   Occupational History   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 DX CODE: O24.912 200 each 3   aspirin EC 81 MG tablet Take 1 tablet (81 mg total) by mouth daily. Take after 12 weeks for prevention of preeclampssia later in pregnancy (Patient not taking: Reported on 03/31/2022) 300 tablet 2   Blood Glucose Monitoring Suppl (ACCU-CHEK GUIDE) w/Device KIT 1 Device by Does not apply route as needed. (Patient not taking: Reported on 04/02/2022) 1 kit 2   Continuous Blood Gluc Receiver (FREESTYLE LIBRE 2 READER) DEVI 1 EACH BY DOES NOT APPLY ROUTE ONCE FOR 1 DOSE. 1 each 0   Continuous Blood Gluc Sensor (FREESTYLE LIBRE 2 SENSOR) MISC CHANGE EVERY 14 DAYS 2 each 5   Glucagon 3 MG/DOSE POWD Place 3 mg into the nose once as needed for up to 1 dose. 1 each 11   glucose blood test strip Use as instructed 100 each 12   Insulin Glargine (BASAGLAR KWIKPEN) 100 UNIT/ML INJECT 10-12 UNITS INTO THE SKIN DAILY. 15 mL 0   insulin lispro (HUMALOG KWIKPEN) 100 UNIT/ML KwikPen INJECT UP TO 20 UNITS INTO THE SKIN DAILY AS ADVISED. 30 mL 3   Insulin Pen Needle (BD PEN NEEDLE NANO 2ND GEN) 32G X 4 MM MISC USE AS DIRECTED FIVE TIMES DAILY 400 each 3   ondansetron (ZOFRAN) 4 MG tablet Take 1 tablet (4 mg total) by mouth every 8 (eight) hours as needed for nausea or vomiting. (Patient not taking: Reported on 04/28/2022) 20 tablet 0   prenatal vitamin w/FE, FA (PRENATAL 1 + 1) 27-1 MG TABS tablet Take 1 tablet by mouth daily at 12 noon. (Patient not taking: Reported on 03/31/2022) 30 tablet 12    promethazine (PHENERGAN) 25 MG tablet Take 1 tablet (25 mg total) by mouth every 6 (six) hours as needed for nausea or vomiting. (Patient not taking: Reported on 04/28/2022) 30 tablet 1   No current facility-administered medications on file prior to visit.   Allergies  Allergen Reactions   Penicillins Rash    Has patient had a PCN reaction causing immediate rash, facial/tongue/throat swelling, SOB or lightheadedness with hypotension:  Yes Has patient had a PCN reaction causing severe rash involving mucus membranes or skin necrosis: No Has patient had a PCN reaction that required hospitalization No Has patient had a PCN reaction occurring within the last 10 years: No If all of the above answers are "NO", then may proceed with Cephalosporin use. Childhood allergy     Family History  Problem Relation Age of Onset   Diabetes Father    Kidney disease Father    Diabetes Sister    Diabetes Brother    Breast cancer Neg Hx    PE: BP 100/68 (BP Location: Right Arm, Patient Position: Sitting, Cuff Size: Normal)   Pulse 79   Ht 5\' 5"  (1.651 m)   Wt 163 lb 6.4 oz (74.1 kg)   LMP 11/15/2021 (Exact Date) Comment: Irreg due to IUD removal  SpO2 99%   BMI 27.19 kg/m  Wt Readings from Last 3 Encounters:  05/08/22 163 lb 6.4 oz (74.1 kg)  04/02/22 160 lb 3.2 oz (72.7 kg)  03/26/22 160 lb (72.6 kg)   Constitutional: normal weight, in NAD Eyes: EOMI, no exophthalmos ENT: no thyromegaly, no cervical lymphadenopathy Cardiovascular: RRR, No MRG Respiratory: CTA B, no crackles, no rhonchi Musculoskeletal: no deformities Skin: no rashes Neurological: no tremor with outstretched hands  ASSESSMENT: 1. DM1, uncontrolled, without long term complications, but with hypo-/hyperglycemia  We confirmed DM1: Component     Latest Ref Rng 02/09/2015  C-Peptide     0.80 - 3.90 ng/mL 0.24 (L)  Glucose, Fasting     65 - 99 mg/dL 64 (L)  Glutamic Acid Decarb Ab     <5 IU/mL 42 (H)  Pancreatic Islet Cell  Antibody     < 5 JDF Units <5   2. HL  PLAN:  1. Patient with history of uncontrolled type 1 diabetes on basal/bolus insulin regimen, with suboptimal control, now pregnant in the second trimester.  Before last visit, HbA1c was higher, at 7.3%, however, at the time of the visit, sugars appears to be improved.  At that time, we adjusted her Basaglar and Humalog doses.  We also discussed about targets for HbA1c during pregnancy: Ideally lower than 6.5%, also fasting glucose less than 95, postprandial glucose: 1h: <140, 2h: <120 CGM interpretation: -At today's visit, we reviewed her CGM downloads: It appears that 77% of values are in target range (goal >70%), while 15% are higher than 180 (goal <25%), and 8% are lower than 70 (goal <4%).  The calculated average blood sugar is 128.  The projected HbA1c for the next 3 months (GMI) is 6.4%. -Reviewing the CGM trends, sugars are better than previous, definitely improved throughout the day, but with more lows during the second half of the night, with a nadir around 3 AM.  She then corrects the lows and sugars started to increase spiking around 9 AM, in the 200s.  We did discuss about trying not to overcorrect low blood sugars and I recommended to use just 15 g of carbs for correction.  She is currently using jam but we discussed that we do not know how many calories this has and I recommended to switch to half a cup of juice (15 g of carbs).  However, we need to make sure that she is not dropping her sugars in the middle of the night so we discussed about reducing the dose of insulin with dinner and using a lower dose of Basaglar.  I am worried that if she is not taking Basaglar at  night at all, sugars will be even higher in the morning.  Otherwise, we can continue the insulin doses and the rest of the day. - I suggested to:  Patient Instructions  Please use the following regimen: - Basaglar 7 units in am and 3-4 units at bedtime - Humalog:  4-5 units 15-30  before tea in am  6-7 units before lunch 2-3 units before dinner  At night, do not use more than 1-2 units NovoLog for correction. When you correct a low, do not use more then 15-30 g carbs.  Please return in 1.5 months.  - advised to check sugars at different times of the day - >4x a day, rotating check times - advised for yearly eye exams >> she is not UTD - return to clinic in 1.5 months   2. HL -Reviewed latest lipid panel from 11/2021: Fractions at goal: Lab Results  Component Value Date   CHOL 146 11/19/2021   HDL 50.10 11/19/2021   LDLCALC 84 11/19/2021   TRIG 58.0 11/19/2021   CHOLHDL 3 11/19/2021  -She is not on a statin  Carlus Pavlov, MD PhD [facility] Endocrinology

## 2022-05-13 ENCOUNTER — Encounter: Payer: Medicaid Other | Admitting: Obstetrics & Gynecology

## 2022-05-15 ENCOUNTER — Other Ambulatory Visit: Payer: Self-pay

## 2022-05-15 ENCOUNTER — Ambulatory Visit (INDEPENDENT_AMBULATORY_CARE_PROVIDER_SITE_OTHER): Payer: Medicaid Other | Admitting: Family Medicine

## 2022-05-15 VITALS — BP 107/72 | HR 91 | Wt 161.1 lb

## 2022-05-15 DIAGNOSIS — O24012 Pre-existing diabetes mellitus, type 1, in pregnancy, second trimester: Secondary | ICD-10-CM

## 2022-05-15 DIAGNOSIS — Z3A24 24 weeks gestation of pregnancy: Secondary | ICD-10-CM

## 2022-05-15 DIAGNOSIS — J329 Chronic sinusitis, unspecified: Secondary | ICD-10-CM

## 2022-05-15 DIAGNOSIS — O0990 Supervision of high risk pregnancy, unspecified, unspecified trimester: Secondary | ICD-10-CM

## 2022-05-15 DIAGNOSIS — Z98891 History of uterine scar from previous surgery: Secondary | ICD-10-CM

## 2022-05-15 DIAGNOSIS — O09522 Supervision of elderly multigravida, second trimester: Secondary | ICD-10-CM

## 2022-05-15 NOTE — Progress Notes (Signed)
   PRENATAL VISIT NOTE  Subjective:  Theresa Koch is a 42 y.o. G4P3003 at [redacted]w[redacted]d being seen today for ongoing prenatal care.  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  Patient reports no bleeding, no cramping, and no leaking.  Contractions: Not present. Vag. Bleeding: None.  Movement: Present. Denies leaking of fluid.   The following portions of the patient's history were reviewed and updated as appropriate: allergies, current medications, past family history, past medical history, past social history, past surgical history and problem list.   Objective:   Vitals:   05/15/22 0844  BP: 107/72  Pulse: 91  Weight: 161 lb 1.6 oz (73.1 kg)    Fetal Status: Fetal Heart Rate (bpm): 150   Movement: Present     General:  Alert, oriented and cooperative. Patient is in no acute distress.  Skin: Skin is warm and dry. No rash noted.   Cardiovascular: Normal heart rate noted  Respiratory: Normal respiratory effort, no problems with respiration noted  Abdomen: Soft, gravid, appropriate for gestational age.  Pain/Pressure: Absent     Pelvic: Cervical exam deferred        Extremities: Normal range of motion.  Edema: None  Mental Status: Normal mood and affect. Normal behavior. Normal judgment and thought content.   Assessment and Plan:  Pregnancy: G4P3003 at [redacted]w[redacted]d 1. Supervision of high risk pregnancy, antepartum Continue routine prenatal care  2. Type 1 diabetes mellitus during pregnancy in second trimester Following with endocrinology, appreciate their assistance in her care Continue scheduled growth scans per MFM  3. History of 2 cesarean sections Considering  repeat C-section  4. AMA (advanced maternal age) multigravida 55+, second trimester Continue routine monitoring per MFM  5. [redacted] weeks gestation of pregnancy  6. Other sinusitis, unspecified chronicity Patient with congestion rhinorrhea, postnasal drip which she reported started 2 to 3 days ago.  Discussed OTC medications and Nettie pot.  Patient reports she does not like to take medications during pregnancy but will try the Nettie pot.  Discussed if symptoms persist she may need another dose of antibiotics and patient understands.  Return precautions given  Preterm labor symptoms and general obstetric precautions including but not limited to vaginal bleeding, contractions, leaking of fluid and fetal movement were reviewed in detail with the patient. Please refer to After Visit Summary for other counseling recommendations.   No follow-ups on file.  Future Appointments  Date Time Provider Department Center  05/26/2022  8:35 AM Adam Phenix, MD [facility]  05/26/2022 10:00 AM [facility] LAB [facility]  05/28/2022  8:30 AM [facility] NURSE [facility]  05/28/2022  8:45 AM [facility] US6 [facility]  06/23/2022  8:30 AM [facility] NURSE [facility]  06/23/2022  8:45 AM [facility] US4 [facility]  06/30/2022 11:00 AM Carlus Pavlov, MD [facility] None  07/07/2022 10:30 AM [facility] NURSE [facility]  07/07/2022 10:45 AM [facility] NST [facility]  07/14/2022  9:30 AM [facility] NURSE [facility]  07/14/2022  9:45 AM [facility] NST [facility]  07/21/2022  8:30 AM [facility] NURSE [facility]  07/21/2022  8:45 AM [facility] US4 [facility]  07/21/2022  9:45 AM [facility] NST [facility]    Celedonio Savage, MD

## 2022-05-15 NOTE — Patient Instructions (Signed)

## 2022-05-25 NOTE — Progress Notes (Unsigned)
   PRENATAL VISIT NOTE  Subjective:  Theresa Koch is a 42 y.o. G4P3003 at [redacted]w[redacted]d being seen today for ongoing prenatal care.  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  Patient reports {sx:[id number]}. ***   .  .   . Denies leaking of fluid.   The following portions of the patient's history were reviewed and updated as appropriate: allergies, current medications, past family history, past medical history, past social history, past surgical history and problem list.   Objective:  There were no vitals filed for this visit.  Fetal Status:           General:  Alert, oriented and cooperative. Patient is in no acute distress.  Skin: Skin is warm and dry. No rash noted.   Cardiovascular: Normal heart rate noted  Respiratory: Normal respiratory effort, no problems with respiration noted  Abdomen: Soft, gravid, appropriate for gestational age.        Pelvic: {Blank single:19197::"Cervical exam performed in the presence of a chaperone","Cervical exam deferred"}        Extremities: Normal range of motion.     Mental Status: Normal mood and affect. Normal behavior. Normal judgment and thought content.   Assessment and Plan:  Pregnancy: G4P3003 at [redacted]w[redacted]d  1. Type 1 diabetes mellitus during pregnancy in second trimester  2. AMA (advanced maternal age) multigravida 68+, second trimester  3. History of 2 cesarean sections  4. Supervision of high risk pregnancy, antepartum  CBG logs *** ASA *** TOLAC ***  Preterm labor symptoms and general obstetric precautions including but not limited to vaginal bleeding, contractions, leaking of fluid and fetal movement were reviewed in  detail with the patient. Please refer to After Visit Summary for other counseling recommendations.   No follow-ups on file.  Future Appointments  Date Time Provider Department Center  05/26/2022  8:35 AM Myrtie Hawk, DO [facility]  05/26/2022 10:00 AM [facility] LAB [facility]  05/28/2022  8:30 AM [facility] NURSE [facility]  05/28/2022  8:45 AM [facility] US6 [facility]  06/23/2022  8:30 AM [facility] NURSE [facility]  06/23/2022  8:45 AM [facility] US4 [facility]  06/30/2022 11:00 AM Carlus Pavlov, MD [facility] None  07/07/2022 10:30 AM [facility] NURSE [facility]  07/07/2022 10:45 AM [facility] NST [facility]  07/14/2022  9:30 AM [facility] NURSE [facility]  07/14/2022  9:45 AM [facility] NST [facility]  07/21/2022  8:30 AM [facility] NURSE [facility]  07/21/2022  8:45 AM [facility] US4 [facility]  07/21/2022  9:45 AM [facility] NST [facility]    Myrtie Hawk, DO FMOB Fellow, Faculty practice [facility], [facility] 05/25/22  11:29 PM

## 2022-05-26 ENCOUNTER — Other Ambulatory Visit: Payer: Self-pay

## 2022-05-26 ENCOUNTER — Ambulatory Visit (INDEPENDENT_AMBULATORY_CARE_PROVIDER_SITE_OTHER): Payer: Medicaid Other | Admitting: Family Medicine

## 2022-05-26 ENCOUNTER — Other Ambulatory Visit: Payer: Medicaid Other

## 2022-05-26 VITALS — BP 100/62 | HR 77 | Wt 164.0 lb

## 2022-05-26 DIAGNOSIS — O0992 Supervision of high risk pregnancy, unspecified, second trimester: Secondary | ICD-10-CM

## 2022-05-26 DIAGNOSIS — O0990 Supervision of high risk pregnancy, unspecified, unspecified trimester: Secondary | ICD-10-CM

## 2022-05-26 DIAGNOSIS — Z3A26 26 weeks gestation of pregnancy: Secondary | ICD-10-CM

## 2022-05-26 DIAGNOSIS — O24012 Pre-existing diabetes mellitus, type 1, in pregnancy, second trimester: Secondary | ICD-10-CM

## 2022-05-26 DIAGNOSIS — O09522 Supervision of elderly multigravida, second trimester: Secondary | ICD-10-CM

## 2022-05-26 DIAGNOSIS — Z98891 History of uterine scar from previous surgery: Secondary | ICD-10-CM

## 2022-05-26 NOTE — Patient Instructions (Signed)
Spine Conditioning Program  To ensure that this program is safe and effective for you, it should be performed under your doctor's supervision. Talk to your doctor or physical therapist about which exercises will best help you meet your rehabilitation goals.  After an injury or surgery, an exercise conditioning program can help you return to daily activities and enjoy a more active, healthy lifestyle. Following a well-structured conditioning program can also help you return to sports and other recreational activities.  Strength: Strengthening the muscles that support your spine will help keep your back and upper body stable. Keeping these muscles strong can relieve back pain and prevent further injury.  Flexibility: Stretching the muscles that you strengthen is important for restoring range of motion and preventing injury. Gently stretching after strengthening exercises can help reduce muscle soreness and keep your muscles long and flexible.  Target Muscles: The muscle groups targeted in this conditioning program include:  Cervical spine (neck) External oblique rotators (side and lower back) Trapezius (neck and upper back) Internal oblique rotators (side and lower back) Latissimus dorsi (side and middle back) Piriformis (buttocks) Back extensors and erector spinae Gluteus maximus (buttocks) (middle and lower back) Gluteus medias (buttocks) Quadratus lumborum (lower back) Hamstrings (back of thigh) Abdominals Length of program: This spine conditioning program should be continued for 4 to 6 weeks, unless otherwise specified by your doctor or physical therapist. After your recovery, these exercises can be continued as a maintenance program for lifelong protection and health of your spine. Performing the exercises 2 to 3 days a week will maintain strength and range of motion in your back.  Related Articles  STAYING HEALTHY Preventing Back Pain at Work and at Home  STAYING HEALTHY Starting  an Exercise Program  Getting Started Warm up: Before doing the following exercises, warm up with 5 to 10 minutes of low impact activity, like walking or riding a stationary bicycle.  Stretch: After the warm-up, do the stretching exercises shown on Page 1 before moving on to the strengthening exercises. When you have completed the strengthening exercises, repeat the stretching exercises to end the program.  Do not ignore pain: You should not feel pain during an exercise. Talk to your doctor or physical therapist if you have any pain while exercising.  Ask questions: If you are not sure how to do an exercise, or how often to do it, contact your doctor or physical therapist.  1. Head Rolls Repetitions 3 sets of 3 Days per week Daily  Main muscles worked: Cervical spine muscles, trapezius You should feel this stretch all around your neck and into your upper back  Equipment needed: None  Step-by-step directions  Sit in a chair or stand with your weight evenly distributed on both feet. Gently bring your chin toward your chest. Roll your head to the right and turn so that your ear is over your shoulder (1). Hold for 5 seconds. Gently roll your head back toward your chest and to the left. Turn your head so that your ear is over your left shoulder (2). Hold for 5 seconds. Slowly roll your head back and in a clockwise circle 3 times (3). Reverse directions and slowly roll your head in a counterclockwise circle 3 times (4). Tip Do not shrug your shoulders up during this exercise.  head rolls Head rolls  2. Kneeling Back Extension Repetitions 10 Days per week Daily  Main muscles worked: Quadratus lumborum, erector spinae You should feel this stretch in your lower back and your abdominals  Equipment needed: None  Step-by-step directions  Begin on your hands and knees with your shoulders positioned over your hands. Rock forward onto your arms, round your shoulders and allow your lower  back to drop toward the floor. Hold for 5 seconds. Rock backward and sit your buttocks as close to your heels as possible. Extend your arms and hold for 5 seconds. Tip Look down on the floor to keep your neck in alignment with your spine.  Kneeling back extension Kneeling back extension  3. Sitting Rotation Stretch Repetitions 2 sets of 4 Days per week Daily  Main muscles worked: Piriformis, external oblique rotators, internal oblique rotators You should feel this stretch in your buttocks, as well as at your sides  Equipment needed: None  Step-by-step directions  Sit on the floor with both legs straight out in front of you. Cross one leg over the other. Slowly twist toward your bent leg, putting your hand behind you for support. Place your opposite arm on the side of your bent thigh and use it to help you twist further. Look over your shoulder and hold the stretch for 30 seconds. Slowly come back to center. Repeat on the other side. Repeat the entire sequence 4 times. Tip Sit up tall and keep your sit bones pressed into the floor throughout the stretch.  Sitting rotation stretch Sitting rotation stretch  4. Modified Seat Side Straddle Repetitions 10 each side Days per week Daily  Main muscles worked: Hamstrings, extensor muscles, erector spinae You should feel this stretch in the back of your thighs and into your lower and middle back  Equipment needed: None  Step-by-step directions  Sit on the floor with one leg extended to the side and the other leg bent. Keep your back straight and bend from your hips toward the foot of your straight leg. Reach your hands toward your toes and hold for 5 seconds. Slowly round your spine and bring your hands to your shin or ankle. Bring your head down as close to your knee as possible. Hold for 30 seconds and then relax for 30 seconds. Repeat on the other side. Repeat the sequence 10 times. Tip Keep your extended leg straight as you bring  your head down.  Modified seat side straddle Modified seat side straddle  5. Knee to Chest Repetitions 3 sets of 10 Days per week Daily  Main muscles worked: Quadratus lumborum You should feel this stretch in your lower back, as well as in the front of your hip and inner thigh  Equipment needed: None  Step-by-step directions  Lie on your back on the floor. Lift one leg and bring your knee toward your chest. Grasp your knee or shin and pull your leg in as far as it will go. Tighten your abdominals and press your spine to the floor. Hold for 5 seconds. Repeat on the other side, then pull both legs in together. Repeat the sequence 10 times. Tip Keep your spine aligned to the floor throughout the sequence.  Knee to chest Knee to chest  6. Bird Dog Repetitions 5 Days per week Daily  Main muscles worked: Back extensors, erector spinae, gluteal muscles You should feel this exercise in your lower back and into your buttocks  Equipment needed: None  Step-by-step directions  Begin on your hands and knees with your shoulders positioned over your hands and your hips directly over your knees. Tighten your abdominal muscles and raise one arm straight out to shoulder-height and level with your body.  Hold until you feel balanced. Slowly lift and extend the opposite leg straight out from your hip. Tighten the muscles in your buttocks and thigh, and hold this position for 15 seconds. Slowly return to the start position and repeat with the opposite arm and leg. Tip Keep your stomach muscles tight and your back flat to stay balanced.  Bird Risk manager  7. Plank Repetitions 5 Days per week Daily  Main muscles worked: Back extensors, erector spinae, quadratus lumborum, abdominals You should feel this exercise in your middle to lower back, abdominals, and gluteal muscles  Equipment needed: None  Step-by-step directions  Lie on your stomach with your forearms on the floor and your  elbows directly below your shoulders. Tighten your abdominal muscles and lift your hips off of the floor. Squeeze your gluteal muscles and lift your knees off of the floor. Keep your body straight and hold for 30 seconds. If you cannot hold this position, bring your knees back to the floor and hold with just your hips lifted. Slowly return to the start position and rest 30 seconds. Repeat. Tip Do not let your pelvis sag toward the floor. Keep your stomach muscles tight.  Plank Plank  8. Modified Side Plank Repetitions 5 Days per week Daily  Main muscles worked: Quadratus lumborum, external oblique rotators, internal oblique rotators You should feel this exercise in your lower back, waist, and abdominals  Equipment needed: None  Step-by-step directions  Lie on your side on the floor with your bottom leg slightly bent and top leg straight. Your elbow should be directly under your shoulder with your forearm extended on the floor in front of you. Tighten your abdominal muscles and raise your hip off of the floor. If you can, straighten your bottom leg and lift your knee off of the floor as shown. Keep your body straight and hold this position for 15 seconds. Slowly return to the start position and repeat on the other side. Tip Keep neck in alignment with your spine and do not shrug your shoulder up to your ear. Do not let your elbow fall behind your body; keep it directly under your shoulder.  Modified side plank Modified side plank  9. Hip Bridge Repetitions 5 Days per week Daily  Main muscles worked: Lower back extensor, erector spinae, gluteal muscles, hamstrings You should feel this exercise in your lower back, buttocks, and back of your thigh  Equipment needed: None  Step-by-step directions  Lie on your back on the floor with your arms at your sides, your knees bent, and your feet flat on the floor. Tighten your abdominal and gluteal muscles and lift your pelvis so that  your body is in a straight line from your shoulders to your knees. Hold this position for 15 seconds. Slowly return to the start position and repeat. Tip Center your weight over your shoulder blades. Do not tense up in your neck.  Hip bridge Hip bridge  10. Abdominal Bracing Repetitions 5 Days per week Daily  Main muscles worked: Abdominals You should feel this exercise in your stomach muscles  Equipment needed: None  Step-by-step directions  Lie on your back on the floor with your knees bent and arms at your sides. Tighten your abdominal muscles so that your stomach pulls away from your waistband (toward the floor). Hold this position for 15 seconds. Repeat. Tip Flatten your lower back into the floor.  Hip bridge Abdominal bracing  11. Abdominal Crunch Repetitions 2 sets of 10 Days  per week Daily  Main muscles worked: Abdominals You should feel this exercise in your stomach muscles  Equipment needed: None  Step-by-step directions  Lie on your back on the floor with your knees bent and hands at the back of your head with your elbows open wide. Tighten your abdominal muscles and lift your head and shoulder blades off of the floor. Keep your back flat to the floor and hold for 2 seconds. Slowly lower and repeat. Tip Relax your neck and do not pull on your head with your hands.

## 2022-05-28 ENCOUNTER — Ambulatory Visit: Payer: Medicaid Other | Attending: Maternal & Fetal Medicine

## 2022-05-28 ENCOUNTER — Ambulatory Visit: Payer: Medicaid Other | Admitting: *Deleted

## 2022-05-28 VITALS — BP 102/56 | HR 76

## 2022-05-28 DIAGNOSIS — O09522 Supervision of elderly multigravida, second trimester: Secondary | ICD-10-CM | POA: Diagnosis not present

## 2022-05-28 DIAGNOSIS — O0990 Supervision of high risk pregnancy, unspecified, unspecified trimester: Secondary | ICD-10-CM | POA: Diagnosis present

## 2022-05-28 DIAGNOSIS — O99012 Anemia complicating pregnancy, second trimester: Secondary | ICD-10-CM | POA: Diagnosis not present

## 2022-05-28 DIAGNOSIS — O24012 Pre-existing diabetes mellitus, type 1, in pregnancy, second trimester: Secondary | ICD-10-CM | POA: Diagnosis present

## 2022-05-28 DIAGNOSIS — O09529 Supervision of elderly multigravida, unspecified trimester: Secondary | ICD-10-CM | POA: Insufficient documentation

## 2022-05-28 DIAGNOSIS — D649 Anemia, unspecified: Secondary | ICD-10-CM

## 2022-05-28 DIAGNOSIS — Z3A26 26 weeks gestation of pregnancy: Secondary | ICD-10-CM

## 2022-05-28 DIAGNOSIS — E109 Type 1 diabetes mellitus without complications: Secondary | ICD-10-CM | POA: Diagnosis not present

## 2022-05-28 DIAGNOSIS — O24019 Pre-existing diabetes mellitus, type 1, in pregnancy, unspecified trimester: Secondary | ICD-10-CM | POA: Insufficient documentation

## 2022-05-28 DIAGNOSIS — O34219 Maternal care for unspecified type scar from previous cesarean delivery: Secondary | ICD-10-CM

## 2022-05-29 LAB — CBC
Hematocrit: 26.1 % — ABNORMAL LOW (ref 34.0–46.6)
Hemoglobin: 7.6 g/dL — ABNORMAL LOW (ref 11.1–15.9)
MCH: 21.2 pg — ABNORMAL LOW (ref 26.6–33.0)
MCHC: 29.1 g/dL — ABNORMAL LOW (ref 31.5–35.7)
MCV: 73 fL — ABNORMAL LOW (ref 79–97)
Platelets: 200 10*3/uL (ref 150–450)
RBC: 3.58 x10E6/uL — ABNORMAL LOW (ref 3.77–5.28)
RDW: 14.9 % (ref 11.7–15.4)
WBC: 5.5 10*3/uL (ref 3.4–10.8)

## 2022-05-29 LAB — HIV ANTIBODY (ROUTINE TESTING W REFLEX): HIV Screen 4th Generation wRfx: NONREACTIVE

## 2022-05-29 LAB — RPR: RPR Ser Ql: NONREACTIVE

## 2022-06-09 ENCOUNTER — Encounter: Payer: Self-pay | Admitting: Obstetrics and Gynecology

## 2022-06-09 ENCOUNTER — Other Ambulatory Visit: Payer: Self-pay | Admitting: Obstetrics and Gynecology

## 2022-06-09 ENCOUNTER — Other Ambulatory Visit: Payer: Self-pay

## 2022-06-09 ENCOUNTER — Ambulatory Visit (INDEPENDENT_AMBULATORY_CARE_PROVIDER_SITE_OTHER): Payer: Medicaid Other | Admitting: Obstetrics and Gynecology

## 2022-06-09 ENCOUNTER — Other Ambulatory Visit (HOSPITAL_COMMUNITY)
Admission: RE | Admit: 2022-06-09 | Discharge: 2022-06-09 | Disposition: A | Discharge status: Home / Self Care | Payer: Medicaid Other | Source: Ambulatory Visit | Attending: Obstetrics and Gynecology | Admitting: Obstetrics and Gynecology

## 2022-06-09 VITALS — BP 109/73 | HR 96 | Wt 164.8 lb

## 2022-06-09 DIAGNOSIS — O0990 Supervision of high risk pregnancy, unspecified, unspecified trimester: Secondary | ICD-10-CM

## 2022-06-09 DIAGNOSIS — O09522 Supervision of elderly multigravida, second trimester: Secondary | ICD-10-CM

## 2022-06-09 DIAGNOSIS — N90810 Female genital mutilation status, unspecified: Secondary | ICD-10-CM

## 2022-06-09 DIAGNOSIS — O24012 Pre-existing diabetes mellitus, type 1, in pregnancy, second trimester: Secondary | ICD-10-CM

## 2022-06-09 DIAGNOSIS — N898 Other specified noninflammatory disorders of vagina: Secondary | ICD-10-CM

## 2022-06-09 DIAGNOSIS — D508 Other iron deficiency anemias: Secondary | ICD-10-CM

## 2022-06-09 DIAGNOSIS — Z98891 History of uterine scar from previous surgery: Secondary | ICD-10-CM

## 2022-06-09 DIAGNOSIS — O99013 Anemia complicating pregnancy, third trimester: Secondary | ICD-10-CM

## 2022-06-09 DIAGNOSIS — Z3A28 28 weeks gestation of pregnancy: Secondary | ICD-10-CM

## 2022-06-09 DIAGNOSIS — O99019 Anemia complicating pregnancy, unspecified trimester: Secondary | ICD-10-CM | POA: Insufficient documentation

## 2022-06-09 MED ORDER — CLOTRIMAZOLE 1 % VA CREA
1.0000 | TOPICAL_CREAM | Freq: Every day | VAGINAL | 2 refills | Status: DC
Start: 2022-06-09 — End: 2022-08-07

## 2022-06-09 NOTE — Progress Notes (Signed)
IV iron infusion ordered for anemia in pregnancy.

## 2022-06-09 NOTE — Progress Notes (Addendum)
PRENATAL VISIT NOTE  Subjective:  Theresa Koch is a 42 y.o. G4P3003 at [redacted]w[redacted]d being seen today for ongoing prenatal care.  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  Patient reports  blisters and itching in her groin area. She was having some bumps due to hair growth and thought a wax would help, having itching and blisters since the wax  States diabetes was not well controlled a couple of week ago, states she was given the wrong sensor for her Josephine Igo which did not alarm when she was hypoglycemic and she was trying to crrect herself, so got CBGs up to 250. Reports her CBGs are much better.   Fasting 160 today, yesterday 202. She is continuing to improve her control. She is adjusting her regimen, basilar 10 units am then 3 or 4 at night. For humalog, 4-5 in morning, up to 6-7 at lunch and 4-5 at night. She is adjusting her regimen as needed    Contractions: Not present. Vag. Bleeding: None.  Movement: Present. Denies leaking of fluid.   Also concerned about urinary incontinence after delivery because she had urinary incontinence after her last delivery. Also requesting repair of circumcision area (which was torn during first delivery) after this delivery.  The following portions of the patient's history were reviewed and updated as appropriate: allergies, current medications, past family history, past medical history, past social history, past surgical history and problem list.   Objective:   Vitals:   06/09/22 0907  BP: 109/73  Pulse: 96  Weight: 74.8 kg    Fetal Status: Fetal Heart Rate (bpm): 154   Movement: Present     General:   Alert, oriented and cooperative. Patient is in no acute distress.  Skin: Skin is warm and dry. No rash noted.   Cardiovascular: Normal heart rate noted  Respiratory: Normal respiratory effort, no problems with respiration noted  Abdomen: Soft, gravid, appropriate for gestational age.  Pain/Pressure: Present     Pelvic: Ingrown hairs noted at groin folds, significant yeast seen on external labia minora         Extremities: Normal range of motion.  Edema: Trace  Mental Status: Normal mood and affect. Normal behavior. Normal judgment and thought content.   Assessment and Plan:  Pregnancy: G4P3003 at [redacted]w[redacted]d  1. Supervision of high risk pregnancy, antepartum Declines Tdap, will consider next month  2. Vaginal itching Yeast suspected, self swab done Rx sent for yeast - groin bumps are ingrown hairs, no concern for infection/sti  3. 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 Type 1 diabetes mellitus during pregnancy in second trimester Not taking ASA Has Freestyle libre on insulin Has had high numbers the last couple of weeks but reports they are improving and she is adjusting as needed  5. [redacted] weeks gestation of pregnancy  16. History of 2 cesarean sections Reviewed risks/benefits of TOLAC versus RCS in detail. Patient counseled regarding potential vaginal delivery, chance of success, future  implications, possible uterine rupture and need for urgent/emergent repeat cesarean. Counseled regarding potential need for repeat c-section for reasons unrelated to first c-section. Counseled regarding scheduled repeat cesarean including risks of bleeding, infection, damage to surrounding tissue, abnormal  placentation, implications for future pregnancies. All questions answered.  Patient desires TOLAC, consent signed 06/09/2022.  7. AMA (advanced maternal age) multigravida 71+, second trimester  8. Iron deficiency anemia secondary to inadequate dietary iron intake IV iron recommended and accepted Infusion ordered, infusion center will call to schedule   Preterm labor symptoms and general obstetric precautions including but not limited to vaginal bleeding, contractions, leaking of fluid and fetal movement were reviewed in detail with the patient. Please refer to After Visit Summary for other counseling recommendations.   Return in about 2 weeks (around 06/23/2022) for high OB.  Future Appointments  Date Time Provider Department Center  06/23/2022  8:30 AM [facility] NURSE [facility]  06/23/2022  8:45 AM [facility] US4 [facility]  06/23/2022 10:15 AM Reva Bores, MD [facility]  06/30/2022 11:00 AM Carlus Pavlov, MD [facility] None  07/07/2022 10:30 AM [facility] NURSE [facility]  07/07/2022 10:45 AM [facility] NST [facility]  07/14/2022  9:30 AM [facility] NURSE [facility]  07/14/2022  9:45 AM [facility] NST [facility]  07/21/2022  8:30 AM [facility] NURSE [facility]  07/21/2022  8:45 AM [facility] US4 [facility]  07/21/2022  9:45 AM [facility] NST [facility]    Tresa Endo Burnell Blanks, MD

## 2022-06-10 ENCOUNTER — Other Ambulatory Visit: Payer: Self-pay | Admitting: Family Medicine

## 2022-06-10 LAB — CERVICOVAGINAL ANCILLARY ONLY
Bacterial Vaginitis (gardnerella): NEGATIVE
Candida Glabrata: NEGATIVE
Candida Vaginitis: POSITIVE — AB
Comment: NEGATIVE
Comment: NEGATIVE
Comment: NEGATIVE

## 2022-06-10 MED ORDER — TERCONAZOLE 0.8 % VA CREA
1.0000 | TOPICAL_CREAM | Freq: Every day | VAGINAL | 0 refills | Status: DC
Start: 2022-06-10 — End: 2022-08-07

## 2022-06-12 ENCOUNTER — Telehealth: Payer: Self-pay | Admitting: Pharmacy Technician

## 2022-06-12 NOTE — Telephone Encounter (Signed)
Auth Submission: NO AUTH NEEDED Site of care: Site of care: [facility] Payer: [employer] Medication & CPT/J Code(s) submitted: Venofer (Iron Sucrose) J1756 Route of submission (phone, fax, portal):  Phone # Fax # Auth type: Buy/Bill Units/visits requested: X2 Reference number:  Approval from: 06/12/22 to 10/12/22

## 2022-06-20 ENCOUNTER — Ambulatory Visit (INDEPENDENT_AMBULATORY_CARE_PROVIDER_SITE_OTHER): Payer: Medicaid Other | Admitting: *Deleted

## 2022-06-20 VITALS — BP 101/65 | HR 72 | Temp 98.0°F | Resp 16 | Ht 65.0 in | Wt 168.2 lb

## 2022-06-20 DIAGNOSIS — Z3A29 29 weeks gestation of pregnancy: Secondary | ICD-10-CM | POA: Diagnosis not present

## 2022-06-20 DIAGNOSIS — O99013 Anemia complicating pregnancy, third trimester: Secondary | ICD-10-CM

## 2022-06-20 MED ORDER — IRON SUCROSE 500 MG IVPB - SIMPLE MED
500.0000 mg | Freq: Once | INTRAVENOUS | Status: AC
  Administered 2022-06-20: 500 mg via INTRAVENOUS
  Filled 2022-06-20: qty 500

## 2022-06-20 MED ORDER — DIPHENHYDRAMINE HCL 25 MG PO CAPS
25.0000 mg | ORAL_CAPSULE | Freq: Once | ORAL | Status: AC
  Administered 2022-06-20: 25 mg via ORAL
  Filled 2022-06-20: qty 1

## 2022-06-20 MED ORDER — ACETAMINOPHEN 325 MG PO TABS
650.0000 mg | ORAL_TABLET | Freq: Once | ORAL | Status: AC
  Administered 2022-06-20: 650 mg via ORAL
  Filled 2022-06-20: qty 2

## 2022-06-20 NOTE — Progress Notes (Signed)
Diagnosis: Anemia in Chronic Kidney Disease  Provider:  Chilton Greathouse MD  Procedure: IV Infusion  IV Type: Peripheral, IV Location: R Antecubital  Venofer (Iron Sucrose), Dose: 500 mg  Infusion Start Time: 0928 am  Infusion Stop Time: 1352 pm  Post Infusion IV Care: Observation period completed and Peripheral IV Discontinued  Discharge: Condition: Good, Destination: Home . AVS Provided  Performed by:  Forrest Moron, RN

## 2022-06-20 NOTE — Patient Instructions (Signed)

## 2022-06-23 ENCOUNTER — Encounter: Payer: Medicaid Other | Admitting: Family Medicine

## 2022-06-23 ENCOUNTER — Ambulatory Visit: Payer: Medicaid Other

## 2022-06-23 ENCOUNTER — Ambulatory Visit (INDEPENDENT_AMBULATORY_CARE_PROVIDER_SITE_OTHER): Payer: Medicaid Other | Admitting: Family Medicine

## 2022-06-23 ENCOUNTER — Encounter: Payer: Self-pay | Admitting: Family Medicine

## 2022-06-23 VITALS — BP 103/72 | HR 80 | Wt 167.0 lb

## 2022-06-23 DIAGNOSIS — Z98891 History of uterine scar from previous surgery: Secondary | ICD-10-CM

## 2022-06-23 DIAGNOSIS — O99013 Anemia complicating pregnancy, third trimester: Secondary | ICD-10-CM

## 2022-06-23 DIAGNOSIS — O24013 Pre-existing diabetes mellitus, type 1, in pregnancy, third trimester: Secondary | ICD-10-CM

## 2022-06-23 DIAGNOSIS — N90810 Female genital mutilation status, unspecified: Secondary | ICD-10-CM

## 2022-06-23 DIAGNOSIS — O0990 Supervision of high risk pregnancy, unspecified, unspecified trimester: Secondary | ICD-10-CM

## 2022-06-23 DIAGNOSIS — O09522 Supervision of elderly multigravida, second trimester: Secondary | ICD-10-CM

## 2022-06-23 DIAGNOSIS — Z3A30 30 weeks gestation of pregnancy: Secondary | ICD-10-CM

## 2022-06-23 NOTE — Progress Notes (Signed)
PRENATAL VISIT NOTE  Subjective:  Theresa Koch is a 42 y.o. G4P3003 at [redacted]w[redacted]d being seen today for ongoing prenatal care.  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  Patient reports just feeling bad. She is tearful today. Reports fatigue and high sugars.  Contractions: Not present. Vag. Bleeding: None.  Movement: Present. Denies leaking of fluid.   The following portions of the patient's history were reviewed and updated as appropriate: allergies, current medications, past family history, past medical history, past social history, past surgical history and problem list.   Objective:   Vitals:   06/23/22 1125  BP: 103/72  Pulse: 80  Weight: 167 lb (75.8 kg)    Fetal Status: Fetal Heart Rate (bpm): 153 Fundal Height: 31 cm Movement: Present     General:  Alert, oriented and cooperative. Patient is in no acute distress.  Skin: Skin is warm and dry. No rash noted.   Cardiovascular: Normal heart rate noted  Respiratory: Normal respiratory effort, no problems with respiration noted  Abdomen: Soft, gravid, appropriate for gestational age.  Pain/Pressure: Absent     Pelvic: Cervical exam deferred        Extremities: Normal range of motion.  Edema: Trace  Mental Status: Normal mood and affect. Normal behavior. Normal judgment and thought content.   Assessment and Plan:  Pregnancy: G4P3003 at [redacted]w[redacted]d 1. Supervision of high risk pregnancy, antepartum Up to date FH appropriate  2. AMA (advanced maternal age) multigravida 61+, second trimester LR NIP  3. History of 2 cesarean sections CS (elective in [place])--> VBAC--> CS for breech  (2018) Strongly desire TOLAC, has her 3 children and mother to take care off an worried about CS recovery Discussed risk of uterine rupture, recommend for early epidural and use of pitocin for contractions if needed  4. Type 1 diabetes mellitus during pregnancy in third trimester Blood sugars are poorly controlled Fastings always > 150, usually 280s PP are often > 200 She reports is never in range.  Currently on glargine 12u AM and 4u PM Takes humalog correction-- typically 5-10 units  She appears to be chasing her blood sugars with fast acting and needs better basal insulin control - Encouraged her to increase basal by 1-2 units if fasting is > 150. I recommended starting Glargine 14u tomorrow and using 6 units tonight.  - Continue humalog and discussed carb correction - Has appt next week with endocrine  We also discussed timing of delivery-- reviewed that if DM is uncontrolled we recommend 37-38 wks and if controlled 39 weeks.   5. 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 Anemia affecting pregnancy in third trimester Lab Results  Component Value Date   HGB 7.6 (L) 05/26/2022   HGB 9.4 (L) 02/24/2022   HGB 8.6 (L) 05/10/2016  - Venofer 500mg  on 6/14 and has another scheduled for 6/28   Preterm labor symptoms and general obstetric precautions including but not limited to vaginal bleeding, contractions, leaking of fluid and fetal movement were reviewed in detail with the patient. Please refer to After Visit Summary for other counseling recommendations.   Return in about 2 weeks (around 07/07/2022) for Routine prenatal care, MD only.  Future Appointments  Date Time Provider Department Center  06/30/2022 11:00 AM Carlus Pavlov, MD [facility] None  07/01/2022 10:30 AM [facility]URSE [facility]  07/01/2022 10:45 AM [facility] US6 [facility]  07/04/2022  8:45 AM [facility] 7 [facility] None  07/07/2022 10:30 AM [facility] NURSE [facility]  07/07/2022 10:45 AM [facility] NST [facility]   07/14/2022  8:15 AM Nelson Bing, MD [facility]  07/14/2022  9:30 AM [facility] NURSE [facility]  07/14/2022  9:45 AM [facility] NST [facility]  07/21/2022  8:30 AM [facility] NURSE [facility]  07/21/2022  8:45 AM [facility] US4 [facility]  07/21/2022  9:45 AM [facility] NST [facility]    [place] Althia Forts, MD

## 2022-06-27 ENCOUNTER — Other Ambulatory Visit: Payer: Self-pay | Admitting: Internal Medicine

## 2022-06-27 DIAGNOSIS — E1065 Type 1 diabetes mellitus with hyperglycemia: Secondary | ICD-10-CM

## 2022-06-30 ENCOUNTER — Encounter: Payer: Self-pay | Admitting: Internal Medicine

## 2022-06-30 ENCOUNTER — Ambulatory Visit (INDEPENDENT_AMBULATORY_CARE_PROVIDER_SITE_OTHER): Payer: Medicaid Other | Admitting: Internal Medicine

## 2022-06-30 VITALS — BP 116/68 | HR 84 | Ht 65.0 in | Wt 167.6 lb

## 2022-06-30 DIAGNOSIS — E1065 Type 1 diabetes mellitus with hyperglycemia: Secondary | ICD-10-CM

## 2022-06-30 DIAGNOSIS — E119 Type 2 diabetes mellitus without complications: Secondary | ICD-10-CM

## 2022-06-30 DIAGNOSIS — Z3A31 31 weeks gestation of pregnancy: Secondary | ICD-10-CM

## 2022-06-30 DIAGNOSIS — E7800 Pure hypercholesterolemia, unspecified: Secondary | ICD-10-CM

## 2022-06-30 DIAGNOSIS — O24013 Pre-existing diabetes mellitus, type 1, in pregnancy, third trimester: Secondary | ICD-10-CM

## 2022-06-30 LAB — POCT GLYCOSYLATED HEMOGLOBIN (HGB A1C): Hemoglobin A1C: 6.2 % — AB (ref 4.0–5.6)

## 2022-06-30 MED ORDER — FREESTYLE LIBRE 2 SENSOR MISC: 3 refills | Status: DC

## 2022-06-30 NOTE — Patient Instructions (Addendum)
Please use the following regimen: - Basaglar 16 units in am (may need to decrease to 14 units if sugars drop to 50s again) - Humalog:  6-8 units 15-30 before tea in am  8-10 (12 - at work) units before lunch 4 units before dinner  At night, do not use more than 1-2 units NovoLog for correction. When you correct a low, do not use more then 15-30 g carbs.   Please return in 1.5 months.

## 2022-06-30 NOTE — Progress Notes (Signed)
Patient ID: Theresa Koch, female   DOB: [birth year], 42 y.o.   MRN: [id number]  HPI: Theresa Koch is a 42 y.o.-year-old female, initially referred by Dr. Thornton Papas, returning for f/u for DM1, dx in 2011, but at DM1 in 02/2015, insulin-dependent since 2013, uncontrolled, without long term complications. Last visit 1.5 months ago.   Interim history: Patient is currently pregnant, [redacted] weeks along. EDD: 08/30/22 (will have a girl).  She has 3 other girls. No increased urination, blurry vision, still has nausea/vomiting - every morning.  She does have fatigue. She has anemia >> is on iron infusion.  Reviewed HbA1c levels: Lab Results  Component Value Date   HGBA1C 7.3 (H) 02/24/2022   HGBA1C 7.1 (A) 11/19/2021   HGBA1C 7.8 (A) 07/24/2021   HGBA1C 8.2 (A) 03/15/2021   HGBA1C 7.7 (A) 11/02/2020   HGBA1C 7.9 (A) 05/31/2020   HGBA1C 8.1 (A) 11/25/2019   HGBA1C 8.0 (A) 01/11/2019   HGBA1C 8.8 (A) 03/22/2018   HGBA1C 7.9 (A) 09/04/2017   HGBA1C 7.8 04/10/2017   HGBA1C 6.0 03/10/2016   HGBA1C 5.6 12/14/2015   HGBA1C 7.2 09/14/2015   HGBA1C 7.1 05/23/2015   HGBA1C 8.1 (H) 02/09/2015   HGBA1C 6.5 08/29/2014   HGBA1C 6.2 05/12/2014   HGBA1C 7.6 (H) 02/14/2014   HGBA1C 8.8 12/19/2013   She is on:  - Basaglar 7 units in am and 3-4 units at bedtime >> 16 units in am and 0-4 units at night - Humalog:  4-5 >> 6-8 units 15-30 before tea in am  6-7 >> 8-10  units before lunch 2-3 >> 4 units before dinner  At night, do not use more than 1-2 units NovoLog for correction. When you correct a low, do not use more then 15-30 g carbs.  We stopped Metformin XR 500 mg 3x a day with meals. We stopped Glipizide XL 5 mg in am.  Pt.checks her sugar is more than 4 times a day with her libre CGM:  Previously:   Previously:  Blood for the last 3 days:  Previously:   Lowest sugar was 40 >> ... 50s >> 40s at night >> 50s >> 27s ; she has hypoglycemia awareness in the  59s. Highest sugar was 300s >> 270s >> 200s >> 300s.  Glucometer: One Touch Ultra  Pt's meals are: - Breakfast: tea with milk + cookies >> tea - Lunch: salad, soup, cheese  - Dinner: egg, cheese, meat, sometimes cereals  - after dinner: milk >> stopped   No CKD; latest BUN/creatinine: Lab Results  Component Value Date   BUN 18 11/19/2021   Lab Results  Component Value Date   CREATININE 0.61 11/19/2021   No MAU: Lab Results  Component Value Date   MICRALBCREAT 1.0 11/19/2021   MICRALBCREAT 1.6 11/02/2020   MICRALBCREAT 1.1 11/25/2019   MICRALBCREAT 1.1 01/11/2019   MICRALBCREAT 0.9 04/10/2017   MICRALBCREAT 0.7 05/23/2015  She is not on ACE inhibitor/ARB.  + Mild HL: Lab Results  Component Value Date   CHOL 146 11/19/2021   HDL 50.10 11/19/2021   LDLCALC 84 11/19/2021   TRIG 58.0 11/19/2021   CHOLHDL 3 11/19/2021  She is not on a statin.  - last eye exam was in 01/2021: No DR reportedly.   - no numbness and tingling in her feet.  Foot exam performed 03/26/2022.  Latest TSH was reviewed and this was normal: Lab Results  Component Value Date   TSH 1.73 11/19/2021   ROS: +  see HPI  I reviewed pt's medications, allergies, PMH, social hx, family hx, and changes were documented in the history of present illness. Otherwise, unchanged from my initial visit note.  Past Medical History:  Diagnosis Date   Diabetes mellitus without complication (HCC)    Previous cesarean section 10/30/2014   TOLAC successful 2016    Seasonal allergies    Type 1 diabetes mellitus with hyperglycemia, with long-term current use of insulin (HCC) 05/23/2015   Past Surgical History:  Procedure Laterality Date   CESAREAN SECTION     CESAREAN SECTION N/A 05/09/2016   Procedure: CESAREAN SECTION;  Surgeon: Carrington Clamp, MD;  Location: [facility] 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 DX CODE: O24.912 200 each 3   aspirin EC 81 MG tablet Take 1 tablet (81 mg total) by mouth daily. Take after 12 weeks for prevention of preeclampssia later in pregnancy (Patient not taking: Reported on 03/31/2022) 300 tablet 2   BD PEN NEEDLE NANO 2ND GEN 32G X 4 MM MISC USE AS DIRECTED FIVE TIMES DAILY 400 each 3   Blood Glucose Monitoring Suppl (ACCU-CHEK GUIDE) w/Device KIT 1 Device by Does not apply route as needed. 1 kit 2   clotrimazole (GYNE-LOTRIMIN) 1 % vaginal cream Place 1 Applicatorful vaginally at bedtime. (Patient not taking: Reported on 06/23/2022) 30 g 2   Continuous Blood Gluc Receiver (FREESTYLE LIBRE 2 READER) DEVI 1 EACH BY DOES NOT APPLY ROUTE ONCE FOR 1 DOSE. 1 each 0   Continuous Blood Gluc Sensor (FREESTYLE LIBRE 2 SENSOR) MISC CHANGE EVERY 14 DAYS 2 each 5   Glucagon 3 MG/DOSE POWD Place 3 mg into the nose once as needed for up to 1 dose. (Patient not taking: Reported on 06/09/2022) 1 each 11   glucose blood test strip Use as instructed 100 each 12   Insulin Glargine (BASAGLAR KWIKPEN) 100 UNIT/ML Inject 10-12 Units into the skin daily. 15 mL 11   insulin lispro (HUMALOG KWIKPEN) 100 UNIT/ML KwikPen INJECT UP TO 20 UNITS INTO THE SKIN DAILY AS ADVISED. 30 mL 3   ondansetron (ZOFRAN) 4 MG tablet Take 1 tablet (4 mg total) by mouth every 8 (eight) hours as needed for nausea or vomiting. (Patient not taking: Reported on 04/28/2022) 20 tablet 0   prenatal vitamin w/FE, FA (PRENATAL 1 + 1) 27-1 MG TABS tablet Take 1 tablet by mouth daily at 12 noon. 30 tablet 12   promethazine (PHENERGAN) 25  MG tablet Take 1 tablet (25 mg total) by mouth every 6 (six) hours as needed for nausea or vomiting. (Patient not taking: Reported on 04/28/2022) 30 tablet 1   terconazole (TERAZOL 3) 0.8 % vaginal cream Place 1 applicator vaginally at bedtime. (Patient not taking: Reported on 06/23/2022) 20 g 0   No current facility-administered medications on file prior to visit.   Allergies  Allergen Reactions   Penicillins Rash    Has  patient had a PCN reaction causing immediate rash, facial/tongue/throat swelling, SOB or lightheadedness with hypotension: Yes Has patient had a PCN reaction causing severe rash involving mucus membranes or skin necrosis: No Has patient had a PCN reaction that required hospitalization No Has patient had a PCN reaction occurring within the last 10 years: No If all of the above answers are "NO", then may proceed with Cephalosporin use. Childhood allergy     Family History  Problem Relation Age of Onset   Diabetes Father    Kidney disease Father    Diabetes Sister    Diabetes Brother    Breast cancer Neg Hx    PE: BP 116/68   Pulse 84   Ht 5\' 5"  (1.651 m)   Wt 167 lb 9.6 oz (76 kg)   LMP 11/15/2021 (Exact Date) Comment: Irreg due to IUD removal  SpO2 98%   BMI 27.89 kg/m  Wt Readings from Last 3 Encounters:  06/30/22 167 lb 9.6 oz (76 kg)  06/23/22 167 lb (75.8 kg)  06/20/22 168 lb 3.2 oz (76.3 kg)   Constitutional: Pregnant appearing, in NAD Eyes: EOMI, no exophthalmos ENT: no thyromegaly, no cervical lymphadenopathy Cardiovascular: RRR, No MRG Respiratory: CTA B Musculoskeletal: no deformities Skin: no rashes Neurological: no tremor with outstretched hands  ASSESSMENT: 1. DM1, uncontrolled, without long term complications, but with hypo-/hyperglycemia  We confirmed DM1: Component     Latest Ref Rng 02/09/2015  C-Peptide     0.80 - 3.90 ng/mL 0.24 (L)  Glucose, Fasting     65 - 99 mg/dL 64 (L)  Glutamic Acid Decarb Ab     <5 IU/mL 42 (H)   Pancreatic Islet Cell Antibody     < 5 JDF Units <5   2. HL  PLAN:  1. Patient with uncontrolled type 1 diabetes, on basal-bolus insulin regimen, with suboptimal control, now pregnant in the third trimester.  HbA1c at last visit was 7.3%, higher.  At last visit, sugars appear to be improved throughout the day, but she had more lows during the second half of the night with a nadir around 3 AM.  She was correcting the lows and sugars were increasing and spiking around 9 AM, in the 200s.  We discussed about trying not to overcorrect low blood sugars and I recommended to use only 15 g of carbs for correction.  I also advised her to reduce Humalog insulin with dinner in the evening Basaglar dose to avoid low blood sugars at night.  However, since then, her sugars have been higher.  She increased her insulin doses.  These were further increased by her OB/GYN provider recently. -At last visit and again today we discussed about targets for HbA1c during pregnancy: Ideally lower than 6.5%, also fasting glucose less than 95, postprandial glucose: 1h: <140, 2h: <120 CGM interpretation: -At today's visit, we reviewed her CGM downloads: It appears that 74% of values are in target range (goal >70%), while 19% are higher than 180 (goal <25%), and 7% are lower than 70 (goal <4%).  The calculated average blood sugar is 134.  The projected HbA1c for the next 3 months (GMI) is 6.5%. -Reviewing the CGM trends, sugars appear to be fluctuating mostly within the 70-180 range.  She does have low blood sugars overnight, to the 50s.  It appears that she has some lows every night.  It is possible that the higher blood sugars after she drinks her tea are actually a rebound from these levels.  We discussed  about the possibility of reducing the dose of Basaglar to reduce the incidence of lows overnight.  We also discussed that she may need a slightly higher dose of insulin before breakfast but we have to be careful since she may have  some lows after breakfast and only a small proportion of the blood sugars are higher than target after this meal.  The sugars are more consistently elevated after lunch especially if she is working that day and we discussed that she appears to need a slightly higher dose of insulin before this meal if she eats at work.  We did not change her insulin before dinner. - I suggested to:  Patient Instructions  Please use the following regimen: - Basaglar 16 units in am (may need to decrease to 14 units if sugars drop to 50s again) - Humalog:  6-8 units 15-30 before tea in am  8-10 (12 - at work) units before lunch 4 units before dinner  At night, do not use more than 1-2 units NovoLog for correction. When you correct a low, do not use more then 15-30 g carbs.   Please return in 1.5 months.  - we checked her HbA1c: 6.2% (lower) - advised to check sugars at different times of the day - 4x a day, rotating check times - advised for yearly eye exams >> she is not UTD but plans to schedule this - return to clinic in 1.5 months   2. HL -Reviewed latest lipid panel from 11/2021: Fractions at goal: Lab Results  Component Value Date   CHOL 146 11/19/2021   HDL 50.10 11/19/2021   LDLCALC 84 11/19/2021   TRIG 58.0 11/19/2021   CHOLHDL 3 11/19/2021  -She is not on a statin  Carlus Pavlov, MD PhD [facility]

## 2022-07-01 ENCOUNTER — Ambulatory Visit: Payer: Medicaid Other | Attending: Maternal & Fetal Medicine

## 2022-07-01 ENCOUNTER — Other Ambulatory Visit: Payer: Self-pay | Admitting: *Deleted

## 2022-07-01 ENCOUNTER — Ambulatory Visit: Payer: Medicaid Other | Admitting: *Deleted

## 2022-07-01 VITALS — BP 106/67 | HR 80

## 2022-07-01 DIAGNOSIS — O24013 Pre-existing diabetes mellitus, type 1, in pregnancy, third trimester: Secondary | ICD-10-CM

## 2022-07-01 DIAGNOSIS — O24019 Pre-existing diabetes mellitus, type 1, in pregnancy, unspecified trimester: Secondary | ICD-10-CM | POA: Insufficient documentation

## 2022-07-01 DIAGNOSIS — O09529 Supervision of elderly multigravida, unspecified trimester: Secondary | ICD-10-CM | POA: Insufficient documentation

## 2022-07-01 DIAGNOSIS — D649 Anemia, unspecified: Secondary | ICD-10-CM | POA: Diagnosis not present

## 2022-07-01 DIAGNOSIS — O34219 Maternal care for unspecified type scar from previous cesarean delivery: Secondary | ICD-10-CM

## 2022-07-01 DIAGNOSIS — O0990 Supervision of high risk pregnancy, unspecified, unspecified trimester: Secondary | ICD-10-CM | POA: Insufficient documentation

## 2022-07-01 DIAGNOSIS — O99013 Anemia complicating pregnancy, third trimester: Secondary | ICD-10-CM | POA: Diagnosis not present

## 2022-07-01 DIAGNOSIS — O09523 Supervision of elderly multigravida, third trimester: Secondary | ICD-10-CM | POA: Diagnosis not present

## 2022-07-01 DIAGNOSIS — Z3A31 31 weeks gestation of pregnancy: Secondary | ICD-10-CM

## 2022-07-04 ENCOUNTER — Ambulatory Visit (INDEPENDENT_AMBULATORY_CARE_PROVIDER_SITE_OTHER): Payer: Medicaid Other

## 2022-07-04 VITALS — BP 100/63 | HR 80 | Temp 98.1°F | Resp 18 | Ht 65.0 in | Wt 168.8 lb

## 2022-07-04 DIAGNOSIS — O99013 Anemia complicating pregnancy, third trimester: Secondary | ICD-10-CM

## 2022-07-04 DIAGNOSIS — Z3A31 31 weeks gestation of pregnancy: Secondary | ICD-10-CM | POA: Diagnosis not present

## 2022-07-04 MED ORDER — DIPHENHYDRAMINE HCL 25 MG PO CAPS
25.0000 mg | ORAL_CAPSULE | Freq: Once | ORAL | Status: AC
  Administered 2022-07-04: 25 mg via ORAL
  Filled 2022-07-04: qty 1

## 2022-07-04 MED ORDER — IRON SUCROSE 500 MG IVPB - SIMPLE MED
500.0000 mg | Freq: Once | INTRAVENOUS | Status: AC
  Administered 2022-07-04: 500 mg via INTRAVENOUS
  Filled 2022-07-04: qty 500

## 2022-07-04 MED ORDER — ACETAMINOPHEN 325 MG PO TABS
650.0000 mg | ORAL_TABLET | Freq: Once | ORAL | Status: AC
  Administered 2022-07-04: 650 mg via ORAL
  Filled 2022-07-04: qty 2

## 2022-07-04 NOTE — Progress Notes (Signed)
Diagnosis: Iron Deficiency Anemia  Provider:  Chilton Greathouse MD  Procedure: IV Infusion  IV Type: Peripheral, IV Location: L Antecubital  Venofer (Iron Sucrose), Dose- 500 mg  Infusion Start Time: 0936  Infusion Stop Time: 1428  Post Infusion IV Care: Patient declined observation and Peripheral IV Discontinued  Discharge: Condition: Good, Destination: Home . AVS Declined  Performed by:  Adriana Mccallum, RN

## 2022-07-07 ENCOUNTER — Ambulatory Visit: Payer: Medicaid Other | Attending: Maternal & Fetal Medicine

## 2022-07-07 ENCOUNTER — Ambulatory Visit: Payer: Medicaid Other

## 2022-07-14 ENCOUNTER — Ambulatory Visit: Payer: Medicaid Other | Attending: Maternal & Fetal Medicine | Admitting: *Deleted

## 2022-07-14 ENCOUNTER — Ambulatory Visit (INDEPENDENT_AMBULATORY_CARE_PROVIDER_SITE_OTHER): Payer: Medicaid Other | Admitting: Obstetrics and Gynecology

## 2022-07-14 ENCOUNTER — Ambulatory Visit: Payer: Medicaid Other | Admitting: *Deleted

## 2022-07-14 VITALS — BP 102/67 | HR 79 | Wt 170.5 lb

## 2022-07-14 VITALS — BP 109/71 | HR 82

## 2022-07-14 DIAGNOSIS — E109 Type 1 diabetes mellitus without complications: Secondary | ICD-10-CM | POA: Diagnosis not present

## 2022-07-14 DIAGNOSIS — O24013 Pre-existing diabetes mellitus, type 1, in pregnancy, third trimester: Secondary | ICD-10-CM | POA: Insufficient documentation

## 2022-07-14 DIAGNOSIS — Z3A33 33 weeks gestation of pregnancy: Secondary | ICD-10-CM | POA: Diagnosis not present

## 2022-07-14 DIAGNOSIS — O09523 Supervision of elderly multigravida, third trimester: Secondary | ICD-10-CM | POA: Diagnosis not present

## 2022-07-14 DIAGNOSIS — O09522 Supervision of elderly multigravida, second trimester: Secondary | ICD-10-CM

## 2022-07-14 DIAGNOSIS — O99013 Anemia complicating pregnancy, third trimester: Secondary | ICD-10-CM

## 2022-07-14 DIAGNOSIS — Z0289 Encounter for other administrative examinations: Secondary | ICD-10-CM

## 2022-07-14 DIAGNOSIS — O0990 Supervision of high risk pregnancy, unspecified, unspecified trimester: Secondary | ICD-10-CM

## 2022-07-14 DIAGNOSIS — Z98891 History of uterine scar from previous surgery: Secondary | ICD-10-CM

## 2022-07-14 NOTE — Progress Notes (Signed)
PRENATAL VISIT NOTE  Subjective:  Theresa Koch is a 42 y.o. G4P3003 at [redacted]w[redacted]d being seen today for ongoing prenatal care.  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  Patient reports no complaints.  Contractions: Irritability. Vag. Bleeding: None.  Movement: Present. Denies leaking of fluid.   The following portions of the patient's history were reviewed and updated as appropriate: allergies, current medications, past family history, past medical history, past social history, past surgical history and problem list.   Objective:   Vitals:   07/14/22 0851  BP: 102/67  Pulse: 79  Weight: 170 lb 8 oz (77.3 kg)    Fetal Status: Fetal Heart Rate (bpm): 148   Movement: Present     General:  Alert, oriented and cooperative. Patient is in no acute distress.  Skin: Skin is warm and dry. No rash noted.   Cardiovascular: Normal heart rate noted  Respiratory: Normal respiratory effort, no problems with respiration noted  Abdomen: Soft, gravid, appropriate for gestational age.  Pain/Pressure: Absent     Pelvic: Cervical exam deferred        Extremities: Normal range of motion.  Edema: Trace  Mental Status: Normal mood and affect. Normal behavior. Normal judgment and thought content.   Assessment and Plan:  Pregnancy: G4P3003 at [redacted]w[redacted]d 1. [redacted] weeks gestation of pregnancy  2. Type 1 diabetes mellitus during pregnancy in third trimester Co-managed with Dr. Elvera Lennox, last visit with her on 6/24. Patient currently on Glargine 16u every morning and usually gives her self 9-10 of lispro with meals. She reports AM fastings <100  but if she has does bedtime correction then she can have elevated AM fastings. Post prandials are near the 180s and someitimes low 200s. 6/25 u/s with efw 41%, 1785gm, ac 46%, afi 15.  I told her I recommend increasing her meal time insulin and leaving her Glargine at 16 units  I told her that based on her numbers that I recommend 37w delivery due to risk of fetal distress past that given suboptimally controlled CBGs and being AMA >40. She really doesn't want to do delivery around this time. I told her that we can talk to her more about this next visit and that she decline our 37wk delivery recommendation as long as she is aware of risk of continuing pregnancy past this  Qwk testing to start today  Fetal echo wnl 4/25 3. Supervision of high risk pregnancy, antepartum  4. History of 2 cesarean sections See below  5. Anemia affecting pregnancy in third trimester S/p IV iron courses  6. AMA (advanced maternal age) multigravida 42+, second trimester See above  7. History of VBAC Desires TOLAC. Consent signed 6/12  Preterm labor symptoms and general obstetric precautions including but not limited to vaginal bleeding, contractions, leaking of fluid and fetal movement were reviewed in detail with the patient. Please refer to After Visit Summary for other counseling recommendations.   Return for needs 7/15, 22, 29 hrob with md visit.  Future Appointments  Date Time Provider Department Center  07/21/2022  8:30 AM [facility] NURSE [facility]  07/21/2022  8:45 AM [facility] US4 [facility]  07/21/2022  9:45 AM [facility] NST [facility]  07/28/2022  8:30 AM [facility] NURSE [facility]  07/28/2022  8:45 AM [facility] US5 [facility]  07/29/2022  9:00 AM Carlus Pavlov, MD [facility] None  08/04/2022  8:45 AM [facility] NURSE [facility]  08/04/2022  9:00 AM [facility] US1 [facility]    [place], MD

## 2022-07-14 NOTE — Procedures (Signed)
Theresa Koch Jul 08, 1980 [redacted]w[redacted]d  Fetus A Non-Stress Test Interpretation for 07/14/22  Indication: Diabetes and Advanced Maternal Age >40 years  Fetal Heart Rate A Mode: External Baseline Rate (A): 150 bpm Variability: Moderate Accelerations: 15 x 15 Decelerations: None Multiple birth?: No  Uterine Activity Mode: Palpation, Toco Contraction Frequency (min): None  Interpretation (Fetal Testing) Nonstress Test Interpretation: Reactive Comments: Dr. Darra Lis reviewed tracing

## 2022-07-21 ENCOUNTER — Ambulatory Visit: Payer: Medicaid Other | Admitting: *Deleted

## 2022-07-21 ENCOUNTER — Ambulatory Visit: Payer: Medicaid Other | Attending: Maternal & Fetal Medicine

## 2022-07-21 VITALS — BP 109/68 | HR 82

## 2022-07-21 DIAGNOSIS — O0990 Supervision of high risk pregnancy, unspecified, unspecified trimester: Secondary | ICD-10-CM | POA: Diagnosis present

## 2022-07-21 DIAGNOSIS — O34219 Maternal care for unspecified type scar from previous cesarean delivery: Secondary | ICD-10-CM

## 2022-07-21 DIAGNOSIS — O09523 Supervision of elderly multigravida, third trimester: Secondary | ICD-10-CM | POA: Diagnosis not present

## 2022-07-21 DIAGNOSIS — O24013 Pre-existing diabetes mellitus, type 1, in pregnancy, third trimester: Secondary | ICD-10-CM

## 2022-07-21 DIAGNOSIS — O24019 Pre-existing diabetes mellitus, type 1, in pregnancy, unspecified trimester: Secondary | ICD-10-CM | POA: Insufficient documentation

## 2022-07-21 DIAGNOSIS — O99013 Anemia complicating pregnancy, third trimester: Secondary | ICD-10-CM | POA: Diagnosis not present

## 2022-07-21 DIAGNOSIS — D649 Anemia, unspecified: Secondary | ICD-10-CM | POA: Diagnosis not present

## 2022-07-21 DIAGNOSIS — Z3A34 34 weeks gestation of pregnancy: Secondary | ICD-10-CM

## 2022-07-21 DIAGNOSIS — O09529 Supervision of elderly multigravida, unspecified trimester: Secondary | ICD-10-CM | POA: Diagnosis present

## 2022-07-21 NOTE — Procedures (Signed)
Theresa Koch Chimenti [birth year] [redacted]w[redacted]d  Fetus A Non-Stress Test Interpretation for 07/21/22  Indication: Diabetes and Advanced Maternal Age >40 years  Fetal Heart Rate A Mode: External Baseline Rate (A): 145 bpm Variability: Moderate Accelerations: 15 x 15 Decelerations: None Multiple birth?: No  Uterine Activity Mode: Palpation, Toco Contraction Frequency (min): None Resting Tone Palpated: Relaxed Resting Time: Adequate  Interpretation (Fetal Testing) Nonstress Test Interpretation: Reactive Comments: Dr. Darra Lis reviewed tracing

## 2022-07-28 ENCOUNTER — Other Ambulatory Visit: Payer: Self-pay | Admitting: *Deleted

## 2022-07-28 ENCOUNTER — Ambulatory Visit: Payer: Medicaid Other | Attending: Obstetrics and Gynecology

## 2022-07-28 ENCOUNTER — Ambulatory Visit: Payer: Medicaid Other | Admitting: *Deleted

## 2022-07-28 VITALS — BP 105/60 | HR 71

## 2022-07-28 DIAGNOSIS — Z3A35 35 weeks gestation of pregnancy: Secondary | ICD-10-CM

## 2022-07-28 DIAGNOSIS — O24013 Pre-existing diabetes mellitus, type 1, in pregnancy, third trimester: Secondary | ICD-10-CM | POA: Diagnosis present

## 2022-07-28 DIAGNOSIS — O99013 Anemia complicating pregnancy, third trimester: Secondary | ICD-10-CM

## 2022-07-28 DIAGNOSIS — O34219 Maternal care for unspecified type scar from previous cesarean delivery: Secondary | ICD-10-CM

## 2022-07-28 DIAGNOSIS — O0990 Supervision of high risk pregnancy, unspecified, unspecified trimester: Secondary | ICD-10-CM | POA: Diagnosis present

## 2022-07-28 DIAGNOSIS — O09523 Supervision of elderly multigravida, third trimester: Secondary | ICD-10-CM

## 2022-07-28 DIAGNOSIS — Z98891 History of uterine scar from previous surgery: Secondary | ICD-10-CM

## 2022-07-28 DIAGNOSIS — D649 Anemia, unspecified: Secondary | ICD-10-CM | POA: Diagnosis not present

## 2022-07-29 ENCOUNTER — Ambulatory Visit (INDEPENDENT_AMBULATORY_CARE_PROVIDER_SITE_OTHER): Payer: Medicaid Other | Admitting: Internal Medicine

## 2022-07-29 ENCOUNTER — Encounter: Payer: Self-pay | Admitting: Internal Medicine

## 2022-07-29 DIAGNOSIS — E1065 Type 1 diabetes mellitus with hyperglycemia: Secondary | ICD-10-CM

## 2022-07-29 MED ORDER — BASAGLAR KWIKPEN 100 UNIT/ML ~~LOC~~ SOPN
10.0000 [IU] | PEN_INJECTOR | Freq: Every day | SUBCUTANEOUS | 3 refills | Status: DC
Start: 2022-07-29 — End: 2022-08-04

## 2022-07-29 NOTE — Progress Notes (Signed)
Patient ID: Theresa Koch, female   DOB: [birth year], 42 y.o.   MRN: [id number]  HPI: Theresa Koch is a 42 y.o.-year-old female, initially referred by Dr. Thornton Papas, returning for f/u for DM1, dx in 2011, but at DM1 in 02/2015, insulin-dependent since 2013, uncontrolled, without long term complications. Last visit 1 months ago.   Interim history: Patient is currently pregnant, [redacted] weeks along. EDD: 08/30/22 (will have a girl).  She has 3 other girls.  No increased urination, blurry vision, but has fatigue and still has nausea and vomiting in the morning. She has anemia >> is on iron infusion.  Latest hemoglobin was 7.6 (!) on 05/26/2022.  Reviewed HbA1c levels: 06/20/2022: HbA1c 6.2% Lab Results  Component Value Date   HGBA1C 7.3 (H) 02/24/2022   HGBA1C 7.1 (A) 11/19/2021   HGBA1C 7.8 (A) 07/24/2021   HGBA1C 8.2 (A) 03/15/2021   HGBA1C 7.7 (A) 11/02/2020   HGBA1C 7.9 (A) 05/31/2020   HGBA1C 8.1 (A) 11/25/2019   HGBA1C 8.0 (A) 01/11/2019   HGBA1C 8.8 (A) 03/22/2018   HGBA1C 7.9 (A) 09/04/2017   HGBA1C 7.8 04/10/2017   HGBA1C 6.0 03/10/2016   HGBA1C 5.6 12/14/2015   HGBA1C 7.2 09/14/2015   HGBA1C 7.1 05/23/2015   HGBA1C 8.1 (H) 02/09/2015   HGBA1C 6.5 08/29/2014   HGBA1C 6.2 05/12/2014   HGBA1C 7.6 (H) 02/14/2014   HGBA1C 8.8 12/19/2013   She is on:  - Basaglar 7 units in am and 3-4 units at bedtime >> 16 units in am and 0-4 units at night >> 16 units in am - Humalog:  4-5 >> 6-8 units 15-30 before tea in am  6-7 >> 8-10 (12) units before lunch 2-3 >> 4 units before dinner  At night, do not use more than 1-2 units NovoLog for correction. When you correct a low, do not use more then 15-30 g carbs.  We stopped Metformin XR 500 mg 3x a day with meals. We stopped Glipizide XL 5 mg in am.  Pt.checks her sugar is more than 4 times a day with her libre CGM:  Previously:  Previously:   Lowest sugar was 40s at night >> 50s >> 50s ; she has hypoglycemia  awareness in the 60s. Highest sugar was 200s >> 300s >> 200s.  Glucometer: One Touch Ultra  Pt's meals are: - Breakfast: tea with milk + cookies >> tea - Lunch: salad, soup, cheese  - Dinner: egg, cheese, meat, sometimes cereals  - after dinner: milk >> stopped   No CKD; latest BUN/creatinine: Lab Results  Component Value Date   BUN 18 11/19/2021   Lab Results  Component Value Date   CREATININE 0.61 11/19/2021   No MAU: Lab Results  Component Value Date   MICRALBCREAT 1.0 11/19/2021   MICRALBCREAT 1.6 11/02/2020   MICRALBCREAT 1.1 11/25/2019   MICRALBCREAT 1.1 01/11/2019   MICRALBCREAT 0.9 04/10/2017   MICRALBCREAT 0.7 05/23/2015  She is not on ACE inhibitor/ARB.  + Mild HL: Lab Results  Component Value Date   CHOL 146 11/19/2021   HDL 50.10 11/19/2021   LDLCALC 84 11/19/2021   TRIG 58.0 11/19/2021   CHOLHDL 3 11/19/2021  She is not on a statin.  - last eye exam was in 01/2021: No DR reportedly.   - no numbness and tingling in her feet.  Foot exam performed 03/26/2022.  Latest TSH was reviewed and this was normal: Lab Results  Component Value Date   TSH 1.73 11/19/2021   ROS: +  see HPI  I reviewed pt's medications, allergies, PMH, social hx, family hx, and changes were documented in the history of present illness. Otherwise, unchanged from my initial visit note.  Past Medical History:  Diagnosis Date   Diabetes mellitus without complication (HCC)    Previous cesarean section 10/30/2014   TOLAC successful 2016    Seasonal allergies    Type 1 diabetes mellitus with hyperglycemia, with long-term current use of insulin (HCC) 05/23/2015   Past Surgical History:  Procedure Laterality Date   CESAREAN SECTION     CESAREAN SECTION N/A 05/09/2016   Procedure: CESAREAN SECTION;  Surgeon: Carrington Clamp, MD;  Location: [facility];  Service: Obstetrics;  Laterality: N/A;   History   Social History   Marital Status: Married    Spouse Name: N/A     Number of Children: 1   Occupational History   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 DX CODE: O24.912 200 each 3   aspirin EC 81 MG tablet Take 1 tablet (81 mg total) by mouth daily. Take after 12 weeks for prevention of preeclampssia later in pregnancy (Patient not taking: Reported on 07/14/2022) 300 tablet 2   BD PEN NEEDLE NANO 2ND GEN 32G X 4 MM MISC USE AS DIRECTED FIVE TIMES DAILY 400 each 3   Blood Glucose Monitoring Suppl (ACCU-CHEK GUIDE) w/Device KIT 1 Device by Does not apply route as needed. 1 kit 2   clotrimazole (GYNE-LOTRIMIN) 1 % vaginal cream Place 1 Applicatorful vaginally at bedtime. (Patient not taking: Reported on 07/21/2022) 30 g 2   Continuous Blood Gluc Receiver (FREESTYLE LIBRE 2 READER) DEVI 1 EACH BY DOES NOT APPLY ROUTE ONCE FOR 1 DOSE. 1 each 0   Continuous Glucose Sensor (FREESTYLE LIBRE 2 SENSOR) MISC CHANGE EVERY 14 DAYS 6 each 3   Glucagon 3 MG/DOSE POWD Place 3 mg into the nose once as needed for up to 1 dose. 1 each 11   glucose blood test strip Use as instructed 100 each 12   Insulin Glargine (BASAGLAR KWIKPEN) 100 UNIT/ML Inject 10-12 Units into the skin daily. (Patient taking differently: Inject 10-12 Units into the skin daily. 16 units in the morning) 15 mL 11   insulin lispro (HUMALOG KWIKPEN) 100 UNIT/ML KwikPen INJECT UP TO 20 UNITS INTO THE SKIN DAILY AS ADVISED. (Patient taking differently: 8 units before breakfast, 9 units before lunch, 4-6 units before dinner) 30 mL 3   ondansetron (ZOFRAN) 4 MG tablet Take 1 tablet (4 mg total) by mouth every 8 (eight) hours as needed for nausea or vomiting. (Patient not taking: Reported on 06/30/2022) 20  tablet 0   prenatal vitamin w/FE, FA (PRENATAL 1 + 1) 27-1 MG TABS tablet Take 1 tablet by mouth daily at 12 noon. 30 tablet 12   promethazine (PHENERGAN) 25 MG tablet Take 1 tablet (25 mg total) by mouth every 6 (six) hours as needed for nausea or vomiting. (Patient not taking: Reported on 06/30/2022) 30 tablet 1   terconazole (TERAZOL 3) 0.8 % vaginal cream Place 1 applicator vaginally at bedtime. (Patient not taking: Reported on 06/30/2022) 20 g 0  No current facility-administered medications on file prior to visit.   Allergies  Allergen Reactions   Penicillins Rash    Has patient had a PCN reaction causing immediate rash, facial/tongue/throat swelling, SOB or lightheadedness with hypotension: Yes Has patient had a PCN reaction causing severe rash involving mucus membranes or skin necrosis: No Has patient had a PCN reaction that required hospitalization No Has patient had a PCN reaction occurring within the last 10 years: No If all of the above answers are "NO", then may proceed with Cephalosporin use. Childhood allergy     Family History  Problem Relation Age of Onset   Diabetes Father    Kidney disease Father    Diabetes Sister    Diabetes Brother    Breast cancer Neg Hx    PE: BP 100/66   Pulse 81   Ht 5\' 5"  (1.651 m)   Wt 170 lb 9.6 oz (77.4 kg)   LMP 11/15/2021 (Exact Date) Comment: Irreg due to IUD removal  SpO2 98%   BMI 28.39 kg/m  Wt Readings from Last 3 Encounters:  07/29/22 170 lb 9.6 oz (77.4 kg)  07/14/22 170 lb 8 oz (77.3 kg)  07/04/22 168 lb 12.8 oz (76.6 kg)   Constitutional: Pregnant appearing, in NAD Eyes: EOMI, no exophthalmos ENT: no thyromegaly, no cervical lymphadenopathy Cardiovascular: RRR, No MRG Respiratory: CTA B Musculoskeletal: no deformities Skin: no rashes Neurological: no tremor with outstretched hands  ASSESSMENT: 1. DM1, uncontrolled, without long term complications, but with hypo-/hyperglycemia  We confirmed DM1: Component      Latest Ref Rng 02/09/2015  C-Peptide     0.80 - 3.90 ng/mL 0.24 (L)  Glucose, Fasting     65 - 99 mg/dL 64 (L)  Glutamic Acid Decarb Ab     <5 IU/mL 42 (H)  Pancreatic Islet Cell Antibody     < 5 JDF Units <5   2. HL  PLAN:  1. Patient with uncontrolled type 1 diabetes, on basal/bolus insulin regimen, with suboptimal control, now pregnant, in the third trimester.  At last visit HbA1c was the best she had in a long time, at 6.2%.  At that time, sugars were fluctuating mostly within the 70-180 range but with occasional low blood sugars overnight, to the 50s.  She had lows almost every night.  It was possible that the high blood sugars in the morning, after her tea, were actually a rebound from these low levels.  We discussed about reducing the dose of Basaglar to reduce the incidence of lows overnight and that she may need a slightly higher dose of insulin before breakfast.  Sugars are more consistently elevated after lunch especially when she was working and I advised her to take a slightly higher dose of Humalog whenever she was having lunch at work. -At last visit and again today we discussed about targets for HbA1c during pregnancy: Ideally lower than 6.5%, also fasting glucose less than 95, postprandial glucose: 1h: <140, 2h: <120 CGM interpretation: -At today's visit, we reviewed her CGM downloads: It appears that 81% of values are in target range (goal >70%), while 10% are higher than 180 (goal <25%), and 9% are lower than 70 (goal <4%).  The calculated average blood sugar is 119.  The projected HbA1c for the next 3 months (GMI) is 6.2%. -Reviewing the CGM trends, sugars appear to have improved since last visit, now fluctuating within the target range.  She has slightly higher blood sugars after meals, but these are mostly at  goal, except for occasional higher blood sugars after breakfast.  Upon questioning, she is eating cakes and other sweets with her tea in the morning.  We discussed about  adding 2 units of Humalog depending on the amount of sweets that she is eating.  She is trying to cut down on these.  For now, we can continue the rest of the regimen.  However, as she will give first before returning to the clinic, we discussed about decreased insulin requirements right after giving birth, especially as she is also planning to breast-feed.  I gave her a new insulin doses for the postpartum period.  We discussed that she may need to increase these based on her blood sugars. - I suggested to:  Patient Instructions  Please use the following regimen: - Basaglar 16 units in am - Humalog:  6-8 (10) units 15-30 before tea in am  8-10 (12 - at work) units before lunch 4 units before dinner  At night, do not use more than 1-2 units NovoLog for correction. When you correct a low, do not use more then 15-30 g carbs.  After you give birth: - Basaglar 10-12 units daily - Humalog:  B and L: 5-6 units  D: 3-4 units   Please return in 2 months.  - advised to check sugars at different times of the day - 4x a day, rotating check times - advised for yearly eye exams >> she is not UTD but called to schedule an appointment -she is on the waiting list - return to clinic in 2 months   2. HL -Fractions were at goal on the latest lipid panel from 11/2021: Lab Results  Component Value Date   CHOL 146 11/19/2021   HDL 50.10 11/19/2021   LDLCALC 84 11/19/2021   TRIG 58.0 11/19/2021   CHOLHDL 3 11/19/2021  -She is not on a statin  Carlus Pavlov, MD PhD [facility] Endocrinology

## 2022-07-29 NOTE — Addendum Note (Signed)
Addended by: Pollie Meyer on: 07/29/2022 08:14 AM   Modules accepted: Orders

## 2022-07-29 NOTE — Patient Instructions (Addendum)
Please use the following regimen: - Basaglar 16 units in am - Humalog:  6-8 (10) units 15-30 before tea in am  8-10 (12 - at work) units before lunch 4 units before dinner  At night, do not use more than 1-2 units NovoLog for correction. When you correct a low, do not use more then 15-30 g carbs.  After you give birth: - Basaglar 10-12 units daily - Humalog:  B and L: 5-6 units  D: 3-4 units   Please return in 2 months.

## 2022-07-30 ENCOUNTER — Other Ambulatory Visit: Payer: Self-pay | Admitting: Maternal & Fetal Medicine

## 2022-07-30 DIAGNOSIS — O09529 Supervision of elderly multigravida, unspecified trimester: Secondary | ICD-10-CM

## 2022-07-30 DIAGNOSIS — O24019 Pre-existing diabetes mellitus, type 1, in pregnancy, unspecified trimester: Secondary | ICD-10-CM

## 2022-08-04 ENCOUNTER — Ambulatory Visit: Payer: Medicaid Other | Attending: Obstetrics and Gynecology

## 2022-08-04 ENCOUNTER — Ambulatory Visit: Payer: Medicaid Other

## 2022-08-04 ENCOUNTER — Encounter: Payer: Self-pay | Admitting: *Deleted

## 2022-08-04 ENCOUNTER — Other Ambulatory Visit: Payer: Self-pay | Admitting: Internal Medicine

## 2022-08-04 VITALS — BP 103/56 | HR 75

## 2022-08-04 DIAGNOSIS — D649 Anemia, unspecified: Secondary | ICD-10-CM

## 2022-08-04 DIAGNOSIS — O09523 Supervision of elderly multigravida, third trimester: Secondary | ICD-10-CM

## 2022-08-04 DIAGNOSIS — E1065 Type 1 diabetes mellitus with hyperglycemia: Secondary | ICD-10-CM

## 2022-08-04 DIAGNOSIS — O0990 Supervision of high risk pregnancy, unspecified, unspecified trimester: Secondary | ICD-10-CM | POA: Insufficient documentation

## 2022-08-04 DIAGNOSIS — O99013 Anemia complicating pregnancy, third trimester: Secondary | ICD-10-CM

## 2022-08-04 DIAGNOSIS — E1069 Type 1 diabetes mellitus with other specified complication: Secondary | ICD-10-CM

## 2022-08-04 DIAGNOSIS — O34219 Maternal care for unspecified type scar from previous cesarean delivery: Secondary | ICD-10-CM

## 2022-08-04 DIAGNOSIS — O24013 Pre-existing diabetes mellitus, type 1, in pregnancy, third trimester: Secondary | ICD-10-CM | POA: Diagnosis present

## 2022-08-04 DIAGNOSIS — Z3A36 36 weeks gestation of pregnancy: Secondary | ICD-10-CM

## 2022-08-05 ENCOUNTER — Other Ambulatory Visit: Payer: Self-pay | Admitting: Internal Medicine

## 2022-08-05 ENCOUNTER — Telehealth: Payer: Self-pay

## 2022-08-05 DIAGNOSIS — E1065 Type 1 diabetes mellitus with hyperglycemia: Secondary | ICD-10-CM

## 2022-08-05 MED ORDER — BASAGLAR KWIKPEN 100 UNIT/ML ~~LOC~~ SOPN
20.0000 [IU] | PEN_INJECTOR | Freq: Every day | SUBCUTANEOUS | 1 refills | Status: DC
Start: 2022-08-05 — End: 2022-08-08

## 2022-08-05 NOTE — Telephone Encounter (Signed)
Requested Prescriptions   Signed Prescriptions Disp Refills   Insulin Glargine (BASAGLAR KWIKPEN) 100 UNIT/ML 30 mL 1    Sig: Inject 20 Units into the skin daily.    Authorizing Provider: Carlus Pavlov    Ordering User: Pollie Meyer

## 2022-08-05 NOTE — Telephone Encounter (Signed)
Patient using 20 units of Basaglar and keeps running out of the medication. Need new script sent with more insulin.

## 2022-08-07 ENCOUNTER — Encounter: Payer: Self-pay | Admitting: *Deleted

## 2022-08-07 ENCOUNTER — Ambulatory Visit (INDEPENDENT_AMBULATORY_CARE_PROVIDER_SITE_OTHER): Payer: Medicaid Other | Admitting: Family Medicine

## 2022-08-07 ENCOUNTER — Other Ambulatory Visit (HOSPITAL_COMMUNITY)
Admission: RE | Admit: 2022-08-07 | Discharge: 2022-08-07 | Disposition: A | Discharge status: Home / Self Care | Payer: Medicaid Other | Source: Ambulatory Visit | Attending: Family Medicine | Admitting: Family Medicine

## 2022-08-07 ENCOUNTER — Other Ambulatory Visit: Payer: Self-pay

## 2022-08-07 VITALS — BP 110/73 | HR 84 | Wt 174.7 lb

## 2022-08-07 DIAGNOSIS — O0993 Supervision of high risk pregnancy, unspecified, third trimester: Secondary | ICD-10-CM

## 2022-08-07 DIAGNOSIS — O24013 Pre-existing diabetes mellitus, type 1, in pregnancy, third trimester: Secondary | ICD-10-CM

## 2022-08-07 DIAGNOSIS — O0990 Supervision of high risk pregnancy, unspecified, unspecified trimester: Secondary | ICD-10-CM

## 2022-08-07 DIAGNOSIS — Z98891 History of uterine scar from previous surgery: Secondary | ICD-10-CM

## 2022-08-07 DIAGNOSIS — O09522 Supervision of elderly multigravida, second trimester: Secondary | ICD-10-CM

## 2022-08-07 DIAGNOSIS — Z3A36 36 weeks gestation of pregnancy: Secondary | ICD-10-CM

## 2022-08-07 DIAGNOSIS — O09523 Supervision of elderly multigravida, third trimester: Secondary | ICD-10-CM

## 2022-08-07 NOTE — Progress Notes (Signed)
   PRENATAL VISIT NOTE  Subjective:  Theresa Koch is a 42 y.o. G4P3003 at [redacted]w[redacted]d being seen today for ongoing prenatal care.  She is currently monitored for the following issues for this high-risk pregnancy and has Supervision of high risk pregnancy, antepartum; AMA (advanced maternal age) multigravida 41+, second trimester; Type 1 diabetes mellitus during pregnancy; History of 2 cesarean sections; Anemia affecting pregnancy; and History of VBAC on their problem list.  Patient reports no complaints.  Contractions: Irritability. Vag. Bleeding: None.  Movement: Present. Denies leaking of fluid.   The following portions of the patient's history were reviewed and updated as appropriate: allergies, current medications, past family history, past medical history, past social history, past surgical history and problem list.   Objective:   Vitals:   08/07/22 1614  BP: 110/73  Pulse: 84  Weight: 174 lb 11.2 oz (79.2 kg)    Fetal Status: Fetal Heart Rate (bpm): 148 Fundal Height: 35 cm Movement: Present  Presentation: Vertex  General:  Alert, oriented and cooperative. Patient is in no acute distress.  Skin: Skin is warm and dry. No rash noted.   Cardiovascular: Normal heart rate noted  Respiratory: Normal respiratory effort, no problems with respiration noted  Abdomen: Soft, gravid, appropriate for gestational age.  Pain/Pressure: Present     Pelvic: Cervical exam performed in the presence of a chaperone Dilation: 1 Effacement (%): Thick Station: Ballotable  Extremities: Normal range of motion.  Edema: Mild pitting, slight indentation  Mental Status: Normal mood and affect. Normal behavior. Normal judgment and thought content.   Assessment and Plan:  Pregnancy: G4P3003 at [redacted]w[redacted]d 1. Supervision of high risk pregnancy, antepartum Cultures today - Cervicovaginal ancillary only( [place]) - Strep Gp B Culture+Rflx  2. Type 1 diabetes mellitus during pregnancy in third trimester No  book, reports CBGs are in range Last growth @ 81% In antenatal testing. IOL @ 38 weeks, scheduled for 8/16 Orders placed  3. History of VBAC Strongly desires TOLAC  4. AMA (advanced maternal age) multigravida 12+, second trimester LR NIPT  Preterm labor symptoms and general obstetric precautions including but not limited to vaginal bleeding, contractions, leaking of fluid and fetal movement were reviewed in detail with the patient. Please refer to After Visit Summary for other counseling recommendations.   Return in 1 week (on 08/14/2022).  Future Appointments  Date Time Provider Department Center  08/11/2022  1:30 PM [facility] NURSE [facility]  08/11/2022  1:45 PM [facility] US4 [facility]  10/14/2022  8:20 AM Carlus Pavlov, MD [facility] None    Reva Bores, MD

## 2022-08-11 ENCOUNTER — Encounter: Payer: Self-pay | Admitting: *Deleted

## 2022-08-11 ENCOUNTER — Ambulatory Visit: Payer: Medicaid Other | Admitting: *Deleted

## 2022-08-11 ENCOUNTER — Ambulatory Visit: Payer: Medicaid Other | Attending: Obstetrics and Gynecology

## 2022-08-11 VITALS — BP 115/62 | HR 79

## 2022-08-11 DIAGNOSIS — O99013 Anemia complicating pregnancy, third trimester: Secondary | ICD-10-CM | POA: Insufficient documentation

## 2022-08-11 DIAGNOSIS — O34219 Maternal care for unspecified type scar from previous cesarean delivery: Secondary | ICD-10-CM | POA: Diagnosis present

## 2022-08-11 DIAGNOSIS — E109 Type 1 diabetes mellitus without complications: Secondary | ICD-10-CM

## 2022-08-11 DIAGNOSIS — O0990 Supervision of high risk pregnancy, unspecified, unspecified trimester: Secondary | ICD-10-CM | POA: Insufficient documentation

## 2022-08-11 DIAGNOSIS — D649 Anemia, unspecified: Secondary | ICD-10-CM

## 2022-08-11 DIAGNOSIS — O24013 Pre-existing diabetes mellitus, type 1, in pregnancy, third trimester: Secondary | ICD-10-CM

## 2022-08-11 DIAGNOSIS — Z98891 History of uterine scar from previous surgery: Secondary | ICD-10-CM | POA: Insufficient documentation

## 2022-08-11 DIAGNOSIS — O09523 Supervision of elderly multigravida, third trimester: Secondary | ICD-10-CM | POA: Insufficient documentation

## 2022-08-11 DIAGNOSIS — Z3A37 37 weeks gestation of pregnancy: Secondary | ICD-10-CM

## 2022-08-17 ENCOUNTER — Other Ambulatory Visit: Payer: Self-pay | Admitting: Internal Medicine

## 2022-08-17 DIAGNOSIS — E1065 Type 1 diabetes mellitus with hyperglycemia: Secondary | ICD-10-CM

## 2022-08-18 ENCOUNTER — Ambulatory Visit: Payer: Medicaid Other | Attending: Maternal & Fetal Medicine

## 2022-08-18 ENCOUNTER — Ambulatory Visit: Payer: Medicaid Other

## 2022-08-19 ENCOUNTER — Ambulatory Visit (INDEPENDENT_AMBULATORY_CARE_PROVIDER_SITE_OTHER): Payer: Medicaid Other | Admitting: Family Medicine

## 2022-08-19 ENCOUNTER — Other Ambulatory Visit: Payer: Self-pay

## 2022-08-19 VITALS — BP 110/74 | HR 85 | Wt 176.1 lb

## 2022-08-19 DIAGNOSIS — O0990 Supervision of high risk pregnancy, unspecified, unspecified trimester: Secondary | ICD-10-CM

## 2022-08-19 DIAGNOSIS — Z3A38 38 weeks gestation of pregnancy: Secondary | ICD-10-CM

## 2022-08-19 DIAGNOSIS — O24013 Pre-existing diabetes mellitus, type 1, in pregnancy, third trimester: Secondary | ICD-10-CM

## 2022-08-19 DIAGNOSIS — Z98891 History of uterine scar from previous surgery: Secondary | ICD-10-CM

## 2022-08-19 DIAGNOSIS — O09522 Supervision of elderly multigravida, second trimester: Secondary | ICD-10-CM

## 2022-08-19 NOTE — Progress Notes (Signed)
   PRENATAL VISIT NOTE  Subjective:  Theresa Koch is a 42 y.o. G4P3003 at [redacted]w[redacted]d being seen today for ongoing prenatal care.  She is currently monitored for the following issues for this high-risk pregnancy and has Supervision of high risk pregnancy, antepartum; AMA (advanced maternal age) multigravida 34+, second trimester; Type 1 diabetes mellitus during pregnancy; History of 2 cesarean sections; Anemia affecting pregnancy; and History of VBAC on their problem list.  Patient reports no complaints.  Contractions: Irritability. Vag. Bleeding: None.  Movement: Present. Denies leaking of fluid.   The following portions of the patient's history were reviewed and updated as appropriate: allergies, current medications, past family history, past medical history, past social history, past surgical history and problem list.   Objective:   Vitals:   08/19/22 1415  BP: 110/74  Pulse: 85  Weight: 176 lb 1.6 oz (79.9 kg)    Fetal Status: Fetal Heart Rate (bpm): 159   Movement: Present     General:  Alert, oriented and cooperative. Patient is in no acute distress.  Skin: Skin is warm and dry. No rash noted.   Cardiovascular: Normal heart rate noted  Respiratory: Normal respiratory effort, no problems with respiration noted  Abdomen: Soft, gravid, appropriate for gestational age.  Pain/Pressure: Present     Pelvic: Cervical exam deferred        Extremities: Normal range of motion.  Edema: Mild pitting, slight indentation  Mental Status: Normal mood and affect. Normal behavior. Normal judgment and thought content.   Assessment and Plan:  Pregnancy: G4P3003 at [redacted]w[redacted]d 1. Type 1 diabetes mellitus during pregnancy in third trimester Reports most are in range. Had a low overnight 2 nights ago and noted to have high after intake to try to improve it. Missed her Antenatal testing with MFM--booked for BPP here tomorrow. IOL 38 weeks  2. Supervision of high risk pregnancy, antepartum   3.  History of 2 cesarean sections Desires TOLAC  4. AMA (advanced maternal age) multigravida 26+, second trimester LR NIPT  5. History of VBAC Strongly desires VBAC again  6. [redacted] weeks gestation of pregnancy   Preterm labor symptoms and general obstetric precautions including but not limited to vaginal bleeding, contractions, leaking of fluid and fetal movement were reviewed in detail with the patient. Please refer to After Visit Summary for other counseling recommendations.   Return in 1 week (on 08/26/2022).  Future Appointments  Date Time Provider Department Center  08/20/2022  1:15 PM [facility] US2 [facility]  08/21/2022  8:30 AM [facility] NURSE [facility]  08/21/2022  8:45 AM [facility] NST [facility]  08/22/2022 12:00 AM [facility] SCHED ROOM [facility] None  10/14/2022  8:20 AM Carlus Pavlov, MD [facility] None    Reva Bores, MD

## 2022-08-19 NOTE — Addendum Note (Signed)
Addended by: Faythe Casa on: 08/19/2022 05:01 PM   Modules accepted: Orders

## 2022-08-20 ENCOUNTER — Ambulatory Visit (INDEPENDENT_AMBULATORY_CARE_PROVIDER_SITE_OTHER): Payer: Medicaid Other

## 2022-08-20 ENCOUNTER — Telehealth (HOSPITAL_COMMUNITY): Payer: Self-pay | Admitting: *Deleted

## 2022-08-20 ENCOUNTER — Encounter (HOSPITAL_COMMUNITY): Payer: Self-pay | Admitting: *Deleted

## 2022-08-20 ENCOUNTER — Other Ambulatory Visit: Payer: Self-pay | Admitting: Advanced Practice Midwife

## 2022-08-20 DIAGNOSIS — Z3A38 38 weeks gestation of pregnancy: Secondary | ICD-10-CM

## 2022-08-20 DIAGNOSIS — O24013 Pre-existing diabetes mellitus, type 1, in pregnancy, third trimester: Secondary | ICD-10-CM

## 2022-08-20 DIAGNOSIS — O09523 Supervision of elderly multigravida, third trimester: Secondary | ICD-10-CM | POA: Diagnosis not present

## 2022-08-20 DIAGNOSIS — O09522 Supervision of elderly multigravida, second trimester: Secondary | ICD-10-CM

## 2022-08-20 NOTE — Telephone Encounter (Signed)
Preadmission screen  

## 2022-08-21 ENCOUNTER — Ambulatory Visit: Payer: Medicaid Other

## 2022-08-21 DIAGNOSIS — O24013 Pre-existing diabetes mellitus, type 1, in pregnancy, third trimester: Secondary | ICD-10-CM | POA: Diagnosis present

## 2022-08-21 MED ORDER — LIDOCAINE HCL (PF) 1 % IJ SOLN: 30.0000 mL | INTRAMUSCULAR | Status: DC | PRN

## 2022-08-21 MED ORDER — LACTATED RINGERS IV SOLN: INTRAVENOUS | Status: DC

## 2022-08-21 MED ORDER — TERBUTALINE SULFATE 1 MG/ML IJ SOLN: 0.2500 mg | Freq: Once | INTRAMUSCULAR | Status: DC | PRN

## 2022-08-21 MED ORDER — SOD CITRATE-CITRIC ACID 500-334 MG/5ML PO SOLN: 30.0000 mL | ORAL | Status: DC | PRN

## 2022-08-21 MED ORDER — OXYCODONE-ACETAMINOPHEN 5-325 MG PO TABS: 1.0000 | ORAL_TABLET | ORAL | Status: DC | PRN

## 2022-08-21 MED ORDER — LACTATED RINGERS IV SOLN: 500.0000 mL | INTRAVENOUS | Status: DC | PRN

## 2022-08-21 MED ORDER — ONDANSETRON HCL 4 MG/2ML IJ SOLN: 4.0000 mg | Freq: Four times a day (QID) | INTRAMUSCULAR | Status: DC | PRN

## 2022-08-21 MED ORDER — OXYCODONE-ACETAMINOPHEN 5-325 MG PO TABS: 2.0000 | ORAL_TABLET | ORAL | Status: DC | PRN

## 2022-08-21 MED ORDER — FLEET ENEMA 7-19 GM/118ML RE ENEM: 1.0000 | ENEMA | RECTAL | Status: DC | PRN

## 2022-08-21 MED ORDER — OXYTOCIN-SODIUM CHLORIDE 30-0.9 UT/500ML-% IV SOLN
1.0000 m[IU]/min | INTRAVENOUS | Status: DC
  Administered 2022-08-22: 2 m[IU]/min via INTRAVENOUS
  Filled 2022-08-21: qty 500

## 2022-08-21 MED ORDER — OXYTOCIN BOLUS FROM INFUSION
333.0000 mL | Freq: Once | INTRAVENOUS | Status: AC
  Administered 2022-08-23: 333 mL via INTRAVENOUS

## 2022-08-21 MED ORDER — OXYTOCIN-SODIUM CHLORIDE 30-0.9 UT/500ML-% IV SOLN
2.5000 [IU]/h | INTRAVENOUS | Status: DC
  Filled 2022-08-21: qty 500

## 2022-08-21 MED ORDER — ACETAMINOPHEN 325 MG PO TABS: 650.0000 mg | ORAL_TABLET | ORAL | Status: DC | PRN

## 2022-08-22 ENCOUNTER — Encounter (HOSPITAL_COMMUNITY): Payer: Self-pay | Admitting: Family Medicine

## 2022-08-22 ENCOUNTER — Other Ambulatory Visit: Payer: Self-pay

## 2022-08-22 ENCOUNTER — Inpatient Hospital Stay (HOSPITAL_COMMUNITY): Payer: Medicaid Other

## 2022-08-22 ENCOUNTER — Inpatient Hospital Stay (HOSPITAL_COMMUNITY)
Admission: RE | Admit: 2022-08-22 | Discharge: 2022-08-24 | DRG: 807 | Disposition: A | Discharge status: Home / Self Care | Payer: Medicaid Other | Attending: Obstetrics and Gynecology | Admitting: Obstetrics and Gynecology

## 2022-08-22 DIAGNOSIS — O24424 Gestational diabetes mellitus in childbirth, insulin controlled: Secondary | ICD-10-CM | POA: Diagnosis not present

## 2022-08-22 DIAGNOSIS — Z98891 History of uterine scar from previous surgery: Secondary | ICD-10-CM

## 2022-08-22 DIAGNOSIS — N90810 Female genital mutilation status, unspecified: Secondary | ICD-10-CM | POA: Diagnosis present

## 2022-08-22 DIAGNOSIS — O3483 Maternal care for other abnormalities of pelvic organs, third trimester: Secondary | ICD-10-CM | POA: Diagnosis present

## 2022-08-22 DIAGNOSIS — O24013 Pre-existing diabetes mellitus, type 1, in pregnancy, third trimester: Principal | ICD-10-CM | POA: Diagnosis present

## 2022-08-22 DIAGNOSIS — O9902 Anemia complicating childbirth: Secondary | ICD-10-CM | POA: Diagnosis present

## 2022-08-22 DIAGNOSIS — Z880 Allergy status to penicillin: Secondary | ICD-10-CM | POA: Diagnosis not present

## 2022-08-22 DIAGNOSIS — O34211 Maternal care for low transverse scar from previous cesarean delivery: Secondary | ICD-10-CM | POA: Diagnosis not present

## 2022-08-22 DIAGNOSIS — O09523 Supervision of elderly multigravida, third trimester: Secondary | ICD-10-CM | POA: Diagnosis not present

## 2022-08-22 DIAGNOSIS — Z3A38 38 weeks gestation of pregnancy: Secondary | ICD-10-CM

## 2022-08-22 DIAGNOSIS — O34219 Maternal care for unspecified type scar from previous cesarean delivery: Secondary | ICD-10-CM | POA: Diagnosis present

## 2022-08-22 DIAGNOSIS — O2402 Pre-existing diabetes mellitus, type 1, in childbirth: Principal | ICD-10-CM | POA: Diagnosis present

## 2022-08-22 DIAGNOSIS — O09522 Supervision of elderly multigravida, second trimester: Secondary | ICD-10-CM | POA: Diagnosis present

## 2022-08-22 DIAGNOSIS — Z794 Long term (current) use of insulin: Secondary | ICD-10-CM

## 2022-08-22 DIAGNOSIS — E109 Type 1 diabetes mellitus without complications: Secondary | ICD-10-CM | POA: Diagnosis present

## 2022-08-22 LAB — GLUCOSE, CAPILLARY
Glucose-Capillary: 103 mg/dL — ABNORMAL HIGH (ref 70–99)
Glucose-Capillary: 106 mg/dL — ABNORMAL HIGH (ref 70–99)
Glucose-Capillary: 109 mg/dL — ABNORMAL HIGH (ref 70–99)
Glucose-Capillary: 109 mg/dL — ABNORMAL HIGH (ref 70–99)
Glucose-Capillary: 112 mg/dL — ABNORMAL HIGH (ref 70–99)
Glucose-Capillary: 127 mg/dL — ABNORMAL HIGH (ref 70–99)
Glucose-Capillary: 138 mg/dL — ABNORMAL HIGH (ref 70–99)
Glucose-Capillary: 152 mg/dL — ABNORMAL HIGH (ref 70–99)
Glucose-Capillary: 155 mg/dL — ABNORMAL HIGH (ref 70–99)
Glucose-Capillary: 177 mg/dL — ABNORMAL HIGH (ref 70–99)
Glucose-Capillary: 205 mg/dL — ABNORMAL HIGH (ref 70–99)
Glucose-Capillary: 50 mg/dL — ABNORMAL LOW (ref 70–99)
Glucose-Capillary: 55 mg/dL — ABNORMAL LOW (ref 70–99)
Glucose-Capillary: 57 mg/dL — ABNORMAL LOW (ref 70–99)
Glucose-Capillary: 62 mg/dL — ABNORMAL LOW (ref 70–99)
Glucose-Capillary: 79 mg/dL (ref 70–99)
Glucose-Capillary: 84 mg/dL (ref 70–99)
Glucose-Capillary: 85 mg/dL (ref 70–99)
Glucose-Capillary: 86 mg/dL (ref 70–99)
Glucose-Capillary: 87 mg/dL (ref 70–99)
Glucose-Capillary: 92 mg/dL (ref 70–99)
Glucose-Capillary: 94 mg/dL (ref 70–99)
Glucose-Capillary: 97 mg/dL (ref 70–99)

## 2022-08-22 LAB — TYPE AND SCREEN
ABO/RH(D): O POS
Antibody Screen: NEGATIVE

## 2022-08-22 LAB — CBC
HCT: 30.9 % — ABNORMAL LOW (ref 36.0–46.0)
Hemoglobin: 9.6 g/dL — ABNORMAL LOW (ref 12.0–15.0)
MCH: 24.7 pg — ABNORMAL LOW (ref 26.0–34.0)
MCHC: 31.1 g/dL (ref 30.0–36.0)
MCV: 79.4 fL — ABNORMAL LOW (ref 80.0–100.0)
Platelets: 107 10*3/uL — ABNORMAL LOW (ref 150–400)
RBC: 3.89 MIL/uL (ref 3.87–5.11)
RDW: 25.2 % — ABNORMAL HIGH (ref 11.5–15.5)
WBC: 4.8 10*3/uL (ref 4.0–10.5)
nRBC: 0 % (ref 0.0–0.2)

## 2022-08-22 LAB — COMPREHENSIVE METABOLIC PANEL
ALT: 17 U/L (ref 0–44)
AST: 30 U/L (ref 15–41)
Albumin: 2.3 g/dL — ABNORMAL LOW (ref 3.5–5.0)
Alkaline Phosphatase: 108 U/L (ref 38–126)
Anion gap: 10 (ref 5–15)
BUN: 9 mg/dL (ref 6–20)
CO2: 16 mmol/L — ABNORMAL LOW (ref 22–32)
Calcium: 8.2 mg/dL — ABNORMAL LOW (ref 8.9–10.3)
Chloride: 106 mmol/L (ref 98–111)
Creatinine, Ser: 0.7 mg/dL (ref 0.44–1.00)
GFR, Estimated: >60 mL/min (ref >60)
Glucose, Bld: 158 mg/dL — ABNORMAL HIGH (ref 70–99)
Potassium: 3.5 mmol/L (ref 3.5–5.1)
Sodium: 132 mmol/L — ABNORMAL LOW (ref 135–145)
Total Bilirubin: 0.4 mg/dL (ref 0.3–1.2)
Total Protein: 5.6 g/dL — ABNORMAL LOW (ref 6.5–8.1)

## 2022-08-22 LAB — RPR: RPR Ser Ql: NONREACTIVE

## 2022-08-22 MED ORDER — INSULIN REGULAR(HUMAN) IN NACL 100-0.9 UT/100ML-% IV SOLN
INTRAVENOUS | Status: DC
  Administered 2022-08-22: 0.4 [IU]/h via INTRAVENOUS
  Administered 2022-08-23: 1.3 [IU]/h via INTRAVENOUS
  Filled 2022-08-22 (×2): qty 100

## 2022-08-22 MED ORDER — LACTATED RINGERS IV SOLN: INTRAVENOUS | Status: DC

## 2022-08-22 MED ORDER — DEXTROSE IN LACTATED RINGERS 5 % IV SOLN: INTRAVENOUS | Status: DC

## 2022-08-22 MED ORDER — DEXTROSE 50 % IV SOLN
0.0000 mL | INTRAVENOUS | Status: DC | PRN
  Administered 2022-08-22: 30 mL via INTRAVENOUS
  Filled 2022-08-22 (×2): qty 50

## 2022-08-22 NOTE — Progress Notes (Signed)
Pt. CBG at 15:20 was 55. Endotool recommends 25 mL of D50 for hypoglycemia. Pt requested to drink 8 oz of juice and then reevaluate if the D50 is needed as she would prefer to not receive this treatment.   8 oz of juice given. RN will reevaluate per endotool time recommendation.

## 2022-08-22 NOTE — Inpatient Diabetes Management (Signed)
Inpatient Diabetes Program Recommendations  ADA Standards of Care 2021 Diabetes in Pregnancy Target Glucose Ranges:  Fasting: 60 - 90 mg/dL Preprandial: 60 - 161 mg/dL 1 hr postprandial: Less than 140mg /dL (from first bite of meal) 2 hr postprandial: Less than 120 mg/dL (from first bit of meal)    Lab Results  Component Value Date   GLUCAP 97 08/22/2022   HGBA1C 6.2 (A) 06/30/2022    Review of Glycemic Control  Latest Reference Range & Units 08/22/22 07:05 08/22/22 07:31 08/22/22 08:35 08/22/22 09:45  Glucose-Capillary 70 - 99 mg/dL 50 (L) 92 87 97  (L): Data is abnormally low  Diabetes history: T1DM  Outpatient Diabetes medications:  Per endocrinology, [name] note - Basaglar 16 units in am  - Humalog:  6-8 (10) units 15-30 before tea in am  8-10 (12 - at work) units before lunch  4 units before dinner  At night, do not use more than 1-2 units NovoLog for correction.  When you correct a low, do not use more then 15-30 g carbs.   Current orders for Inpatient glycemic control: IV insulin  Per endocrinology, Dr. Charlean Sanfilippo note:  After you give birth:  - Basaglar 10-12 units daily  - Humalog:  B and L: 5-6 units  D: 3-4 units   Inpatient DM recommendations:  Once delivered:  Semglee 10 units every day at delivery and then every day Novolog 0-9 units TID and 0-5 units at bedtime Noovlog 3 units TID with meals if she consumes at least 50%  Spoke with her at bedside.  Discussed Dr. Charlean Sanfilippo recommendations postpartum.    Will continue to follow while inpatient.  Thank you, [name], MSN, CDCES Diabetes Coordinator Inpatient Diabetes Program [phone] (team pager from 8a-5p)

## 2022-08-22 NOTE — Progress Notes (Signed)
Labor Progress Note Theresa Koch is a 42 y.o. G4P3003 at [redacted]w[redacted]d presented for IOL d/t T1DM.  S: Contractions feeling more painful.   O:  BP 108/67   Pulse 72   Temp 98.2 F (36.8 C)   Resp 16   Ht 5\' 5"  (1.651 m)   Wt 79.8 kg   LMP 11/15/2021 (Exact Date) Comment: Irreg due to IUD removal  SpO2 100%   BMI 29.29 kg/m  EFM: 160, mod var, no decels, + accels  CVE: Dilation: 4 Effacement (%): 60 Cervical Position: Posterior Station: -3 Presentation: Vertex Exam by:: Dr. Para March AROM to clear fluid with pt consent.  BSUS done to confirm cephalic.   A&P: 42 y.o. [id number] [redacted]w[redacted]d presented for IOL d/t T1DM.  #Labor: Progressing well. Continue pitocin. AROM performed with pt permission. May titrate down pitocin as AROM takes effect. Anticipate successful VBAC - she has prior vbac of 3436g baby. Last [place] for this pregnancy on 7/22 was 2958g.  #Pain: Epidural if/when desired.  #FWB: Category 1  #GBS negative T1DM - on Endotool. Most recent 3 CBGs very well controlled.   Milas Hock, MD 10:46 PM

## 2022-08-22 NOTE — H&P (Addendum)
LABOR H&P Theresa Koch is a 42 y.o. [phone] female at [redacted]w[redacted]d presenting for IOL due to Type 1 diabetes mellitus during pregnancy. AMA multigravida 40+. History of 2 cesarean sections; Anemia affecting pregnancy.    Reports active fetal movement, contractions: none, vaginal bleeding: none, membranes: intact.  Initiated prenatal care at [facility].     This pregnancy complicated by: Type 1 diabetes mellitus during pregnancy in third trimester        CBG readings on Libra reports highs and lows. 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   Previous cesarean section 10/30/2014   TOLAC successful 2016    Pure hypercholesterolemia 03/15/2021   Seasonal allergies    Type 1 diabetes mellitus with hyperglycemia, with long-term current use of insulin (HCC) 05/23/2015    Past Surgical History: Past Surgical History:  Procedure Laterality Date   CESAREAN SECTION     CESAREAN SECTION N/A 05/09/2016   Procedure: CESAREAN SECTION;  Surgeon: Carrington Clamp, MD;  Location: [facility] BIRTHING SUITES;  Service: Obstetrics;  Laterality: N/A;    Obstetrical History: OB History  Gravida Para Term Preterm AB Living  4 3 3     3   SAB IAB Ectopic Multiple Live Births        0 3    # Outcome Date GA Lbr Len/2nd Weight Sex Type Anes PTL Lv  4 Current           3 Term 05/09/16 [redacted]w[redacted]d  3300 g F CS-LTranv Spinal  LIV  2 Term 08/09/14 [redacted]w[redacted]d / 01:32 3436 g F Vag-Spont EPI  LIV  1 Term 02/28/12 [redacted]w[redacted]d  2700 g F CS-LTranv EPI N LIV    Obstetric Comments  Previous cs for diabetes    Social History: Social History   Socioeconomic History   Marital status:  Married    Spouse name: Not on file   Number of children: Not on file   Years of education: Not on file   Highest education level: Not on file  Occupational History   Not on file  Tobacco Use   Smoking status: Never   Smokeless tobacco: Never  Vaping Use   Vaping status: Never Used  Substance and Sexual Activity   Alcohol use: No    Alcohol/week: 0.0 standard drinks of alcohol   Drug use: No   Sexual activity: Yes    Birth control/protection: None  Other Topics Concern   Not on file  Social History Narrative   Not on file   Social Determinants of Health   Financial Resource Strain: Not on file  Food Insecurity: No Food Insecurity (08/22/2022)   Hunger Vital Sign    Worried About Running Out of Food in the Last Year: Never true    Ran Out of Food in the Last Year: Never true  Transportation Needs: No Transportation Needs (08/22/2022)   PRAPARE - Administrator, Civil Service (Medical): No    Lack of Transportation (Non-Medical): No  Physical Activity: Not on file  Stress: Not on file  Social Connections: Not on file    Allergies: Allergies  Allergen Reactions   Penicillins Rash  Has patient had a PCN reaction causing immediate rash, facial/tongue/throat swelling, SOB or lightheadedness with hypotension: Yes Has patient had a PCN reaction causing severe rash involving mucus membranes or skin necrosis: No Has patient had a PCN reaction that required hospitalization No Has patient had a PCN reaction occurring within the last 10 years: No If all of the above answers are "NO", then may proceed with Cephalosporin use. Childhood allergy     Medications Prior to Admission  Medication Sig Dispense Refill Last Dose   ACCU-CHEK SOFTCLIX LANCETS lancets USE TO TEST BLOOD SUGAR 6 TIMES DAILY AS INSTRUCTED. DX CODE: U44.034 (Patient not taking: Reported on 08/19/2022) 200 each 3    aspirin EC 81 MG tablet Take 1 tablet (81 mg total) by mouth daily. Take after 12  weeks for prevention of preeclampssia later in pregnancy 300 tablet 2    BD PEN NEEDLE NANO 2ND GEN 32G X 4 MM MISC USE AS DIRECTED FIVE TIMES DAILY 400 each 3    Blood Glucose Monitoring Suppl (ACCU-CHEK GUIDE) w/Device KIT 1 Device by Does not apply route as needed. 1 kit 2    Continuous Blood Gluc Receiver (FREESTYLE LIBRE 2 READER) DEVI 1 EACH BY DOES NOT APPLY ROUTE ONCE FOR 1 DOSE. (Patient not taking: Reported on 08/19/2022) 1 each 0    Continuous Glucose Sensor (FREESTYLE LIBRE 2 SENSOR) MISC CHANGE EVERY 14 DAYS 6 each 3    Glucagon 3 MG/DOSE POWD Place 3 mg into the nose once as needed for up to 1 dose. 1 each 11    glucose blood test strip Use as instructed 100 each 12    Insulin Glargine (BASAGLAR KWIKPEN) 100 UNIT/ML INJECT 20 UNITS INTO THE SKIN DAILY 9 mL 0    insulin lispro (HUMALOG KWIKPEN) 100 UNIT/ML KwikPen INJECT UP TO 20 UNITS INTO THE SKIN DAILY AS ADVISED. 30 mL 3    ondansetron (ZOFRAN) 4 MG tablet Take 1 tablet (4 mg total) by mouth every 8 (eight) hours as needed for nausea or vomiting. 20 tablet 0    prenatal vitamin w/FE, FA (PRENATAL 1 + 1) 27-1 MG TABS tablet Take 1 tablet by mouth daily at 12 noon. 30 tablet 12     Review of Systems  Pertinent pos/neg as indicated in HPI  Blood pressure 109/66, pulse 75, temperature 98 F (36.7 C), temperature source Oral, resp. rate 17, height 5\' 5"  (1.651 m), weight 79.8 kg, last menstrual period 11/15/2021, SpO2 98%, currently breastfeeding. General appearance: alert and cooperative Lungs: clear to auscultation bilaterally Heart: regular rate and rhythm Abdomen: gravid, soft, non-tender,  Extremities: none edema  Fetal monitoring: FHR: 155 bpm, variability: moderate,  Accelerations: Present,  decelerations:  Absent Uterine activity: irregular SVE: Dilation: 1 Effacement (%): 50 Station: -2 Exam by:: Dia Sitter CNM, very posterior Presentation: cephalic   Prenatal labs: ABO, Rh: O/Positive/-- (02/19  1016) Antibody: Negative (02/19 1016) Rubella: 9.20 (02/19 1016) RPR: Non Reactive (05/20 0906)  HBsAg: Negative (02/19 1016)  HIV: Non Reactive (05/20 0906)  HepC: Non Reactive (02/19 1016) GBS: Negative/-- (08/01 1647)    No results found for this or any previous visit (from the past 24 hour(s)).   Assessment:  [redacted]w[redacted]d SIUP  G4P3003  Cat 1 FHR  GBS Negative/-- (08/01 1647)  Plan:  Admit to L&D IOL   IV pain meds/epidural prn active labor  IV Pitocin 2x2,  Pt amenable to foley when cx becomes more anterior  Offered AROM as part of IOL process, declined  wants to wait until active labor   Endotool protocol  Planned mode of delivery: VBAC   Planned infant feeding: breastfeeding  Planned contraception: undecided   Cleda Mccreedy SNM 08/22/2022, 2:16 AM   I personally saw and evaluated the patient, performing the key elements of the service. I developed and verified the management plan that is described in the resident's/student's note, and I agree with the content with my edits above. VSS, HRR&R, Resp unlabored, Legs neg.  Cathie Beams, CNM 08/22/2022 5:45 AM

## 2022-08-22 NOTE — Progress Notes (Signed)
Labor Progress Note Theresa Koch is a 42 y.o. [phone] at [redacted]w[redacted]d presented for IOL due to AMA, T1DM, and TOLAC with hx of successful VBAC in second pregnancy.  S: Tolerating labor well.   O:  BP 128/70   Pulse 70   Temp 98.8 F (37.1 C) (Oral)   Resp 16   Ht 5\' 5"  (1.651 m)   Wt 79.8 kg   LMP 11/15/2021 (Exact Date) Comment: Irreg due to IUD removal  SpO2 100%   BMI 29.29 kg/m  EFM: 145/moderate variability/accel present/no decels; Category I  CVE: Dilation: 3.5 Effacement (%): 50 Cervical Position: Posterior Station: -3 Presentation: Vertex Exam by:: Lorn Junes, RNC   A&P: 42 y.o. [id number] [redacted]w[redacted]d admitted for IOL #Labor: Progressing slowly. Offered AROM mother declined for now, may consider at next check in 3 hours time. On 20u pitocin. #Pain: Well controlled without medication. #FWB: Cat I #GBS negative T1DM on EndoTool IV insulin, sugars well controlled.  Wyn Forster, MD OB Fellow [facility]  4:39 PM

## 2022-08-22 NOTE — Progress Notes (Addendum)
LABOR NOTE Theresa Koch is a 42 y.o. G4P3003 at [redacted]w[redacted]d admitted for induction of labor due to advanced maternal age, diabetes mellitus Type 1, and TOLAC with a successful VBAC previously.   Subjective: no complaints patient comfortable  Objective: BP 121/69   Pulse 70   Temp 98 F (36.7 C) (Oral)   Resp 17   Ht 5\' 5"  (1.651 m)   Wt 79.8 kg   LMP 11/15/2021 (Exact Date) Comment: Irreg due to IUD removal  SpO2 100%   BMI 29.29 kg/m  No intake/output data recorded.  FHR baseline 140 bpm, Variability: moderate, Accelerations:present, Decelerations:  Absent Toco: none   SVE:   Dilation: 1 Effacement (%): 50 Station: -2 Exam by:: AYetta Barre, SNM  Pitocin @ 8 mu/min  Labs: Lab Results  Component Value Date   WBC 4.8 08/22/2022   HGB 9.6 (L) 08/22/2022   HCT 30.9 (L) 08/22/2022   MCV 79.4 (L) 08/22/2022   PLT 107 (L) 08/22/2022    Assessment / Plan: Foley bulb placed with speculum. 40ml of saline placed in Foley bulb. Traction applied. Continue pitocin 2x2 currently at u. Will continue to manage.  Pt declines AROM until active labor  Labor: early Fetal Wellbeing:  Category I Pain Control:  n/a Pre-eclampsia: N/A I/D:  GBS neg Anticipated MOD: VBAC  Cleda Mccreedy SNM 08/22/2022, 8:03 AM

## 2022-08-22 NOTE — Progress Notes (Signed)
At 0431am patietn blood glucose 57mg /dl,patient drank a glass 8oz of apple juice.Patient does not want RN to give 20 ml of 5DW iv bolus as suggested  per endotool.She requested to recheck in .At 0441am Per patient dexcom 79mg  dl.

## 2022-08-23 ENCOUNTER — Inpatient Hospital Stay (HOSPITAL_COMMUNITY): Payer: Medicaid Other | Admitting: Anesthesiology

## 2022-08-23 ENCOUNTER — Encounter (HOSPITAL_COMMUNITY): Payer: Self-pay | Admitting: Family Medicine

## 2022-08-23 DIAGNOSIS — O24424 Gestational diabetes mellitus in childbirth, insulin controlled: Secondary | ICD-10-CM

## 2022-08-23 DIAGNOSIS — O09523 Supervision of elderly multigravida, third trimester: Secondary | ICD-10-CM

## 2022-08-23 DIAGNOSIS — O34211 Maternal care for low transverse scar from previous cesarean delivery: Secondary | ICD-10-CM

## 2022-08-23 DIAGNOSIS — Z3A38 38 weeks gestation of pregnancy: Secondary | ICD-10-CM

## 2022-08-23 LAB — CBC
HCT: 34.3 % — ABNORMAL LOW (ref 36.0–46.0)
HCT: 31.9 % — ABNORMAL LOW (ref 36.0–46.0)
Hemoglobin: 11 g/dL — ABNORMAL LOW (ref 12.0–15.0)
Hemoglobin: 10.1 g/dL — ABNORMAL LOW (ref 12.0–15.0)
MCH: 25.8 pg — ABNORMAL LOW (ref 26.0–34.0)
MCH: 25.2 pg — ABNORMAL LOW (ref 26.0–34.0)
MCHC: 32.1 g/dL (ref 30.0–36.0)
MCHC: 31.7 g/dL (ref 30.0–36.0)
MCV: 80.5 fL (ref 80.0–100.0)
MCV: 79.6 fL — ABNORMAL LOW (ref 80.0–100.0)
Platelets: 104 10*3/uL — ABNORMAL LOW (ref 150–400)
Platelets: 107 10*3/uL — ABNORMAL LOW (ref 150–400)
RBC: 4.26 MIL/uL (ref 3.87–5.11)
RBC: 4.01 MIL/uL (ref 3.87–5.11)
RDW: 25.2 % — ABNORMAL HIGH (ref 11.5–15.5)
RDW: 25.1 % — ABNORMAL HIGH (ref 11.5–15.5)
WBC: 7.8 10*3/uL (ref 4.0–10.5)
WBC: 10.3 10*3/uL (ref 4.0–10.5)
nRBC: 0 % (ref 0.0–0.2)
nRBC: 0 % (ref 0.0–0.2)

## 2022-08-23 MED ORDER — IBUPROFEN 600 MG PO TABS
600.0000 mg | ORAL_TABLET | Freq: Four times a day (QID) | ORAL | Status: DC
  Administered 2022-08-23 – 2022-08-24 (×4): 600 mg via ORAL
  Filled 2022-08-23 (×4): qty 1

## 2022-08-23 MED ORDER — INSULIN GLARGINE-YFGN 100 UNIT/ML ~~LOC~~ SOLN
10.0000 [IU] | SUBCUTANEOUS | Status: DC
  Filled 2022-08-23: qty 0.1

## 2022-08-23 MED ORDER — INSULIN ASPART 100 UNIT/ML IJ SOLN
3.0000 [IU] | Freq: Every day | INTRAMUSCULAR | Status: DC
  Administered 2022-08-23 (×2): 3 [IU] via SUBCUTANEOUS

## 2022-08-23 MED ORDER — INSULIN ASPART 100 UNIT/ML IJ SOLN
0.0000 [IU] | Freq: Three times a day (TID) | INTRAMUSCULAR | Status: DC
  Administered 2022-08-24: 1 [IU] via SUBCUTANEOUS

## 2022-08-23 MED ORDER — INSULIN ASPART 100 UNIT/ML IJ SOLN: 0.0000 [IU] | Freq: Every day | INTRAMUSCULAR | Status: DC

## 2022-08-23 MED ORDER — INSULIN ASPART 100 UNIT/ML IJ SOLN
5.0000 [IU] | Freq: Every day | INTRAMUSCULAR | Status: DC
  Administered 2022-08-24: 5 [IU] via SUBCUTANEOUS

## 2022-08-23 MED ORDER — INSULIN GLARGINE-YFGN 100 UNIT/ML ~~LOC~~ SOLN
10.0000 [IU] | Freq: Every day | SUBCUTANEOUS | Status: DC
  Administered 2022-08-23: 10 [IU] via SUBCUTANEOUS
  Filled 2022-08-23: qty 0.1

## 2022-08-23 MED ORDER — ACETAMINOPHEN 325 MG PO TABS
650.0000 mg | ORAL_TABLET | ORAL | Status: DC | PRN
  Administered 2022-08-24: 650 mg via ORAL
  Filled 2022-08-23: qty 2

## 2022-08-23 MED ORDER — EPHEDRINE 5 MG/ML INJ: 10.0000 mg | INTRAVENOUS | Status: DC | PRN

## 2022-08-23 MED ORDER — ONDANSETRON HCL 4 MG PO TABS: 4.0000 mg | ORAL_TABLET | ORAL | Status: DC | PRN

## 2022-08-23 MED ORDER — TRANEXAMIC ACID-NACL 1000-0.7 MG/100ML-% IV SOLN
1000.0000 mg | INTRAVENOUS | Status: AC
  Administered 2022-08-23: 1000 mg via INTRAVENOUS
  Filled 2022-08-23: qty 100

## 2022-08-23 MED ORDER — INSULIN ASPART 100 UNIT/ML IJ SOLN: 3.0000 [IU] | Freq: Three times a day (TID) | INTRAMUSCULAR | Status: DC

## 2022-08-23 MED ORDER — LIDOCAINE-EPINEPHRINE (PF) 1.5 %-1:200000 IJ SOLN
INTRAMUSCULAR | Status: DC | PRN
  Administered 2022-08-23: 5 mL via EPIDURAL

## 2022-08-23 MED ORDER — COCONUT OIL OIL: 1.0000 | TOPICAL_OIL | Status: DC | PRN

## 2022-08-23 MED ORDER — DIPHENHYDRAMINE HCL 50 MG/ML IJ SOLN: 12.5000 mg | INTRAMUSCULAR | Status: DC | PRN

## 2022-08-23 MED ORDER — LIDOCAINE HCL (PF) 1 % IJ SOLN
INTRAMUSCULAR | Status: DC | PRN
  Administered 2022-08-23: 5 mL via EPIDURAL

## 2022-08-23 MED ORDER — PHENYLEPHRINE 80 MCG/ML (10ML) SYRINGE FOR IV PUSH (FOR BLOOD PRESSURE SUPPORT)
80.0000 ug | PREFILLED_SYRINGE | INTRAVENOUS | Status: DC | PRN
  Filled 2022-08-23: qty 10

## 2022-08-23 MED ORDER — FENTANYL CITRATE (PF) 100 MCG/2ML IJ SOLN
100.0000 ug | INTRAMUSCULAR | Status: DC | PRN
  Administered 2022-08-23: 100 ug via INTRAVENOUS

## 2022-08-23 MED ORDER — PRENATAL MULTIVITAMIN CH
1.0000 | ORAL_TABLET | Freq: Every day | ORAL | Status: DC
  Administered 2022-08-24: 1 via ORAL
  Filled 2022-08-23: qty 1

## 2022-08-23 MED ORDER — TETANUS-DIPHTH-ACELL PERTUSSIS 5-2.5-18.5 LF-MCG/0.5 IM SUSY: 0.5000 mL | PREFILLED_SYRINGE | Freq: Once | INTRAMUSCULAR | Status: DC

## 2022-08-23 MED ORDER — DIBUCAINE (PERIANAL) 1 % EX OINT: 1.0000 | TOPICAL_OINTMENT | CUTANEOUS | Status: DC | PRN

## 2022-08-23 MED ORDER — FENTANYL-BUPIVACAINE-NACL 0.5-0.125-0.9 MG/250ML-% EP SOLN
12.0000 mL/h | EPIDURAL | Status: DC | PRN
  Administered 2022-08-23: 12 mL/h via EPIDURAL
  Filled 2022-08-23: qty 250

## 2022-08-23 MED ORDER — ZOLPIDEM TARTRATE 5 MG PO TABS: 5.0000 mg | ORAL_TABLET | Freq: Every evening | ORAL | Status: DC | PRN

## 2022-08-23 MED ORDER — BENZOCAINE-MENTHOL 20-0.5 % EX AERO: 1.0000 | INHALATION_SPRAY | CUTANEOUS | Status: DC | PRN

## 2022-08-23 MED ORDER — LACTATED RINGERS IV SOLN: 500.0000 mL | Freq: Once | INTRAVENOUS | Status: DC

## 2022-08-23 MED ORDER — PHENYLEPHRINE 80 MCG/ML (10ML) SYRINGE FOR IV PUSH (FOR BLOOD PRESSURE SUPPORT)
80.0000 ug | PREFILLED_SYRINGE | INTRAVENOUS | Status: AC | PRN
  Administered 2022-08-23 (×3): 80 ug via INTRAVENOUS

## 2022-08-23 MED ORDER — ONDANSETRON HCL 4 MG/2ML IJ SOLN: 4.0000 mg | INTRAMUSCULAR | Status: DC | PRN

## 2022-08-23 MED ORDER — INSULIN ASPART 100 UNIT/ML IJ SOLN: 5.0000 [IU] | Freq: Every day | INTRAMUSCULAR | Status: DC

## 2022-08-23 MED ORDER — SIMETHICONE 80 MG PO CHEW: 80.0000 mg | CHEWABLE_TABLET | ORAL | Status: DC | PRN

## 2022-08-23 MED ORDER — WITCH HAZEL-GLYCERIN EX PADS: 1.0000 | MEDICATED_PAD | CUTANEOUS | Status: DC | PRN

## 2022-08-23 MED ORDER — FENTANYL CITRATE (PF) 100 MCG/2ML IJ SOLN
INTRAMUSCULAR | Status: AC
  Filled 2022-08-23: qty 2

## 2022-08-23 MED ORDER — SENNOSIDES-DOCUSATE SODIUM 8.6-50 MG PO TABS
2.0000 | ORAL_TABLET | Freq: Every day | ORAL | Status: DC
  Administered 2022-08-24: 2 via ORAL
  Filled 2022-08-23: qty 2

## 2022-08-23 MED ORDER — DIPHENHYDRAMINE HCL 25 MG PO CAPS: 25.0000 mg | ORAL_CAPSULE | Freq: Four times a day (QID) | ORAL | Status: DC | PRN

## 2022-08-23 NOTE — Anesthesia Preprocedure Evaluation (Addendum)
Anesthesia Evaluation  Patient identified by MRN, date of birth, ID band Patient awake    Reviewed: Allergy & Precautions, H&P , NPO status , Patient's Chart, lab work & pertinent test results  History of Anesthesia Complications Negative for: history of anesthetic complications  Airway Mallampati: II  TM Distance: >3 FB Neck ROM: full    Dental no notable dental hx. (+) Teeth Intact   Pulmonary neg pulmonary ROS   Pulmonary exam normal breath sounds clear to auscultation       Cardiovascular negative cardio ROS Normal cardiovascular exam Rhythm:regular Rate:Normal     Neuro/Psych negative neurological ROS  negative psych ROS   GI/Hepatic negative GI ROS, Neg liver ROS,,,  Endo/Other  diabetes, Insulin Dependent    Renal/GU negative Renal ROS  negative genitourinary   Musculoskeletal   Abdominal   Peds  Hematology  (+) Blood dyscrasia, anemia   Anesthesia Other Findings   Reproductive/Obstetrics (+) Pregnancy                             Anesthesia Physical Anesthesia Plan  ASA: 3  Anesthesia Plan: Epidural   Post-op Pain Management:    Induction:   PONV Risk Score and Plan:   Airway Management Planned:   Additional Equipment:   Intra-op Plan:   Post-operative Plan:   Informed Consent: I have reviewed the patients History and Physical, chart, labs and discussed the procedure including the risks, benefits and alternatives for the proposed anesthesia with the patient or authorized representative who has indicated his/her understanding and acceptance.       Plan Discussed with:   Anesthesia Plan Comments: (Insulin drip Endotool)        Anesthesia Quick Evaluation

## 2022-08-23 NOTE — Discharge Summary (Signed)
Postpartum Discharge Summary  Date of Service updated***     Patient Name: Theresa Koch DOB: 25-Jun-1980 MRN: [id number]  Date of admission: 08/22/2022 Delivery date:08/23/2022 Delivering provider: Brand Males Date of discharge: 08/23/2022  Admitting diagnosis: Pregnancy Intrauterine pregnancy: [redacted]w[redacted]d     Secondary diagnosis:  Active Problems:   VBAC (vaginal birth after Cesarean)   AMA (advanced maternal age) multigravida 86+, second trimester   History of 2 cesarean sections   Type 1 diabetes mellitus affecting pregnancy in third trimester, antepartum  Additional problems: ***    Discharge diagnosis: Term Pregnancy Delivered and T1DM                                               Post partum procedures:{Postpartum procedures:23558} Augmentation: AROM and Pitocin Complications: None  Hospital course: Induction of Labor With Vaginal Delivery   42 y.o. yo [phone] at [redacted]w[redacted]d was admitted to the hospital 08/22/2022 for induction of labor.  Indication for induction:  Type 1 diabetes .  Patient had an labor course complicated by\ none.  Membrane Rupture Time/Date: 10:43 PM,08/22/2022  Delivery Method:VBAC, Spontaneous Operative Delivery:N/A Episiotomy: None Lacerations:  1st degree;Perineal Details of delivery can be found in separate delivery note.  Patient had a postpartum course complicated by***. Patient is discharged home 08/23/22.  Newborn Data: Birth date:08/23/2022 Birth time:10:25 AM Gender:Female Living status:Living Apgars:9 ,9  Weight:   Magnesium Sulfate received: {Mag received:30440022} BMZ received: No Rhophylac:N/A MMR:N/A T-DaP:{Tdap:23962} Flu: {OVF:64332} Transfusion:{Transfusion received:30440034}  Physical exam  Vitals:   08/23/22 0840 08/23/22 0910 08/23/22 0931 08/23/22 1001  BP:  110/63 109/61 (!) 110/50  Pulse:  70 72 69  Resp:  18  16  Temp: 97.8 F (36.6 C)     TempSrc: Oral     SpO2:      Weight:      Height:        General: {Exam; general:21111117} Lochia: {Desc; appropriate/inappropriate:30686::"appropriate"} Uterine Fundus: {Desc; firm/soft:30687} Incision: {Exam; incision:21111123} DVT Evaluation: {Exam; dvt:2111122} Labs: Lab Results  Component Value Date   WBC 7.8 08/23/2022   HGB 11.0 (L) 08/23/2022   HCT 34.3 (L) 08/23/2022   MCV 80.5 08/23/2022   PLT 104 (L) 08/23/2022      Latest Ref Rng & Units 08/22/2022    2:15 AM  CMP  Glucose 70 - 99 mg/dL 951   BUN 6 - 20 mg/dL 9   Creatinine 8.84 - 1.66 mg/dL 0.63   Sodium 016 - 010 mmol/L 132   Potassium 3.5 - 5.1 mmol/L 3.5   Chloride 98 - 111 mmol/L 106   CO2 22 - 32 mmol/L 16   Calcium 8.9 - 10.3 mg/dL 8.2   Total Protein 6.5 - 8.1 g/dL 5.6   Total Bilirubin 0.3 - 1.2 mg/dL 0.4   Alkaline Phos 38 - 126 U/L 108   AST 15 - 41 U/L 30   ALT 0 - 44 U/L 17    Edinburgh Score:     No data to display           After visit meds:  Allergies as of 08/23/2022       Reactions   Beef-derived Products Other (See Comments)   Cultural request during hospital stay   Chicken Protein    Fish-derived Products    Pork-derived Products    Penicillins Rash  Has patient had a PCN reaction causing immediate rash, facial/tongue/throat swelling, SOB or lightheadedness with hypotension: Yes Has patient had a PCN reaction causing severe rash involving mucus membranes or skin necrosis: No Has patient had a PCN reaction that required hospitalization No Has patient had a PCN reaction occurring within the last 10 years: No If all of the above answers are "NO", then may proceed with Cephalosporin use. Childhood allergy     Med Rec must be completed prior to using this [facility]***        Discharge home in stable condition Infant Feeding: {Baby feeding:23562} Infant Disposition:{CHL IP OB HOME WITH ZOXWRU:04540} Discharge instruction: per After Visit Summary and Postpartum booklet. Activity: Advance as tolerated. Pelvic rest for 6  weeks.  Diet: {OB JWJX:91478295} Future Appointments: Future Appointments  Date Time Provider Department Center  10/14/2022  8:20 AM Carlus Pavlov, MD [facility] None   Follow up Visit:   Please schedule this patient for a In person postpartum visit in 6 weeks with the following provider: Any provider. Additional Postpartum F/U:{PP Procedure:23957}  High risk pregnancy complicated by:  T1DM, AMA, VBAC, Anemia Delivery mode:  VBAC, Spontaneous Anticipated Birth Control:  {Birth Control:23956}   08/23/2022 Brand Males, CNM

## 2022-08-23 NOTE — Progress Notes (Signed)
Patient ID: Theresa Koch, female   DOB: March 06, 1980, 42 y.o.   MRN: [id number]  Ctx much stronger; requesting an epidural  VSS, afebrile FHR 125-130s, +accels, no decels Ctx q 3 mins with Pit at 15mu/min Cx per RN 6+/70/vtx -2  CBGs <120 since 1930  IUP@39 .0wks T1DM Prev C/S x 2 Early active labor  Will get epidural placed Continue to titrate Pit to keep ctx regular Hopeful for VBAC  Arabella Merles [facility] 08/23/2022

## 2022-08-23 NOTE — Anesthesia Procedure Notes (Signed)
Epidural Patient location during procedure: OB Start time: 08/23/2022 3:40 AM End time: 08/23/2022 3:50 AM  Staffing Anesthesiologist: Leonides Grills, MD Performed: anesthesiologist   Preanesthetic Checklist Completed: patient identified, IV checked, site marked, risks and benefits discussed, monitors and equipment checked, pre-op evaluation and timeout performed  Epidural Patient position: sitting Prep: DuraPrep Patient monitoring: heart rate, cardiac monitor, continuous pulse ox and blood pressure Approach: midline Location: L4-L5 Injection technique: LOR air  Needle:  Needle type: Tuohy  Needle gauge: 17 G Needle length: 9 cm Needle insertion depth: 6 cm Catheter type: closed end flexible Catheter size: 19 Gauge Catheter at skin depth: 11 cm Test dose: negative and 1.5% lidocaine with Epi 1:200 K  Assessment Events: blood not aspirated, no cerebrospinal fluid, injection not painful, no injection resistance and negative IV test  Additional Notes Informed consent obtained prior to proceeding including risk of failure, 1% risk of PDPH, risk of minor discomfort and bruising. Discussed alternatives to epidural analgesia and patient desires to proceed.  Timeout performed pre-procedure verifying patient name, procedure, and platelet count.  Patient tolerated procedure well. Reason for block:procedure for pain

## 2022-08-24 ENCOUNTER — Ambulatory Visit (HOSPITAL_COMMUNITY): Payer: Self-pay

## 2022-08-24 LAB — CBC
HCT: 29.8 % — ABNORMAL LOW (ref 36.0–46.0)
Hemoglobin: 9.3 g/dL — ABNORMAL LOW (ref 12.0–15.0)
MCH: 24.6 pg — ABNORMAL LOW (ref 26.0–34.0)
MCHC: 31.2 g/dL (ref 30.0–36.0)
MCV: 78.8 fL — ABNORMAL LOW (ref 80.0–100.0)
Platelets: 112 10*3/uL — ABNORMAL LOW (ref 150–400)
RBC: 3.78 MIL/uL — ABNORMAL LOW (ref 3.87–5.11)
RDW: 25.2 % — ABNORMAL HIGH (ref 11.5–15.5)
WBC: 8.8 10*3/uL (ref 4.0–10.5)
nRBC: 0 % (ref 0.0–0.2)

## 2022-08-24 MED ORDER — SENNOSIDES-DOCUSATE SODIUM 8.6-50 MG PO TABS: 2.0000 | ORAL_TABLET | Freq: Every day | ORAL | 1 refills | Status: AC

## 2022-08-24 MED ORDER — FERROUS SULFATE 325 (65 FE) MG PO TBEC: 325.0000 mg | DELAYED_RELEASE_TABLET | ORAL | 1 refills | Status: AC

## 2022-08-24 MED ORDER — ACETAMINOPHEN 325 MG PO TABS: 650.0000 mg | ORAL_TABLET | ORAL | 1 refills | Status: AC | PRN

## 2022-08-24 MED ORDER — IBUPROFEN 600 MG PO TABS: 600.0000 mg | ORAL_TABLET | Freq: Four times a day (QID) | ORAL | 1 refills | Status: AC

## 2022-08-24 NOTE — Anesthesia Postprocedure Evaluation (Signed)
Anesthesia Post Note  Patient: Theresa Koch  Procedure(s) Performed: AN AD HOC LABOR EPIDURAL     Patient location during evaluation: Mother Baby Anesthesia Type: Epidural Level of consciousness: awake, oriented and awake and alert Pain management: pain level controlled Vital Signs Assessment: post-procedure vital signs reviewed and stable Respiratory status: spontaneous breathing, respiratory function stable and nonlabored ventilation Cardiovascular status: stable Postop Assessment: adequate PO intake, able to ambulate, patient able to bend at knees, no apparent nausea or vomiting and no headache Anesthetic complications: no   No notable events documented.  Last Vitals:  Vitals:   08/23/22 2356 08/24/22 0416  BP: 106/62 (!) 94/50  Pulse: 72 (!) 54  Resp: 18 18  Temp: 36.9 C 36.7 C  SpO2:      Last Pain:  Vitals:   08/24/22 0830  TempSrc:   PainSc: 0-No pain   Pain Goal: Patients Stated Pain Goal: 0 (08/22/22 0145)                 Mackson Botz

## 2022-08-24 NOTE — Lactation Note (Signed)
This note was copied from a baby's chart. Lactation Consultation Note Experienced BF mom BF her other 3 children 2 yrs each. Mom is diabetic. Mom stated she doesn't feel any milk in her breast yet. Mom had a room full of visitors. Mom stated the baby is latching and BF sometimes up to 30 minutes. Then she gives the formula because she doesn't have milk yet. Discussed colostrum and mature milk. Mentioned to mom that she may not even need to give the formula after that long of BF. Mom stated she would feel more comfortable doing that until her mature milk comes in. Mom encouraged to feed baby 8-12 times/24 hours and with feeding cues.  Encouraged mom to call for assistance as needed or questions. Mom feels good about the feedings.  Patient Name: Theresa Koch [id number] Date: 08/24/2022 Age:71 hours Reason for consult: Initial assessment;Term;Maternal endocrine disorder   Maternal Data Does the patient have breastfeeding experience prior to this delivery?: Yes How long did the patient breastfeed?: 2 yrs each  Feeding Mother's Current Feeding Choice: Breast Milk and Formula Nipple Type: Slow - flow  LATCH Score                    Lactation Tools Discussed/Used    Interventions Interventions: Breast feeding basics reviewed;LC Services brochure  Discharge    Consult Status Consult Status: Complete    Jovanka Westgate G 08/24/2022, 8:14 PM

## 2022-08-24 NOTE — Progress Notes (Signed)
Patient's blood sugar at bedtime was 188.

## 2022-08-24 NOTE — Progress Notes (Signed)
CSW received consult stating MOB is in need of a car seat. CSW met with MOB at bedside to assess for needs. When CSW entered room, a visitor was present asleep. MOB was observed sitting in hospital bed nursing infant. CSW introduced self and explained reason for consult. MOB confirmed she is in need of a car seat and is not able to get assistance from family or friends. CSW inquired about additional baby essentials. MOB reports she has a pack n play at home, some diapers and wipes, and clothing. CSW provided MOB with [employer] resource for additional diapers/wipes. MOB reports she was receiving WIC but missed a breast feeding class and her benefits stopped. MOB reports she has contact information for [facility] and declined a referral at this time.   CSW explained that the hospital can provide a car seat as a one time option for $30 cash. MOB reports she will ask FOB for the money. CSW agreed to return. CSW returned to room, MOB provided $30 cash. Car seat was provided. MOB denied additional resource needs. No barriers to infant's discharge.   Signed,  Norberto Sorenson, MSW, LCSWA, LCASA 08/24/2022 12:42 PM

## 2022-08-24 NOTE — Progress Notes (Addendum)
The Rn called MD just to inform her that a CBC was not ordered this morning.  MOB 's platelets were around a 100 yesterday.  Marland Kitchen

## 2022-08-24 NOTE — Plan of Care (Signed)
  Problem: Education: Goal: Knowledge of condition will improve Outcome: Completed/Met

## 2022-09-14 ENCOUNTER — Other Ambulatory Visit: Payer: Self-pay | Admitting: Internal Medicine

## 2022-09-14 ENCOUNTER — Other Ambulatory Visit: Payer: Self-pay | Admitting: Advanced Practice Midwife

## 2022-09-14 DIAGNOSIS — E1065 Type 1 diabetes mellitus with hyperglycemia: Secondary | ICD-10-CM

## 2022-09-14 DIAGNOSIS — O0990 Supervision of high risk pregnancy, unspecified, unspecified trimester: Secondary | ICD-10-CM

## 2022-09-14 DIAGNOSIS — O24012 Pre-existing diabetes mellitus, type 1, in pregnancy, second trimester: Secondary | ICD-10-CM

## 2022-09-16 ENCOUNTER — Other Ambulatory Visit: Payer: Self-pay | Admitting: Internal Medicine

## 2022-09-16 DIAGNOSIS — E1065 Type 1 diabetes mellitus with hyperglycemia: Secondary | ICD-10-CM

## 2022-09-22 ENCOUNTER — Telehealth (HOSPITAL_COMMUNITY): Payer: Self-pay | Admitting: *Deleted

## 2022-09-22 NOTE — Telephone Encounter (Signed)
09/22/2022  Name: Theresa Koch MRN: [id number] DOB: 07/13/1980  Reason for Call:  Transition of Care Hospital Discharge Call  Contact Status: Patient Contact Status: Message  Language assistant needed:          Follow-Up Questions:    Inocente Salles Postnatal Depression Scale:  In the Past 7 Days:    PHQ2-9 Depression Scale:     Discharge Follow-up:    Post-discharge interventions: NA  Salena Saner, RN 09/22/2022 14:34

## 2022-10-06 ENCOUNTER — Ambulatory Visit: Payer: Medicaid Other | Admitting: Obstetrics & Gynecology

## 2022-10-14 ENCOUNTER — Telehealth: Payer: Self-pay | Admitting: Internal Medicine

## 2022-10-14 ENCOUNTER — Encounter: Payer: Self-pay | Admitting: Internal Medicine

## 2022-10-14 ENCOUNTER — Ambulatory Visit (INDEPENDENT_AMBULATORY_CARE_PROVIDER_SITE_OTHER): Payer: Medicaid Other | Admitting: Internal Medicine

## 2022-10-14 ENCOUNTER — Telehealth: Payer: Self-pay

## 2022-10-14 ENCOUNTER — Other Ambulatory Visit (HOSPITAL_COMMUNITY): Payer: Self-pay

## 2022-10-14 VITALS — BP 120/70 | HR 88 | Ht 65.0 in | Wt 168.2 lb

## 2022-10-14 DIAGNOSIS — E1065 Type 1 diabetes mellitus with hyperglycemia: Secondary | ICD-10-CM | POA: Diagnosis not present

## 2022-10-14 DIAGNOSIS — E7800 Pure hypercholesterolemia, unspecified: Secondary | ICD-10-CM

## 2022-10-14 LAB — LIPID PANEL
Cholesterol: 178 mg/dL (ref 0–200)
HDL: 60.8 mg/dL (ref >39.00)
LDL Cholesterol: 99 mg/dL (ref 0–99)
NonHDL: 117
Total CHOL/HDL Ratio: 3
Triglycerides: 90 mg/dL (ref 0.0–149.0)
VLDL: 18 mg/dL (ref 0.0–40.0)

## 2022-10-14 LAB — MICROALBUMIN / CREATININE URINE RATIO
Creatinine,U: 51.3 mg/dL
Microalb Creat Ratio: 1.4 mg/g (ref 0.0–30.0)
Microalb, Ur: <0.7 mg/dL (ref 0.0–1.9)

## 2022-10-14 LAB — POCT GLYCOSYLATED HEMOGLOBIN (HGB A1C): Hemoglobin A1C: 7 % — AB (ref 4.0–5.6)

## 2022-10-14 LAB — TSH: TSH: 2.97 u[IU]/mL (ref 0.35–5.50)

## 2022-10-14 MED ORDER — GLUCAGON 3 MG/DOSE NA POWD: 3.0000 mg | Freq: Once | NASAL | 11 refills | Status: AC | PRN

## 2022-10-14 NOTE — Patient Instructions (Addendum)
Please use the following regimen: - Basaglar 6-8 units daily (may need to split the dose if sugars start increasing overnight - 3-4 in am and 5 at night) - Humalog:  B and L: 5-6 units  D: 3-4 units   Please return in 3-4 months.

## 2022-10-14 NOTE — Telephone Encounter (Signed)
Pharmacy Patient Advocate Encounter   Received notification from CoverMyMeds that prior authorization for Freestyle libre 2 is required/requested.   Insurance verification completed.   The patient is insured through [employer] .   Per test claim: PA required; PA started via CoverMyMeds. KEY [id number] . Waiting for clinical questions to populate.

## 2022-10-14 NOTE — Telephone Encounter (Signed)
Patient states that while she was in the office today that she received a phone call from the pharmacy stating that Glucagon needs a prior authorization.

## 2022-10-14 NOTE — Telephone Encounter (Signed)
Pt needs PA for Glucagon

## 2022-10-14 NOTE — Progress Notes (Signed)
Patient ID: Theresa Koch, female   DOB: [birth year], 42 y.o.   MRN: [id number]  HPI: Theresa Koch is a 42 y.o.-year-old female, initially referred by Dr. Thornton Papas, returning for f/u for DM1, dx in 2011, but at DM1 in 02/2015, insulin-dependent since 2013, uncontrolled, without long term complications. Last visit 3 months ago.  She is again late for her appointment (20 minutes).  Interim history: Patient gave birth on 08/23/2022 to a healthy baby girl (Apgar 9, 9).  She has 3 other girls. She is b'feeding.  She complains of many low blood sugars. No increased urination, blurry vision, nausea. She has anemia. Hemoglobin was 7.6 (!) on 05/26/2022, but improved afterwards, the latest value being 9.3 08/24/2022. She is on iron supplements and prenatal vitamins, but was taken inconsistently.   Reviewed HbA1c levels: Lab Results  Component Value Date   HGBA1C 6.2 (A) 06/30/2022   HGBA1C 7.3 (H) 02/24/2022   HGBA1C 7.1 (A) 11/19/2021   HGBA1C 7.8 (A) 07/24/2021   HGBA1C 8.2 (A) 03/15/2021   HGBA1C 7.7 (A) 11/02/2020   HGBA1C 7.9 (A) 05/31/2020   HGBA1C 8.1 (A) 11/25/2019   HGBA1C 8.0 (A) 01/11/2019   HGBA1C 8.8 (A) 03/22/2018   HGBA1C 7.9 (A) 09/04/2017   HGBA1C 7.8 04/10/2017   HGBA1C 6.0 03/10/2016   HGBA1C 5.6 12/14/2015   HGBA1C 7.2 09/14/2015   HGBA1C 7.1 05/23/2015   HGBA1C 8.1 (H) 02/09/2015   HGBA1C 6.5 08/29/2014   HGBA1C 6.2 05/12/2014   HGBA1C 7.6 (H) 02/14/2014  06/20/2022: HbA1c 6.2%  She is on:  - Basaglar 7 units in am and 3-4 units at bedtime >> 16 units in am and 0-4 units at night >> 16 >> 10 units in am - Humalog:  4-5 >> 6-8 >> 6-7 units 15-30 before tea in am  6-7 >> 8-10 (12) >> 6-7 units before lunch 2-3 >> 4 >> 5 units before dinner  At night, do not use more than 1-2 units NovoLog for correction. When you correct a low, do not use more then 15-30 g carbs.  We stopped Metformin XR 500 mg 3x a day with meals. We stopped Glipizide XL 5  mg in am.  Pt.checks her sugar is more than 4 times a day with her libre CGM:  Previously:  Previously:   Lowest sugar was 40s at night >> 50s >> 50s >> 49 ; she has hypoglycemia awareness in the 60s. Highest sugar was 200s >> 300s >> 200s >> 400 (forgot to bolus)  Glucometer: One Touch Ultra  Pt's meals are: - Breakfast: tea with milk + cookies >> tea, waffle - Lunch: salad, soup, cheese  - Dinner: egg, cheese, meat, sometimes cereals  - after dinner: milk >> stopped   No CKD; latest BUN/creatinine: Lab Results  Component Value Date   BUN 9 08/22/2022   Lab Results  Component Value Date   CREATININE 0.70 08/22/2022   No MAU: Lab Results  Component Value Date   MICRALBCREAT 1.0 11/19/2021   MICRALBCREAT 1.6 11/02/2020   MICRALBCREAT 1.1 11/25/2019   MICRALBCREAT 1.1 01/11/2019   MICRALBCREAT 0.9 04/10/2017   MICRALBCREAT 0.7 05/23/2015  She is not on ACE inhibitor/ARB.  + Mild HL: Lab Results  Component Value Date   CHOL 146 11/19/2021   HDL 50.10 11/19/2021   LDLCALC 84 11/19/2021   TRIG 58.0 11/19/2021   CHOLHDL 3 11/19/2021  She is not on a statin.  - last eye exam was in 01/2021: No  DR reportedly. Coming up this mo.  - no numbness and tingling in her feet.  Foot exam performed 03/26/2022.  Latest TSH was reviewed and this was normal: Lab Results  Component Value Date   TSH 1.73 11/19/2021   ROS: + see HPI  I reviewed pt's medications, allergies, PMH, social hx, family hx, and changes were documented in the history of present illness. Otherwise, unchanged from my initial visit note.  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   Previous cesarean section 10/30/2014   TOLAC successful 2016    Pure hypercholesterolemia 03/15/2021   Seasonal allergies    Type 1 diabetes mellitus with hyperglycemia, with long-term current use of insulin (HCC) 05/23/2015    Past Surgical History:  Procedure Laterality Date   CESAREAN SECTION     CESAREAN SECTION N/A 05/09/2016   Procedure: CESAREAN SECTION;  Surgeon: Carrington Clamp, MD;  Location: [facility] BIRTHING SUITES;  Service: Obstetrics;  Laterality: N/A;   History   Social History   Marital Status: Married    Spouse Name: N/A    Number of Children: 1   Occupational History   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 DX CODE: W09.811 (Patient not taking: Reported on 08/19/2022) 200 each 3   acetaminophen (TYLENOL) 325 MG tablet Take 2 tablets (650 mg total) by mouth every 4 (four) hours as needed (for pain scale < 4). 100 tablet 1   BD PEN NEEDLE NANO 2ND GEN 32G X 4 MM MISC USE AS DIRECTED FIVE TIMES DAILY 400 each 3   Blood Glucose Monitoring Suppl (ACCU-CHEK GUIDE) w/Device KIT 1 Device by Does not apply route as needed. 1 kit 2   Continuous Blood Gluc Receiver (FREESTYLE LIBRE 2 READER) DEVI 1 EACH BY DOES NOT APPLY ROUTE ONCE FOR 1 DOSE. (Patient not taking: Reported on 08/19/2022) 1 each 0   Continuous Glucose Sensor (FREESTYLE LIBRE 2 SENSOR) MISC CHANGE EVERY 14 DAYS 6 each 3   ferrous sulfate 325 (65 FE) MG EC tablet Take 1 tablet (325 mg total) by mouth every other day. 45 tablet 1   Glucagon 3 MG/DOSE POWD Place 3 mg into the nose once as needed for up to 1 dose. 1 each 11   glucose blood test strip Use as instructed 100 each 12   ibuprofen (ADVIL) 600 MG tablet Take 1 tablet (600 mg total) by mouth every 6 (six) hours. 100 tablet 1   Insulin Glargine (BASAGLAR KWIKPEN) 100 UNIT/ML Inject 10-20 Units into the skin daily. 30 mL 3   insulin lispro (HUMALOG KWIKPEN) 100 UNIT/ML  KwikPen INJECT UP TO 20 UNITS INTO THE SKIN DAILY AS ADVISED. 30 mL 3   prenatal vitamin w/FE, FA (PRENATAL 1 + 1) 27-1 MG TABS tablet Take 1 tablet by mouth daily at 12 noon. 30 tablet 12   senna-docusate (SENOKOT-S) 8.6-50 MG tablet Take 2 tablets by mouth daily. 60 tablet 1   No current facility-administered medications on file prior to visit.   Allergies  Allergen Reactions   Pork-Derived Products  Penicillins Rash    Has patient had a PCN reaction causing immediate rash, facial/tongue/throat swelling, SOB or lightheadedness with hypotension: Yes Has patient had a PCN reaction causing severe rash involving mucus membranes or skin necrosis: No Has patient had a PCN reaction that required hospitalization No Has patient had a PCN reaction occurring within the last 10 years: No If all of the above answers are "NO", then may proceed with Cephalosporin use. Childhood allergy     Family History  Problem Relation Age of Onset   Hypertension Mother    Diabetes Father    Kidney disease Father    Diabetes Sister    Diabetes Brother    Breast cancer Neg Hx    PE: LMP 11/15/2021 (Exact Date) Comment: Irreg due to IUD removal Wt Readings from Last 3 Encounters:  08/22/22 176 lb (79.8 kg)  08/19/22 176 lb 1.6 oz (79.9 kg)  08/07/22 174 lb 11.2 oz (79.2 kg)   Constitutional: Normal weight, in NAD Eyes: EOMI, no exophthalmos ENT: no thyromegaly, no cervical lymphadenopathy Cardiovascular: RRR, No MRG Respiratory: CTA B Musculoskeletal: no deformities Skin: no rashes Neurological: no tremor with outstretched hands  ASSESSMENT: 1. DM1, uncontrolled, without long term complications, but with hypo-/hyperglycemia  We confirmed DM1: Component     Latest Ref Rng 02/09/2015  C-Peptide     0.80 - 3.90 ng/mL 0.24 (L)  Glucose, Fasting     65 - 99 mg/dL 64 (L)  Glutamic Acid Decarb Ab     <5 IU/mL 42 (H)  Pancreatic Islet Cell Antibody     < 5 JDF Units <5   2. HL  PLAN:  1.  Patient with uncontrolled type 1 diabetes, on basal-bolus insulin regimen, returning for the first visit postpartum.  At last visit, sugars were improved, fluctuating within the target range but with slightly higher blood sugars after meals.  These were still at goal, except for occasional higher blood sugars after breakfast.  Upon questioning, she was eating cakes and other sweets with her tea in the morning and we discussed about adding 2 units of Humalog depending on the amount of sweets she was not eating.  She was trying to cut down on these.  Since last visit, she gave birth to a healthy baby girl on 08/23/2022.  I did advise her at last visit about how to change her insulin doses after pregnancy.  Latest HbA1c was 6.2%, decreased, 3 months ago. CGM interpretation: -At today's visit, we reviewed her CGM downloads: It appears that 58% of values are in target range (goal >70%), while 18% are higher than 180 (goal <25%), and 23% are lower than 70 (goal <4%).  The calculated average blood sugar is 126.  The projected HbA1c for the next 3 months (GMI) is 6.3%. -Reviewing the CGM trends, sugars appear to be fluctuating within the target range but with many lows throughout the day and night and also occasional hyperglycemic spikes.  She feels that her sugars are constantly low, mostly in the 50s, even after large meals.  Upon questioning, she is using slightly higher doses of Humalog than recommended at last visit. At today's visit we will reduce both her Basaglar and Humalog doses.  We did discuss that for such a low dose of Basaglar, she may need to split the dose in 2 if the sugars start increasing overnight.  She agrees with this plan. - I suggested to:  Patient Instructions  Please use the following regimen: - Basaglar 6-8 units daily (  may need to split the dose if sugars start increasing overnight - 3-4 in am and 5 at night) - Humalog:  B and L: 5-6 units  D: 3-4 units   Please return in 3-4  months.  - we checked her HbA1c: 7.0% (higher) - advised to check sugars at different times of the day - 4x a day, rotating check times - advised for yearly eye exams >> she is not UTD but has an appointment coming up - will check annual labs today - return to clinic in 3-4 months   2. HL -Lipid fractions were at goal in 11/2021: Lab Results  Component Value Date   CHOL 146 11/19/2021   HDL 50.10 11/19/2021   LDLCALC 84 11/19/2021   TRIG 58.0 11/19/2021   CHOLHDL 3 11/19/2021  -She is not on a statin -Will check a lipid panel today-nonfasting  Component     Latest Ref Rng 10/14/2022  Hemoglobin A1C     4.0 - 5.6 % 7.0 !   Cholesterol     0 - 200 mg/dL 161   Triglycerides     0.0 - 149.0 mg/dL 09.6   HDL Cholesterol     >39.00 mg/dL 04.54   VLDL     0.0 - 40.0 mg/dL 09.8   LDL (calc)     0 - 99 mg/dL 99   Total CHOL/HDL Ratio 3   NonHDL 117.00   TSH     0.35 - 5.50 uIU/mL 2.97   Microalb, Ur     0.0 - 1.9 mg/dL <1.1   Creatinine,U     mg/dL 91.4   MICROALB/CREAT RATIO     0.0 - 30.0 mg/g 1.4   Labs are at goal.  Carlus Pavlov, MD PhD [facility]

## 2022-10-16 ENCOUNTER — Other Ambulatory Visit (HOSPITAL_COMMUNITY): Payer: Self-pay

## 2022-10-17 ENCOUNTER — Telehealth: Payer: Self-pay

## 2022-10-17 NOTE — Telephone Encounter (Signed)
Pt is wanting to know the status of this PA. She is currently out.

## 2022-10-17 NOTE — Telephone Encounter (Signed)
Pt sensor is running out and we are currently waiting on a PA for her Theresa Koch 2 So I have placed the [place] 3 sensor up front with her name on it.   Sample  Device: Freestyle Libre 3  Quantity:1 ZOX:[id number] EXP:01/06/23  Dicie Beam

## 2022-10-20 ENCOUNTER — Other Ambulatory Visit (HOSPITAL_COMMUNITY): Payer: Self-pay

## 2022-10-20 NOTE — Telephone Encounter (Addendum)
Spoke with pt via phone on Friday 10/17/22 Message sent to PA team High Priority as well on 10/17/22 and another one today 10/20/22

## 2022-10-20 NOTE — Telephone Encounter (Signed)
Status on PA?

## 2022-10-20 NOTE — Telephone Encounter (Signed)
Clinical info including chart notes and labs have been submitted and marked for expedited review

## 2022-10-21 NOTE — Telephone Encounter (Signed)
Pharmacy Patient Advocate Encounter  Received notification from [facility] that Prior Authorization for [facility] 2 sensor has been APPROVED through 10/20/2023

## 2022-10-27 ENCOUNTER — Ambulatory Visit: Payer: Medicaid Other | Admitting: Obstetrics and Gynecology

## 2022-11-04 ENCOUNTER — Other Ambulatory Visit: Payer: Self-pay

## 2022-11-04 ENCOUNTER — Encounter: Payer: Self-pay | Admitting: Student

## 2022-11-04 ENCOUNTER — Ambulatory Visit: Payer: Medicaid Other | Admitting: Student

## 2022-11-04 DIAGNOSIS — M545 Low back pain, unspecified: Secondary | ICD-10-CM

## 2022-11-04 DIAGNOSIS — E1065 Type 1 diabetes mellitus with hyperglycemia: Secondary | ICD-10-CM | POA: Diagnosis not present

## 2022-11-04 DIAGNOSIS — N3941 Urge incontinence: Secondary | ICD-10-CM | POA: Diagnosis not present

## 2022-11-04 DIAGNOSIS — Z30014 Encounter for initial prescription of intrauterine contraceptive device: Secondary | ICD-10-CM

## 2022-11-04 DIAGNOSIS — G8929 Other chronic pain: Secondary | ICD-10-CM

## 2022-11-04 DIAGNOSIS — N644 Mastodynia: Secondary | ICD-10-CM

## 2022-11-04 MED ORDER — ACCU-CHEK GUIDE VI STRP: ORAL_STRIP | 11 refills | Status: DC

## 2022-11-04 MED ORDER — ACCU-CHEK SOFTCLIX LANCETS MISC
11 refills | Status: AC
Start: 2022-11-04 — End: ?

## 2022-11-05 MED ORDER — LIDOCAINE 5 % EX PTCH: 1.0000 | MEDICATED_PATCH | CUTANEOUS | 1 refills | Status: AC

## 2022-11-05 NOTE — Progress Notes (Signed)
Post Partum Visit Note  Theresa Koch is a 42 y.o. [phone] female who presents for a postpartum visit. She is 10 weeks postpartum following a successful VBAC.  I have fully reviewed the prenatal and intrapartum course. The delivery was at 39 gestational weeks.  Anesthesia: epidural. Postpartum course has been uncomplicated. Baby is doing well. Baby is feeding by breast. Bleeding no bleeding. Bowel function is normal. Bladder function is abnormal: with mild incontinence . Patient is not sexually active. Contraception method is none. Postpartum depression screening: negative.  Patient reports concerns related to her back pain, urinary incontinence, and breast pain. Patient describes her back pain as a sharp pain in her lower back that onsets when bending over for long periods of time or repositioning her body. Has been using lidocaine patches and finding some relief. She has also noticed a decrease in control over her bladder. States that if she does not immediately go to the restroom when she has the urge, she will leak urine on herself. This is a newer symptom since delivery.   Hoa is also worried about continued breast pain. Patient first noticed breast pain around 2021. Pain started initially on her right breast only and now is bilateral. Pain is intermittent and not related to cycles. Denies pain stimulated by cold touch or breastfeeding. Patient is also currently lactating. Had a diagnostic mammogram in 2021 that was normal. Patient desires a Paraguard IUD, but is concerned that this is the reason her breast pain first started. Patient is concerned that she has an elevated level of copper in her system. She would like to explore her breast pain and its relation to Copper IUDs before proceeding.   The pregnancy intention screening data noted above was reviewed. Potential methods of contraception were discussed. The patient elected to proceed with No data recorded.   Edinburgh Postnatal  Depression Scale - 11/04/22 1609       Edinburgh Postnatal Depression Scale:  In the Past 7 Days   I have been able to laugh and see the funny side of things. 0    I have looked forward with enjoyment to things. 0    I have blamed myself unnecessarily when things went wrong. 0    I have been anxious or worried for no good reason. 0    I have felt scared or panicky for no good reason. 0    Things have been getting on top of me. 0    I have been so unhappy that I have had difficulty sleeping. 0    I have felt sad or miserable. 0    I have been so unhappy that I have been crying. 0    The thought of harming myself has occurred to me. 0    Edinburgh Postnatal Depression Scale Total 0             Health Maintenance Due  Topic Date Due   OPHTHALMOLOGY EXAM  03/20/2015   Cervical Cancer Screening (HPV/Pap Cotest)  10/03/2018   INFLUENZA VACCINE  08/07/2022   COVID-19 Vaccine (1 - 2023-24 season) Never done    The following portions of the patient's history were reviewed and updated as appropriate: allergies, current medications, past family history, past medical history, past social history, past surgical history, and problem list.  Review of Systems A comprehensive review of systems was negative except for: Genitourinary: positive for urine leakage and urinary incontinence Integument/breast: positive for breast tenderness and pain  Musculoskeletal: positive  for back pain  Objective:  BP 108/72   Pulse 67   Wt 166 lb 11.2 oz (75.6 kg)   LMP 11/15/2021 (Exact Date) Comment: Irreg due to IUD removal  Breastfeeding Yes   BMI 27.74 kg/m    General:  cooperative, appears stated age, and fatigued   Breasts:  abnormal tenderness present in RUQ of right breast and medial lower quadrant of left breast; no lumps, nodules, lesions, skin changes, or peau d'orange; nipples erect bilaterally; milk easily expressible  Lungs: clear to auscultation bilaterally  Heart:  regular rate and  rhythm  Abdomen: soft, non-tender; bowel sounds normal; no masses,  no organomegaly   Wound Not present  GU exam:  not indicated       Assessment:   1. Encounter for postpartum care of lactating mother - Ambulatory referral to Physical Therapy - lidocaine (LIDODERM) 5 %; Place 1 patch onto the skin daily. Remove & Discard patch within 12 hours or as directed by MD  Dispense: 30 patch; Refill: 1  2. Type 1 diabetes mellitus with hyperglycemia, with long-term current use of insulin (HCC) - glucose blood (ACCU-CHEK GUIDE) test strip; Use as instructed; check blood glucose 4 times daily  Dispense: 100 each; Refill: 11 - Accu-Chek Softclix Lancets lancets; Use as instructed; check blood glucose 4 times daily  Dispense: 100 each; Refill: 11  3. Urge incontinence of urine - Ambulatory referral to Physical Therapy  4. Bilateral low back pain without sciatica, unspecified chronicity - Ambulatory referral to Physical Therapy - lidocaine (LIDODERM) 5 %; Place 1 patch onto the skin daily. Remove & Discard patch within 12 hours or as directed by MD  Dispense: 30 patch; Refill: 1  5. Chronic breast pain - Copper, Serum; Future - [place] LIMITED ULTRASOUND INCLUDING AXILLA LEFT BREAST ; Future - [place] LIMITED ULTRASOUND INCLUDING AXILLA RIGHT BREAST; Future  6. Evaluation for intrauterine contraception - Copper, Serum; Future   Plan:   Essential components of care per ACOG recommendations:  1.  Mood and well being: Patient with negative depression screening today. Reviewed local resources for support.  - Patient tobacco use? No.   - hx of drug use? No.    2. Infant care and feeding:  -Patient currently breastmilk feeding? Yes. Reviewed importance of draining breast regularly to support lactation.  -Social determinants of health (SDOH) reviewed in EPIC. No concerns.  3. Sexuality, contraception and birth spacing - Patient does not want a pregnancy in the next year.  Desired family size is 4  children.  - Reviewed reproductive life planning. Reviewed contraceptive methods based on pt preferences and effectiveness.  Patient expressed desire for IUD or IUS . She prefers a non-hormonal method. Addressed patient's concerns related to side effects of copper iud. Discussed that IUD has been studied and manufactured to only secrete Copper locally within the uterus and rarely impacts systemic copper levels without other co-morbidities.  - Discussed birth spacing of 18 months  4. Sleep and fatigue -Encouraged family/partner/community support of 4 hrs of uninterrupted sleep to help with mood and fatigue  5. Physical Recovery  - Discussed patients delivery and complications. She describes her labor as good. - Patient had a Vaginal, no problems at delivery. Patient had a 1st degree laceration. Perineal healing reviewed. Patient expressed understanding - Patient has urinary incontinence? Yes. Discussed role of pelvic floor PT. Offered PT and patient accepted. Patient was referred to pelvic floor PT.  Discussed the relation of back pain and urinary incontinence with pelvic floor  weakness. Discussed that this is common after pregnancies and can be supported with Kegels and Pelvic Floor Therapy. Patient will pursue as desired.  - Patient is safe to resume physical and sexual activity  6.  Health Maintenance - HM due items addressed Yes - Last pap smear No results found for: "DIAGPAP" Pap smear not done at today's visit. Patient would prefer to have pap smear done with IUD placement -Breast Cancer screening indicated? Yes- Patient is refusing mammogram testing at this time. Discussed in depth the limitations of other imaging modalities and the recommendation for mammogram for reassurance of low breast cancer risk. Normal breast exam today without any lumps or nodules. Discussed low-risk for malignancy due to bilateral presentation of tenderness, however, cannot be 100% certain without further follow-up  and monitoring.   7. Chronic Disease/Pregnancy Condition follow up:  back pain and urinary incontinence  with GYN office  - PCP follow up, Endocrinology follow up, and GYN follow up  Corlis Hove, NP [facility], [facility]

## 2022-11-10 ENCOUNTER — Other Ambulatory Visit: Payer: Self-pay

## 2022-11-10 ENCOUNTER — Other Ambulatory Visit: Payer: Medicaid Other

## 2022-11-10 DIAGNOSIS — Z30014 Encounter for initial prescription of intrauterine contraceptive device: Secondary | ICD-10-CM

## 2022-11-10 DIAGNOSIS — G8929 Other chronic pain: Secondary | ICD-10-CM

## 2023-02-03 ENCOUNTER — Ambulatory Visit: Payer: Medicaid Other

## 2023-02-03 NOTE — Therapy (Incomplete)
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   Previous cesarean section 10/30/2014   TOLAC successful 2016    Pure hypercholesterolemia 03/15/2021   Seasonal allergies    Supervision of high risk pregnancy, antepartum 02/04/2022              Nursing Staff    Provider      Office Location    [facility]    Dating     08/22/2022, by Last Menstrual Period      [facility] Model    [x]  Traditional  [ ]  Centering  [ ]  Mom-Baby Dyad                Language     English    Anatomy US     Normal, but incomplete > FU Scheduled   Fetal echo scheduled       Flu Vaccine     Declined-02/04/22    Genetic/Carrier Screen     NIPS: LR female     Type 1 diabetes mellitus with hyperglycemia, with long-term current use of insulin (HCC) 05/23/2015   Past Surgical History:  Procedure Laterality Date   CESAREAN SECTION     CESAREAN SECTION N/A 05/09/2016   Procedure: CESAREAN SECTION;  Surgeon: Carrington Clamp, MD;  Location: [facility] BIRTHING SUITES;  Service: Obstetrics;  Laterality: N/A;   Patient Active Problem List   Diagnosis Date Noted   History of VBAC 07/14/2022   Anemia affecting pregnancy 06/09/2022   History of 2 cesarean sections 02/25/2022   Type 1 diabetes (HCC) 02/24/2022    PCP: NA  REFERRING PROVIDER: Corlis Hove, NP   REFERRING DIAG: Z39.2 (ICD-10-CM) - Encounter for routine postpartum follow-up N39.41 (ICD-10-CM) - Urge incontinence of urine M54.50 (ICD-10-CM) - Bilateral low back pain without sciatica, unspecified chronicity   THERAPY DIAG:  No diagnosis found.  Rationale for Evaluation and Treatment: Rehabilitation  ONSET DATE: ***  SUBJECTIVE:                                                                                                                                                                                            SUBJECTIVE STATEMENT: Pt is about 5.5 months postpartum after VBAC, 4th delivery. She is currently breast feeding. She is having mild incontinence and urgency.  Fluid intake: {Yes/No:304960894}   PAIN:  Are you having pain? {yes/no:20286} NPRS scale: ***/10  Pain location: {pelvic pain location:27098}  Pain type: {type:313116} Pain description: {PAIN DESCRIPTION:21022940}   Aggravating factors: *** Relieving factors: lidocaine patches  PRECAUTIONS: None  RED FLAGS: None   WEIGHT BEARING RESTRICTIONS: No  FALLS:  Has patient fallen in last 6 months? No  LIVING ENVIRONMENT: Lives with: {[facility] lives with:25569::"lives with their family"} Lives in: {Lives in:[address]70}  OCCUPATION: ***  PLOF: Independent  PATIENT GOALS: ***  PERTINENT HISTORY:  C-section x 2, VBAC, diabetes mellitus type I, circumcision,  Sexual abuse: {Yes/No:304960894}  BOWEL MOVEMENT: Pain with bowel movement: {yes/no:20286} Type of bowel movement:{PT BM type:27100} Fully empty rectum: {Yes/No:304960894} Leakage: {Yes/No:304960894} Pads: {Yes/No:304960894} Fiber supplement: {Yes/No:304960894}  URINATION: Pain with urination: {yes/no:20286} Fully empty bladder: Yes: - Stream: {PT urination:27102} Urgency: {Yes/No:304960894} Frequency: *** Leakage: {PT leakage:27103} Pads: {Yes/No:304960894}  INTERCOURSE: Pain with intercourse: {pain with intercourse PA:27099} Ability to have vaginal penetration:  {Yes/No:304960894} Climax: *** Marinoff Scale: ***/3  PREGNANCY: Vaginal deliveries *** Tearing {Yes***/No:304960894} C-section deliveries *** Currently pregnant {Yes***/No:304960894}  PROLAPSE: {PT prolapse:27101}   OBJECTIVE:  Note: Objective measures were completed at Evaluation unless otherwise noted.  02/03/23:  COGNITION: Overall cognitive status: Within  functional limits for tasks assessed     SENSATION: Light touch: Appears intact Proprioception: Appears intact   FUNCTIONAL TESTS:  Squat: Single leg stance:  Rt:  Lt: Curl-up test:  GAIT: Comments: ***  POSTURE: {posture:25561}  PELVIC ALIGNMENT:  LUMBARAROM/PROM:  A/PROM A/PROM  Eval (% available)  Flexion   Extension   Right lateral flexion   Left lateral flexion   Right rotation   Left rotation    (Blank rows = not tested)  PALPATION:   General  ***                External Perineal Exam ***                             Internal Pelvic Floor ***  Patient confirms identification and approves PT to assess internal pelvic floor and treatment {yes/no:20286}  PELVIC MMT:   MMT eval  Vaginal   Internal Anal Sphincter   External Anal Sphincter   Puborectalis   Diastasis Recti   (Blank rows = not tested)        TONE: ***  PROLAPSE: ***  TODAY'S TREATMENT:                                                                                                                              DATE:  02/03/23  EVAL  Manual:  Neuromuscular re-education:  Exercises:  Therapeutic activities:     PATIENT EDUCATION:  Education details: See above Person educated: Patient Education method: Explanation, Demonstration, Tactile cues, Verbal cues, and Handouts Education comprehension: verbalized understanding  HOME EXERCISE PROGRAM: ***  ASSESSMENT:  CLINICAL IMPRESSION: Patient is a 43 y.o. female who was seen today for physical therapy evaluation and treatment for ***.   OBJECTIVE IMPAIRMENTS:  decreased activity tolerance, decreased coordination, decreased endurance, decreased strength, increased fascial restrictions, increased muscle spasms, impaired tone, postural dysfunction, and pain.   ACTIVITY LIMITATIONS: continence  PARTICIPATION LIMITATIONS: {participationrestrictions:25113}  PERSONAL FACTORS: 1 comorbidity: medical history  are also affecting  patient's functional outcome.   REHAB POTENTIAL: Good  CLINICAL DECISION MAKING: Stable/uncomplicated  EVALUATION COMPLEXITY: Low   GOALS: Goals reviewed with patient? Yes  SHORT TERM GOALS: Target date: 03/03/23  Pt will be independent with HEP.   Baseline: Goal status: INITIAL  2.  *** Baseline:  Goal status: INITIAL  3.  *** Baseline:  Goal status: INITIAL  4.  *** Baseline:  Goal status: INITIAL  5.  *** Baseline:  Goal status: INITIAL  6.  *** Baseline:  Goal status: INITIAL  LONG TERM GOALS: Target date: ***  Pt will be independent with advanced HEP.   Baseline:  Goal status: INITIAL  2.  *** Baseline:  Goal status: INITIAL  3.  *** Baseline:  Goal status: INITIAL  4.  *** Baseline:  Goal status: INITIAL  5.  *** Baseline:  Goal status: INITIAL  6.  *** Baseline:  Goal status: INITIAL  PLAN:  PT FREQUENCY: 1-2x/week  PT DURATION: 6 months  PLANNED INTERVENTIONS: 97110-Therapeutic exercises, 97530- Therapeutic activity, 97112- Neuromuscular re-education, 97535- Self Care, 16109- Manual therapy, Dry Needling, and Biofeedback  PLAN FOR NEXT SESSION: ***   Julio Alm, PT, DPT01/28/258:07 AM

## 2023-02-10 ENCOUNTER — Ambulatory Visit: Payer: Medicaid Other

## 2023-02-16 ENCOUNTER — Ambulatory Visit (INDEPENDENT_AMBULATORY_CARE_PROVIDER_SITE_OTHER): Payer: Medicaid Other | Admitting: Internal Medicine

## 2023-02-16 ENCOUNTER — Encounter: Payer: Self-pay | Admitting: Internal Medicine

## 2023-02-16 VITALS — BP 122/70 | HR 69 | Ht 65.0 in | Wt 170.4 lb

## 2023-02-16 DIAGNOSIS — E1065 Type 1 diabetes mellitus with hyperglycemia: Secondary | ICD-10-CM | POA: Diagnosis not present

## 2023-02-16 DIAGNOSIS — E7800 Pure hypercholesterolemia, unspecified: Secondary | ICD-10-CM | POA: Diagnosis not present

## 2023-02-16 LAB — POCT GLYCOSYLATED HEMOGLOBIN (HGB A1C): Hemoglobin A1C: 7 % — AB (ref 4.0–5.6)

## 2023-02-16 MED ORDER — FREESTYLE LIBRE 3 PLUS SENSOR MISC: 1.0000 | 3 refills | Status: AC

## 2023-02-16 NOTE — Progress Notes (Signed)
 Patient ID: Theresa Koch, female   DOB: Apr 08, 1980, 43 y.o.   MRN: [id number]  HPI: Theresa Koch is a 43 y.o.-year-old female, initially referred by Dr. Carrolyn Clan, returning for f/u for DM1, dx in 2011, but at DM1 in 02/2015, insulin -dependent since 2013, uncontrolled, without long term complications. Last visit 4 months ago.   She is usually late for her appointment 15-20 minutes). Today: 15 min.  Interim history: No increased urination, blurry vision, nausea. She is still b'feeding. She plans to continue for 4 to 5 months more.  Reviewed HbA1c levels: Lab Results  Component Value Date   HGBA1C 7.0 (A) 10/14/2022   HGBA1C 6.2 (A) 06/30/2022   HGBA1C 7.3 (H) 02/24/2022   HGBA1C 7.1 (A) 11/19/2021   HGBA1C 7.8 (A) 07/24/2021   HGBA1C 8.2 (A) 03/15/2021   HGBA1C 7.7 (A) 11/02/2020   HGBA1C 7.9 (A) 05/31/2020   HGBA1C 8.1 (A) 11/25/2019   HGBA1C 8.0 (A) 01/11/2019   HGBA1C 8.8 (A) 03/22/2018   HGBA1C 7.9 (A) 09/04/2017   HGBA1C 7.8 04/10/2017   HGBA1C 6.0 03/10/2016   HGBA1C 5.6 12/14/2015   HGBA1C 7.2 09/14/2015   HGBA1C 7.1 05/23/2015   HGBA1C 8.1 (H) 02/09/2015   HGBA1C 6.5 08/29/2014   HGBA1C 6.2 05/12/2014  06/20/2022: HbA1c 6.2%  She is on:  - Basaglar  7 units in am and 3-4 units at bedtime >> 16 units in am and 0-4 units at night >> 16 >> 10 units in am >> 6-8 units daily (may need to split the dose if sugars start increasing overnight - 3-4 in am and 5 at night) >> actually using 12 units! - Humalog :  4-5 >> 6-8 >> 6-7 >> 5-6 units 15-30 before tea in am  6-7 >> 8-10 (12) >> 6-7 >> 5-6 units before lunch 2-3 >> 4 >> 5>> 3-4  units before dinner  At night, do not use more than 1-2 units NovoLog  for correction. When you correct a low, do not use more then 15-30 g carbs.  We stopped Metformin  XR 500 mg 3x a day with meals. We stopped Glipizide  XL 5 mg in am.  Pt.checks her sugar is more than 4 times a day with her libre  CGM:  Previously:  Previously:   Lowest sugar was 50s >> 49 >> 40s; she has hypoglycemia awareness in the 60s. Highest sugar was 400 (forgot to bolus) >> 399.  Glucometer: One Touch Ultra  Pt's meals are: - Breakfast: tea with milk + cookies >> tea, waffle - Lunch: salad, soup, cheese  - Dinner: egg, cheese, meat, sometimes cereals  - after dinner: milk >> stopped   No CKD; latest BUN/creatinine: Lab Results  Component Value Date   BUN 9 08/22/2022   Lab Results  Component Value Date   CREATININE 0.70 08/22/2022   No MAU: Lab Results  Component Value Date   MICRALBCREAT 1.4 10/14/2022   MICRALBCREAT 1.0 11/19/2021   MICRALBCREAT 1.6 11/02/2020   MICRALBCREAT 1.1 11/25/2019   MICRALBCREAT 1.1 01/11/2019   MICRALBCREAT 0.9 04/10/2017   MICRALBCREAT 0.7 05/23/2015  She is not on ACE inhibitor/ARB.  + Mild HL: Lab Results  Component Value Date   CHOL 178 10/14/2022   HDL 60.80 10/14/2022   LDLCALC 99 10/14/2022   TRIG 90.0 10/14/2022   CHOLHDL 3 10/14/2022  She is not on a statin.  - last eye exam was in 01/2021: No DR reportedly.  - no numbness and tingling in her feet.  Foot  exam performed 03/26/2022.  Latest TSH was reviewed and this was normal: Lab Results  Component Value Date   TSH 2.97 10/14/2022   Patient gave birth on 08/23/2022 to a healthy baby girl (Apgar 9, 9).  She has 3 other girls. She has anemia. Hemoglobin was 7.6 (!) on 05/26/2022, but improved afterwards, the latest value being 9.3 08/24/2022. She is on iron  supplements and prenatal vitamins, but was taken inconsistently.   ROS: + see HPI  I reviewed pt's medications, allergies, PMH, social hx, family hx, and changes were documented in the history of present illness. Otherwise, unchanged from my initial visit note.  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   Previous cesarean section 10/30/2014    TOLAC successful 2016    Pure hypercholesterolemia 03/15/2021   Seasonal allergies    Supervision of high risk pregnancy, antepartum 02/04/2022              Nursing Staff    Provider      Office Location    [facility]    Dating     08/22/2022, by Last Menstrual Period      [facility] Model    [x]  Traditional  [ ]  Centering  [ ]  Mom-Baby Dyad                Language     English    Anatomy US      Normal, but incomplete > FU Scheduled   Fetal echo scheduled       Flu Vaccine     Declined-02/04/22    Genetic/Carrier Screen     NIPS: LR female     Type 1 diabetes mellitus with hyperglycemia, with long-term current use of insulin  (HCC) 05/23/2015   Past Surgical History:  Procedure Laterality Date   CESAREAN SECTION     CESAREAN SECTION N/A 05/09/2016   Procedure: CESAREAN SECTION;  Surgeon: Matt Song, MD;  Location: [facility] BIRTHING SUITES;  Service: Obstetrics;  Laterality: N/A;   History   Social History   Marital Status: Married    Spouse Name: N/A    Number of Children: 1   Occupational History   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 100 tablet 1   BD PEN NEEDLE NANO 2ND GEN 32G X 4 MM MISC USE AS DIRECTED FIVE TIMES DAILY 400 each 3   Blood Glucose Monitoring Suppl (ACCU-CHEK GUIDE) w/Device KIT 1 Device by Does not apply route as needed. 1 kit 2   Continuous Blood Gluc Receiver (FREESTYLE LIBRE 2 READER) DEVI 1 EACH BY DOES NOT APPLY ROUTE ONCE FOR 1 DOSE. 1 each 0   Continuous Glucose Sensor (FREESTYLE LIBRE 2 SENSOR) MISC  CHANGE EVERY 14 DAYS 6 each 3   ferrous sulfate  325 (65 FE) MG EC tablet Take 1 tablet (325 mg total) by mouth every other day. 45 tablet 1   Glucagon   3 MG/DOSE POWD Place 3 mg into the nose once as needed for up to 1 dose. 1 each 11   glucose blood (ACCU-CHEK GUIDE) test strip Use as instructed; check blood glucose 4 times daily 100 each 11   ibuprofen  (ADVIL ) 600 MG tablet Take 1 tablet (600 mg total) by mouth every 6 (six) hours. (Patient not taking: Reported on 11/04/2022) 100 tablet 1   Insulin  Glargine (BASAGLAR  KWIKPEN) 100 UNIT/ML Inject 10-20 Units into the skin daily. (Patient taking differently: Inject 10-12 Units into the skin daily.) 30 mL 3   insulin  lispro (HUMALOG  KWIKPEN) 100 UNIT/ML KwikPen INJECT UP TO 20 UNITS INTO THE SKIN DAILY AS ADVISED. 30 mL 3   lidocaine  (LIDODERM ) 5 % Place 1 patch onto the skin daily. Remove & Discard patch within 12 hours or as directed by MD 30 patch 1   prenatal vitamin w/FE, FA (PRENATAL 1 + 1) 27-1 MG TABS tablet Take 1 tablet by mouth daily at 12 noon. 30 tablet 12   senna-docusate (SENOKOT-S) 8.6-50 MG tablet Take 2 tablets by mouth daily. (Patient not taking: Reported on 11/04/2022) 60 tablet 1   No current facility-administered medications on file prior to visit.   Allergies  Allergen Reactions   Pork-Derived Products    Penicillins Rash    Has patient had a PCN reaction causing immediate rash, facial/tongue/throat swelling, SOB or lightheadedness with hypotension: Yes Has patient had a PCN reaction causing severe rash involving mucus membranes or skin necrosis: No Has patient had a PCN reaction that required hospitalization No Has patient had a PCN reaction occurring within the last 10 years: No If all of the above answers are "NO", then may proceed with Cephalosporin use. Childhood allergy     Family History  Problem Relation Age of Onset   Hypertension Mother    Diabetes Father    Kidney disease Father    Diabetes Sister     Diabetes Brother    Breast cancer Neg Hx    PE: There were no vitals taken for this visit. Wt Readings from Last 3 Encounters:  11/04/22 166 lb 11.2 oz (75.6 kg)  10/14/22 168 lb 3.2 oz (76.3 kg)  08/22/22 176 lb (79.8 kg)   Constitutional: Normal weight, in NAD Eyes: EOMI, no exophthalmos ENT: no thyromegaly, no cervical lymphadenopathy Cardiovascular: RRR, No MRG Respiratory: CTA B Musculoskeletal: no deformities Skin: no rashes Neurological: no tremor with outstretched hands Diabetic Foot Exam - Simple   Simple Foot Form Diabetic Foot exam was performed with the following findings: Yes 02/16/2023  9:33 AM  Visual Inspection No deformities, no ulcerations, no other skin breakdown bilaterally: Yes Sensation Testing Intact to touch and monofilament testing bilaterally: Yes Pulse Check Posterior Tibialis and Dorsalis pulse intact bilaterally: Yes Comments    ASSESSMENT: 1. DM1, uncontrolled, without long term complications, but with hypo-/hyperglycemia  We confirmed DM1: Component     Latest Ref Rng 02/09/2015  C-Peptide     0.80 - 3.90 ng/mL 0.24 (L)  Glucose, Fasting     65 - 99 mg/dL 64 (L)  Glutamic Acid Decarb Ab     <5 IU/mL 42 (H)  Pancreatic Islet Cell Antibody     < 5 JDF Units <5   2. HL  PLAN:  1. Patient with uncontrolled type 1 diabetes, basal-bolus insulin  regimen, with slightly worsening blood sugars at last visit.  At that time, she returned for the first visit postpartum.  Sugars were fluctuating within the target range but with  many lows throughout the day and night and also occasional hyperglycemic spikes.  She felt that her sugars were consistently low, mostly in the 50s, even after large meals.  At that time, she was breast-feeding.  Upon questioning, she was using slightly higher doses of Humalog  than recommended.  I advised her to reduce both Basaglar  and Humalog  doses.  We did discuss that for such a low dose of Basaglar , she may need to split the  dose in 2 boluses.  She agreed to try this.  HbA1c at that time was 7.0%, increased from 6.2%. CGM interpretation: -At today's visit, we reviewed her CGM downloads: It appears that 56% of values are in target range (goal >70%), while 24% are higher than 180 (goal <25%), and 20% are lower than 70 (goal <4%).  The calculated average blood sugar is 135.  The projected HbA1c for the next 3 months (GMI) is 6.5%. -Reviewing the CGM trends, sugars appear to be fluctuating within the target range but with many low blood sugars at all times of day and night and more fluctuating values after lunch and dinner.  Also, overnight, she sometimes has high blood sugars up to 250s or higher and also many low blood sugars in the second half of the night, even under 50s. -Upon questioning, patient tells me that she is increasing her Basaglar  based on the peaks and valleys in her blood sugars.  For example, if the sugars are in the 400s, she will increase Basaglar  by 3 units.  We discussed that this is not appropriate, she needs to correct these with Humalog , rather than Basaglar .  I did advise her how to evaluate the need to change her Basaglar  dose, depending on trends in blood sugars if she is not eating or doing strenuous exercise.  For now, I advised her to decrease the Basaglar  dose and split it.  We also discussed about lowering her breakfast and lunch Humalog  but occasionally using higher doses for larger meals. - I suggested to:  Patient Instructions  Please use the following regimen: - Basaglar  6-7 units in am and 3 units at night - Humalog :  B and L: 3-4 (5) units  D: 3-4 (5) units   Please return in 3 months.  - we checked her HbA1c: 7.0% (stable) - advised to check sugars at different times of the day - 4x a day, rotating check times - advised for yearly eye exams >> she is not UTD - s has a lot of errors in her freestyle libre 2 sensors.  Given libre 3+ samples a prescription to the pharmacy - return to  clinic in 3 months   2. HL -Lipid fractions are at goal: Lab Results  Component Value Date   CHOL 178 10/14/2022   HDL 60.80 10/14/2022   LDLCALC 99 10/14/2022   TRIG 90.0 10/14/2022   CHOLHDL 3 10/14/2022  -She is not on a statin  Emilie Harden, MD PhD [facility] Endocrinology

## 2023-02-16 NOTE — Patient Instructions (Addendum)
 Please use the following regimen: - Basaglar  6-7 units in am and 3 units at night - Humalog :  B and L: 3-4 (5) units  D: 3-4 (5) units   Please return in 3 months.

## 2023-02-17 ENCOUNTER — Ambulatory Visit: Payer: Medicaid Other | Attending: Student

## 2023-02-17 ENCOUNTER — Other Ambulatory Visit: Payer: Self-pay

## 2023-02-17 DIAGNOSIS — R293 Abnormal posture: Secondary | ICD-10-CM | POA: Diagnosis present

## 2023-02-17 DIAGNOSIS — R279 Unspecified lack of coordination: Secondary | ICD-10-CM | POA: Insufficient documentation

## 2023-02-17 DIAGNOSIS — M62838 Other muscle spasm: Secondary | ICD-10-CM | POA: Diagnosis present

## 2023-02-17 DIAGNOSIS — M545 Low back pain, unspecified: Secondary | ICD-10-CM | POA: Diagnosis not present

## 2023-02-17 DIAGNOSIS — M6281 Muscle weakness (generalized): Secondary | ICD-10-CM | POA: Diagnosis present

## 2023-02-17 DIAGNOSIS — M5459 Other low back pain: Secondary | ICD-10-CM | POA: Diagnosis present

## 2023-02-17 DIAGNOSIS — N3941 Urge incontinence: Secondary | ICD-10-CM | POA: Diagnosis not present

## 2023-02-17 NOTE — Patient Instructions (Signed)
Urge Incontinence  Ideal urination frequency is every 2-4 wakeful hours, which equates to 5-8 times within a 24-hour period.   Urge incontinence is leakage that occurs when the bladder muscle contracts, creating a sudden need to go before getting to the bathroom.   Going too often when your bladder isn't actually full can disrupt the body's automatic signals to store and hold urine longer, which will increase urgency/frequency.  In this case, the bladder "is running the show" and strategies can be learned to retrain this pattern.   One should be able to control the first urge to urinate, at around .  The bladder can hold up to a "grande latte," or . To help you gain control, practice the Urge Drill below when urgency strikes.  This drill will help retrain your bladder signals and allow you to store and hold urine longer.  The overall goal is to stretch out your time between voids to reach a more manageable voiding schedule.    Practice your "quick flicks" often throughout the day (each waking hour) even when you don't need feel the urge to go.  This will help strengthen your pelvic floor muscles, making them more effective in controlling leakage.  Urge Drill  When you feel an urge to go, follow these steps to regain control: Stop what you are doing and be still Take one deep breath, directing your air into your abdomen Think an affirming thought, such as "I've got this." Do 5 quick flicks of your pelvic floor Walk with control to the bathroom to void, or delay voiding  [facility] [address], [address] [place], [place] [address] Phone # [phone] Fax [phone]

## 2023-02-17 NOTE — Therapy (Addendum)
 OUTPATIENT PHYSICAL THERAPY FEMALE PELVIC EVALUATION   Patient Name: Theresa Koch MRN: [id number] DOB:1980/03/20, 43 y.o., female Today's Date: 02/17/2023  END OF SESSION:  PT End of Session - 02/17/23 1059     Visit Number 1    Date for PT Re-Evaluation 08/04/23    Authorization Type [employer]    Authorization Time Period waiting for auth approval    PT Start Time 1100    PT Stop Time 1140    PT Time Calculation (min) 40 min    Activity Tolerance Patient tolerated treatment well    Behavior During Therapy [facility] for tasks assessed/performed             Past Medical History:  Diagnosis Date   Diabetes mellitus without complication [facility])    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   Previous cesarean section 10/30/2014   TOLAC successful 2016    Pure hypercholesterolemia 03/15/2021   Seasonal allergies    Supervision of high risk pregnancy, antepartum 02/04/2022              Nursing Staff    Provider      Office Location    [facility]    Dating     08/22/2022, by Last Menstrual Period      [facility] Model    [x]  Traditional  [ ]  Centering  [ ]  Mom-Baby Dyad                Language     English    Anatomy US     Normal, but incomplete > FU Scheduled   Fetal echo scheduled       Flu Vaccine     Declined-02/04/22    Genetic/Carrier Screen     NIPS: LR female     Type 1 diabetes mellitus with hyperglycemia, with long-term current use of insulin (HCC) 05/23/2015   Past Surgical History:  Procedure Laterality Date   CESAREAN SECTION     CESAREAN SECTION N/A 05/09/2016   Procedure: CESAREAN SECTION;  Surgeon: Carrington Clamp, MD;  Location: [facility] BIRTHING SUITES;  Service: Obstetrics;  Laterality: N/A;   Patient Active Problem List   Diagnosis Date Noted   History of VBAC 07/14/2022   Anemia affecting pregnancy 06/09/2022   History of 2 cesarean sections 02/25/2022   Type 1 diabetes (HCC) 02/24/2022    PCP: NA  REFERRING PROVIDER: Corlis Hove,  NP   REFERRING DIAG: Z39.2 (ICD-10-CM) - Encounter for routine postpartum follow-up N39.41 (ICD-10-CM) - Urge incontinence of urine M54.50 (ICD-10-CM) - Bilateral low back pain without sciatica, unspecified chronicity   THERAPY DIAG:  Muscle weakness (generalized)  Other muscle spasm  Unspecified lack of coordination  Other low back pain  Abnormal posture  Rationale for Evaluation and Treatment: Rehabilitation  ONSET DATE: 08/23/23  SUBJECTIVE:  SUBJECTIVE STATEMENT: Pt is about 6 months postpartum after VBAC, 4th delivery. She is currently breast feeding. She is having mild incontinence and urgency. Her last delivery was 08/23/2022. She had the urinary incontinence after other deliveries, but it went away. Pain started after last delivery.    PAIN:  Are you having pain? Yes NPRS scale: 8/10 Pain location:  low back, bil  Pain type: tight Pain description: intermittent   Aggravating factors: holding baby, helping mom, bending forward Relieving factors: lidocaine patches  PRECAUTIONS: None  RED FLAGS: None   WEIGHT BEARING RESTRICTIONS: No  FALLS:  Has patient fallen in last 6 months? 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   PLOF: Independent  PATIENT GOALS: stop urinary leaking; decrease low back pain (caretaker for mom and wants to be able to help her to the best of her ability)  PERTINENT HISTORY:  C-section x 2, VBAC, diabetes mellitus type I, circumcision,    BOWEL MOVEMENT: Pain with bowel movement: No Type of bowel movement:Frequency 1x/day and Strain No Fully empty rectum: Yes: - Leakage: No Pads: No Fiber supplement: No  URINATION: Pain with urination:  No Fully empty bladder: Yes: - Stream: Strong Urgency: Yes: - Frequency: every 2-3 hours during the day; no nocturia typically Leakage: Urge to void, Walking to the bathroom, Coughing, Sneezing, and Laughing Pads: No  INTERCOURSE: Pain with intercourse: none Ability to have vaginal penetration:  Yes: - Climax: WNL Marinoff Scale: 0/3  PREGNANCY: Vaginal deliveries 2 Tearing Yes: unsure grade C-section deliveries 2 Currently pregnant No  PROLAPSE: Bulge   OBJECTIVE:  Note: Objective measures were completed at Evaluation unless otherwise noted.  02/03/23: PFIQ-7: 57  COGNITION: Overall cognitive status: Within functional limits for tasks assessed     SENSATION: Light touch: Appears intact Proprioception: Appears intact   FUNCTIONAL TESTS:  Squat: WNL Single leg stance:  Rt: Lt pelvic drop  Lt: Rt pelvic drop  Curl-up test: 2 finger width diastasis with distortion  GAIT: Comments: decreased hip extension  POSTURE: rounded shoulders, forward head, decreased lumbar lordosis, decreased thoracic kyphosis, posterior pelvic tilt, and Rt posterior rotation , Rt thoracic curvature  LUMBARAROM/PROM:  A/PROM A/PROM  Eval (% available)  Flexion 80  Extension 50  Right lateral flexion 75  Left lateral flexion 75  Right rotation 50  Left rotation 50   (Blank rows = not tested)  PALPATION:   General  Restriction in Lt lower quadrant; no abdominal tenderness; mild c-section scar tissue restriction; pain in bil lumbar paraspinals and SIJ                External Perineal Exam evidence of female circumcision; scar tissue and tear at posterior fourchette                             Internal Pelvic Floor mild tenderness in bil piriformis and obturator internus; perineal scar tissue restriction  Patient confirms identification and approves PT to assess internal pelvic floor and treatment Yes  PELVIC MMT:   MMT eval  Vaginal 2/5, 3 second endurance, 3 repeat  contractions  Diastasis Recti 2 finger width at/below/above umbilicus with distortion  (Blank rows = not tested)        TONE: Mild increase in tone  PROLAPSE: Grade 2 anterior vaginal wall laxity  TODAY'S TREATMENT:  DATE:  02/16/23  EVAL  Neuromuscular re-education: Pt provides verbal consent for internal vaginal/rectal pelvic floor exam. Internal vaginal pelvic floor muscle contraction training Quick flicks Long holds Urge drill Exercises: Lower trunk rotation 2 x 10 Cat cow 10x Seated piriformis stretch 60 sec bil Seated forward fold 10 breaths  For all possible CPT codes, reference the Planned Interventions line above.     Check all conditions that are expected to impact treatment: {Conditions expected to impact treatment:Current pregnancy or recent postpartum   If treatment provided at initial evaluation, no treatment charged due to lack of authorization.       PATIENT EDUCATION:  Education details: See above Person educated: Patient Education method: Explanation, Demonstration, Tactile cues, Verbal cues, and Handouts Education comprehension: verbalized understanding  HOME EXERCISE PROGRAM: [id number]  ASSESSMENT:  CLINICAL IMPRESSION: Patient is a 43 y.o. female who was seen today for physical therapy evaluation and treatment for low back pain and urinary incontinence. Exam findings notable for decreased lumbar A/ROM, abnormal posture/gait, tenderness and restriction in bil lumbar paraspinals (Rt>Lt), poor pelvic stability in single leg stance, diastasis rect 2 finger widths throughout midline with distortion, tenderness in bil lumbar paraspinals/SIJ, perineal scar tissue restriction, pelvic floor muscle weakness and decreased endurance, perineal scar tissue, and anterior/posterior vaginal wall laxity. Signs and symptoms are most consistent with  pelvic floor/core muscle weakness, muscle spasm in low back, and scar tissue restriction. Initial treatment consisted of pelvic floor muscle contraction training, the urge drill, and mobility exercises to help improve low back pain. She will continue to benefit from skilled PT intervention in order to decrease low back pain, improve bladder control, and begin/progress functional strengthening program.   OBJECTIVE IMPAIRMENTS: decreased activity tolerance, decreased coordination, decreased endurance, decreased strength, increased fascial restrictions, increased muscle spasms, impaired tone, postural dysfunction, and pain.   ACTIVITY LIMITATIONS: continence  PARTICIPATION LIMITATIONS: meal prep, cleaning, laundry, community activity, and childcare/caregiving  PERSONAL FACTORS: 1 comorbidity: medical history  are also affecting patient's functional outcome.   REHAB POTENTIAL: Good  CLINICAL DECISION MAKING: Stable/uncomplicated  EVALUATION COMPLEXITY: Low   GOALS: Goals reviewed with patient? Yes  SHORT TERM GOALS: Target date: 03/17/2023    Pt will be independent with HEP.   Baseline: Goal status: INITIAL  2.  Pt will be able to correctly perform diaphragmatic breathing and appropriate pressure management in order to prevent worsening vaginal wall laxity and improve pelvic floor A/ROM.   Baseline:  Goal status: INITIAL  3.  Pt will improve urinary incontinence by 25%. Baseline:  Goal status: INITIAL  4.  Pt will report low back pain no greater than 5/10.  Baseline:  Goal status: INITIAL  5.  Pt will be able to teach back and utilize urge suppression technique in order to help reduce number of trips to the bathroom.    Baseline:  Goal status: INITIAL   LONG TERM GOALS: Target date: 08/04/23  Pt will be independent with advanced HEP.   Baseline:  Goal status: INITIAL  2.  Pt will improve urinary incontinence by 75%. Baseline:  Goal status: INITIAL  3.  Pt will  report low back pain no greater than 2/10.  Baseline:  Goal status: INITIAL  4.  Pt will be able to go 2-3 hours in between voids without urgency or incontinence in order to improve QOL and perform all functional activities with less difficulty.    Baseline:  Goal status: INITIAL   PLAN:  PT FREQUENCY: 1-2x/week  PT DURATION: 6 months  PLANNED INTERVENTIONS: 97110-Therapeutic exercises, 97530- Therapeutic activity, [id number]- Neuromuscular re-education, 97535- Self Care, [id number]- Manual therapy, Dry Needling, and Biofeedback  PLAN FOR NEXT SESSION: Manual techniques to low back; begin core training; progress mobility.    Julio Alm, PT, DPT02/11/251:57 PM  PHYSICAL THERAPY DISCHARGE SUMMARY  Visits from Start of Care: 1  Current functional level related to goals / functional outcomes: Not met    Remaining deficits: See above   Education / Equipment: HEP   Patient agrees to discharge. Patient goals were not met. Patient is being discharged due to not returning since the last visit.  Julio Alm, PT, DPT02/25/2511:39 AM

## 2023-02-24 ENCOUNTER — Telehealth: Payer: Self-pay

## 2023-02-24 NOTE — Telephone Encounter (Signed)
Called and left message after no-show appointment 02/24/23 at 11:00am. Pt informed of our attendance policy. We confirmed her next appointment in message.

## 2023-03-03 ENCOUNTER — Ambulatory Visit: Payer: Medicaid Other

## 2023-03-11 ENCOUNTER — Other Ambulatory Visit: Payer: Self-pay | Admitting: Advanced Practice Midwife

## 2023-03-11 DIAGNOSIS — O0990 Supervision of high risk pregnancy, unspecified, unspecified trimester: Secondary | ICD-10-CM

## 2023-04-22 ENCOUNTER — Ambulatory Visit: Payer: Medicaid Other

## 2023-05-12 ENCOUNTER — Other Ambulatory Visit: Payer: Self-pay | Admitting: Internal Medicine

## 2023-05-12 DIAGNOSIS — E1065 Type 1 diabetes mellitus with hyperglycemia: Secondary | ICD-10-CM

## 2023-06-22 ENCOUNTER — Ambulatory Visit (INDEPENDENT_AMBULATORY_CARE_PROVIDER_SITE_OTHER): Payer: Medicaid Other | Admitting: Internal Medicine

## 2023-06-22 ENCOUNTER — Encounter: Payer: Self-pay | Admitting: Internal Medicine

## 2023-06-22 VITALS — BP 110/70 | HR 69 | Ht 65.0 in | Wt 167.6 lb

## 2023-06-22 DIAGNOSIS — E1065 Type 1 diabetes mellitus with hyperglycemia: Secondary | ICD-10-CM

## 2023-06-22 DIAGNOSIS — E7800 Pure hypercholesterolemia, unspecified: Secondary | ICD-10-CM

## 2023-06-22 LAB — POCT GLYCOSYLATED HEMOGLOBIN (HGB A1C): Hemoglobin A1C: 7.4 % — AB (ref 4.0–5.6)

## 2023-06-22 LAB — GLUCOSE, POCT (MANUAL RESULT ENTRY): POC Glucose: 71 mg/dL (ref 70–99)

## 2023-06-22 NOTE — Addendum Note (Signed)
 Addended by: Vernon Goodpasture on: 06/22/2023 10:48 AM   Modules accepted: Orders

## 2023-06-22 NOTE — Patient Instructions (Addendum)
 Please use the following regimen: - Basaglar  6-7 units in am and 3-4 units at night  Change: - Humalog : 3-4 (5) units 15-20 min before meals  Try to include healthy fats and protein with all meals - start with these and end with carbs.   Please return in 3 months.

## 2023-06-22 NOTE — Progress Notes (Signed)
 Patient ID: Theresa Koch, female   DOB: Oct 13, 1980, 43 y.o.   MRN: [id number]  HPI: Theresa Koch is a 43 y.o.-year-old female, initially referred by Dr. Carrolyn Clan, returning for f/u for DM1, dx in 2011, but at DM1 in 02/2015, insulin -dependent since 2013, uncontrolled, without long term complications. Last visit 4 months ago.   Interim history: No increased urination, blurry vision, nausea.  No migraines.  She had these with low blood sugars in the past, during her pregnancy. She is still b'feeding her daughter - she accompanies her today.  Reviewed HbA1c levels: Lab Results  Component Value Date   HGBA1C 7.0 (A) 02/16/2023   HGBA1C 7.0 (A) 10/14/2022   HGBA1C 6.2 (A) 06/30/2022   HGBA1C 7.3 (H) 02/24/2022   HGBA1C 7.1 (A) 11/19/2021   HGBA1C 7.8 (A) 07/24/2021   HGBA1C 8.2 (A) 03/15/2021   HGBA1C 7.7 (A) 11/02/2020   HGBA1C 7.9 (A) 05/31/2020   HGBA1C 8.1 (A) 11/25/2019   HGBA1C 8.0 (A) 01/11/2019   HGBA1C 8.8 (A) 03/22/2018   HGBA1C 7.9 (A) 09/04/2017   HGBA1C 7.8 04/10/2017   HGBA1C 6.0 03/10/2016   HGBA1C 5.6 12/14/2015   HGBA1C 7.2 09/14/2015   HGBA1C 7.1 05/23/2015   HGBA1C 8.1 (H) 02/09/2015   HGBA1C 6.5 08/29/2014  06/20/2022: HbA1c 6.2%  At last visit she was on:  - Basaglar  7 units in am and 3-4 units at bedtime >> 16 units in am and 0-4 units at night >> 16 >> 10 units in am >> 6-8 units daily (may need to split the dose if sugars start increasing overnight - 3-4 in am and 5 at night) >> actually using 12 units! - Humalog :  4-5 >> 6-8 >> 6-7 >> 5-6 units 15-30 before tea in am  6-7 >> 8-10 (12) >> 6-7 >> 5-6 units before lunch 2-3 >> 4 >> 5>> 3-4  units before dinner  At night, do not use more than 1-2 units NovoLog  for correction. When you correct a low, do not use more then 15-30 g carbs. We stopped Metformin  XR 500 mg 3x a day with meals. We stopped Glipizide  XL 5 mg in am.  At last visit I suggested the following regimen: - Basaglar   6-7 units in am and 3-4 units at night  - Humalog :  B and L: 3-4 (5) units >> actually taking B: 3-5, L: 4-6 (7) D: 3-4 (5) units  Pt.checks her sugar is more than 4 times a day with her libre CGM - name: Theresa Koch:  Prev.  Previously:   Lowest sugar was 50s >> 49 >> 40s >> 40s; she has hypoglycemia awareness in the 60s. Highest sugar was 400 (forgot to bolus) >> 399.  Glucometer: One Touch Ultra  Pt's meals are: - Breakfast: tea with milk + cookies >> tea, waffle - Lunch: salad, soup, cheese  - Dinner: egg, cheese, meat, sometimes cereals  - after dinner: milk >> stopped   No CKD; latest BUN/creatinine: Lab Results  Component Value Date   BUN 9 08/22/2022   Lab Results  Component Value Date   CREATININE 0.70 08/22/2022   No MAU: Lab Results  Component Value Date   MICRALBCREAT 1.4 10/14/2022   MICRALBCREAT 1.0 11/19/2021   MICRALBCREAT 1.6 11/02/2020   MICRALBCREAT 1.1 11/25/2019   MICRALBCREAT 1.1 01/11/2019   MICRALBCREAT 0.9 04/10/2017   MICRALBCREAT 0.7 05/23/2015  She is not on ACE inhibitor/ARB.  + Mild HL: Lab Results  Component Value Date  CHOL 178 10/14/2022   HDL 60.80 10/14/2022   LDLCALC 99 10/14/2022   TRIG 90.0 10/14/2022   CHOLHDL 3 10/14/2022  She is not on a statin.  - last eye exam was in 01/2021: No DR reportedly.  - no numbness and tingling in her feet.  Foot exam performed 02/16/2023.  Latest TSH was reviewed and this was normal: Lab Results  Component Value Date   TSH 2.97 10/14/2022   Patient gave birth on 43/17/2024 to a healthy baby girl (Apgar 9, 9).  She has 3 other girls. She has anemia. Hemoglobin was 7.6 (!) on 05/26/2022, but improved afterwards, the latest value being 9.3 08/24/2022. She is on iron  supplements and prenatal vitamins, but was taken inconsistently.   ROS: + see HPI  I reviewed pt's medications, allergies, PMH, social hx, family hx, and changes were documented in the history of present illness.  Otherwise, unchanged from my initial visit note.  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   Previous cesarean section 10/30/2014   TOLAC successful 2016    Pure hypercholesterolemia 03/15/2021   Seasonal allergies    Supervision of high risk pregnancy, antepartum 02/04/2022              Nursing Staff    Provider      Office Location    [facility]    Dating     08/22/2022, by Last Menstrual Period      [facility] Model    [x]  Traditional  [ ]  Centering  [ ]  Mom-Baby Dyad                Language     English    Anatomy US      Normal, but incomplete > FU Scheduled   Fetal echo scheduled       Flu Vaccine     Declined-02/04/22    Genetic/Carrier Screen     NIPS: LR female     Type 1 diabetes mellitus with hyperglycemia, with long-term current use of insulin  (HCC) 05/23/2015   Past Surgical History:  Procedure Laterality Date   CESAREAN SECTION     CESAREAN SECTION N/A 05/09/2016   Procedure: CESAREAN SECTION;  Surgeon: Matt Song, MD;  Location: [facility] BIRTHING SUITES;  Service: Obstetrics;  Laterality: N/A;   History   Social History   Marital Status: Married    Spouse Name: N/A    Number of Children: 1   Occupational History   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 100 tablet 1   BD PEN NEEDLE NANO 2ND GEN 32G X 4 MM MISC USE AS DIRECTED FIVE TIMES DAILY 400 each 3   Blood Glucose Monitoring Suppl (ACCU-CHEK GUIDE) w/Device KIT 1 Device  by Does not apply route as needed. 1 kit 2   Continuous Blood Gluc Receiver (FREESTYLE LIBRE 2 READER) DEVI 1 EACH BY DOES NOT APPLY  ROUTE ONCE FOR 1 DOSE. 1 each 0   Continuous Glucose Sensor (FREESTYLE LIBRE 3 PLUS SENSOR) MISC 1 each by Does not apply route every 14 (fourteen) days. 6 each 3   ferrous sulfate  325 (65 FE) MG EC tablet Take 1 tablet (325 mg total) by mouth every other day. 45 tablet 1   Glucagon  3 MG/DOSE POWD Place 3 mg into the nose once as needed for up to 1 dose. 1 each 11   glucose blood (ACCU-CHEK GUIDE) test strip Use as instructed; check blood glucose 4 times daily 100 each 11   ibuprofen  (ADVIL ) 600 MG tablet Take 1 tablet (600 mg total) by mouth every 6 (six) hours. 100 tablet 1   Insulin  Glargine (BASAGLAR  KWIKPEN) 100 UNIT/ML Inject 10-12 Units into the skin daily. 30 mL 3   insulin  lispro (HUMALOG  KWIKPEN) 100 UNIT/ML KwikPen INJECT UP TO 20 UNITS INTO THE SKIN DAILY AS ADVISED. 30 mL 3   lidocaine  (LIDODERM ) 5 % Place 1 patch onto the skin daily. Remove & Discard patch within 12 hours or as directed by MD 30 patch 1   prenatal vitamin w/FE, FA (PRENATAL 1 + 1) 27-1 MG TABS tablet Take 1 tablet by mouth daily at 12 noon. 30 tablet 12   senna-docusate (SENOKOT-S) 8.6-50 MG tablet Take 2 tablets by mouth daily. 60 tablet 1   No current facility-administered medications on file prior to visit.   Allergies  Allergen Reactions   Pork-Derived Products    Penicillins Rash    Has patient had a PCN reaction causing immediate rash, facial/tongue/throat swelling, SOB or lightheadedness with hypotension: Yes Has patient had a PCN reaction causing severe rash involving mucus membranes or skin necrosis: No Has patient had a PCN reaction that required hospitalization No Has patient had a PCN reaction occurring within the last 10 years: No If all of the above answers are NO, then may proceed with Cephalosporin use. Childhood allergy     Family History  Problem Relation  Age of Onset   Hypertension Mother    Diabetes Father    Kidney disease Father    Diabetes Sister    Diabetes Brother    Breast cancer Neg Hx    PE: BP 110/70   Pulse 69   Ht 5' 5 (1.651 m)   Wt 167 lb 9.6 oz (76 kg)   SpO2 98%   BMI 27.89 kg/m  Wt Readings from Last 3 Encounters:  06/22/23 167 lb 9.6 oz (76 kg)  02/16/23 170 lb 6.4 oz (77.3 kg)  11/04/22 166 lb 11.2 oz (75.6 kg)   Constitutional: Normal weight, in NAD Eyes: EOMI, no exophthalmos ENT: no thyromegaly, no cervical lymphadenopathy Cardiovascular: RRR, No MRG Respiratory: CTA B Musculoskeletal: no deformities Skin: no rashes Neurological: no tremor with outstretched hands  ASSESSMENT: 1. DM1, uncontrolled, without long term complications, but with hypo-/hyperglycemia  We confirmed DM1: Component     Latest Ref Rng 02/09/2015  C-Peptide     0.80 - 3.90 ng/mL 0.24 (L)  Glucose, Fasting     65 - 99 mg/dL 64 (L)  Glutamic Acid Decarb Ab     <5 IU/mL 42 (H)  Pancreatic Islet Cell Antibody     < 5 JDF Units <5   2. HL  PLAN:  1. Patient with uncontrolled type 1 diabetes, on basal-bolus insulin  regimen, with stable control at last visit.  HbA1c at that time was 7.0%.  Sugars appears to be fluctuating within the target range, with  many low blood sugars at all times of the day and night and more fluctuating values after lunch and dinner.  Also, overnight, she sometimes had high blood sugars up to 250s or higher and also many low blood sugars in the second half of the night, even 150s.  Upon questioning, patient mentions that she was increasing her basal insulin  dose (Basaglar ) based on the peaks and valleys in her blood sugars.  We discussed that this was not appropriate, she needed to correct this with Humalog , rather than Basaglar .  I did advise her how to evaluate the need to change her Basaglar  dose, depending on trending blood sugars and if she was not eating or doing strenuous exercise.  At last visit, I  advised her to decrease the Basaglar  dose and split it in 2 doses.  We also discussed about lowering her breakfast and lunch Humalog  but occasionally using higher doses for larger meals. CGM interpretation: -At today's visit, we reviewed her CGM downloads: It appears that 52% of values are in target range (goal >70%), while 33% are higher than 180 (goal <25%), and 15% are lower than 70 (goal <4%).  The calculated average blood sugar is 156.  The projected HbA1c for the next 3 months (GMI) is 7.0%. -Reviewing the CGM trends, sugars appear to be very uncontrolled, increasing significantly after breakfast, but fluctuating during the day with many low blood sugars and significant spikes.  Sugars during the night are higher in the last 2 weeks, but also more fluctuating.  Upon questioning, she is taking slightly higher doses of Humalog  than recommended as she did notice that the sugars are spiking up after her meals.  For example today, after her tea + toast, sugar spiked to >200s but then dropped and she was in the 50s at the time of the visit.  We gave her juice.  Sugars improved.  We discussed about adjusting diet, by including healthy fat, proteins, and fiber with every meal.  Given examples.  I advised her to start with these and and with carbs.  If the sugars do not improve, rather than increasing the dose of Humalog , she will need to inject sooner, maybe 15 to 20 minutes before meals.  She is aware, however, that if the sugars are low before the meals, she will need to move the injection closer to the meal.  We discussed that we first need to get rid of the lows to be able to eliminate the highs, so for now I recommended to decrease her Humalog  doses.  She is not taking Basaglar  split in 2 doses, which we will continue. -At today's visit we also discussed about the possibility of starting on an insulin  pump.  I recommended the OmniPod 5 pump.  I advised her to look it up and also see if her insurance would  cover it.  She has Medicaid, so I do not think this would be a problem.  She would be open to this idea. - I suggested to:  Patient Instructions  Please use the following regimen: - Basaglar  6-7 units in am and 3-4 units at night  Change: - Humalog : 3-4 (5) units 15-20 min before meals  Try to include healthy fats and protein with all meals - start with these and end with carbs.   Please return in 3 months.  -- we checked her HbA1c: 7.4% (higher) - advised to check sugars at different times of the day - 4x a day, rotating check times - advised  for yearly eye exams >> she is UTD - will check an ACR today; otherwise, will check annual labs at next visit - return to clinic in 3-4 months   2. HL - Her lipid fractions were at goal: Lab Results  Component Value Date   CHOL 178 10/14/2022   HDL 60.80 10/14/2022   LDLCALC 99 10/14/2022   TRIG 90.0 10/14/2022   CHOLHDL 3 10/14/2022  -She is not on a statin  Emilie Harden, MD PhD [facility] Endocrinology

## 2023-06-23 ENCOUNTER — Ambulatory Visit: Payer: Self-pay | Admitting: Internal Medicine

## 2023-06-23 LAB — MICROALBUMIN / CREATININE URINE RATIO
Creatinine, Urine: 126 mg/dL (ref 20–275)
Microalb Creat Ratio: 3 mg/g{creat} (ref <30)
Microalb, Ur: 0.4 mg/dL

## 2023-08-02 ENCOUNTER — Other Ambulatory Visit: Payer: Self-pay | Admitting: Internal Medicine

## 2023-08-02 DIAGNOSIS — E1065 Type 1 diabetes mellitus with hyperglycemia: Secondary | ICD-10-CM

## 2023-08-03 NOTE — Telephone Encounter (Signed)
 Refill request complete

## 2023-10-12 ENCOUNTER — Other Ambulatory Visit: Payer: Self-pay | Admitting: Internal Medicine

## 2023-10-12 DIAGNOSIS — E1065 Type 1 diabetes mellitus with hyperglycemia: Secondary | ICD-10-CM

## 2023-11-02 ENCOUNTER — Telehealth: Payer: Self-pay | Admitting: Internal Medicine

## 2023-11-02 NOTE — Telephone Encounter (Signed)
 Patient is calling to say that she is having a problem with insurance covering her Continuous Glucose Sensor (FREESTYLE LIBRE 3 PLUS SENSOR) MISC  and her insulin  lispro (HUMALOG  KWIKPEN) 100 UNIT/ML KwikPen .  It was unclear if she needed a prior authorization.  Patient says that she did not understand what the pharmacy was telling her about her prescriptions, but she is out of both prescriptions.

## 2023-11-04 ENCOUNTER — Telehealth: Payer: Self-pay

## 2023-11-04 ENCOUNTER — Other Ambulatory Visit (HOSPITAL_COMMUNITY): Payer: Self-pay

## 2023-11-04 NOTE — Telephone Encounter (Signed)
 Pharmacy Patient Advocate Encounter   Received notification from Pt Calls Messages that prior authorization for Freestyle libre 3 plus sensor is required/requested.   Insurance verification completed.   Unable to verify. Previous insurance coverage has been terminated.   Message sent requesting updated insurance info

## 2023-11-12 ENCOUNTER — Encounter: Payer: Self-pay | Admitting: Internal Medicine

## 2023-11-12 DIAGNOSIS — E1065 Type 1 diabetes mellitus with hyperglycemia: Secondary | ICD-10-CM

## 2023-11-13 MED ORDER — FREESTYLE LIBRE 3 PLUS SENSOR MISC: 1.0000 | Status: AC

## 2023-11-13 MED ORDER — FIASP FLEXTOUCH 100 UNIT/ML ~~LOC~~ SOPN: 3.0000 [IU] | PEN_INJECTOR | Freq: Three times a day (TID) | SUBCUTANEOUS | Status: DC

## 2023-11-13 MED ORDER — TRESIBA FLEXTOUCH 100 UNIT/ML ~~LOC~~ SOPN: 10.0000 [IU] | PEN_INJECTOR | Freq: Every day | SUBCUTANEOUS | Status: DC

## 2023-11-13 NOTE — Telephone Encounter (Signed)
 Spoke to pt regarding medication sample and instruction.

## 2023-11-17 NOTE — Telephone Encounter (Signed)
 Patient picked up patient assistance - Log Noted Sensor,Fiasp  and Tresiba on 11/17/23

## 2023-11-18 NOTE — Telephone Encounter (Signed)
 Sample were entered on 11/13/23.

## 2024-01-15 ENCOUNTER — Encounter: Payer: Self-pay | Admitting: Internal Medicine

## 2024-01-15 MED ORDER — ACCU-CHEK GUIDE TEST VI STRP: ORAL_STRIP | 12 refills | Status: AC

## 2024-01-15 MED ORDER — ACCU-CHEK SOFTCLIX LANCETS MISC: 12 refills | Status: AC

## 2024-01-15 MED ORDER — ACCU-CHEK GUIDE W/DEVICE KIT: PACK | 0 refills | Status: AC

## 2024-02-08 ENCOUNTER — Encounter: Payer: Self-pay | Admitting: Internal Medicine

## 2024-02-08 ENCOUNTER — Other Ambulatory Visit: Payer: Self-pay | Admitting: Internal Medicine

## 2024-02-08 DIAGNOSIS — E1065 Type 1 diabetes mellitus with hyperglycemia: Secondary | ICD-10-CM

## 2024-02-09 ENCOUNTER — Other Ambulatory Visit: Payer: Self-pay | Admitting: Internal Medicine

## 2024-02-09 DIAGNOSIS — E1065 Type 1 diabetes mellitus with hyperglycemia: Secondary | ICD-10-CM

## 2024-02-09 MED ORDER — TRESIBA FLEXTOUCH 100 UNIT/ML ~~LOC~~ SOPN: 10.0000 [IU] | PEN_INJECTOR | Freq: Every day | SUBCUTANEOUS | 0 refills | Status: AC

## 2024-02-09 MED ORDER — NOVOLOG FLEXPEN 100 UNIT/ML ~~LOC~~ SOPN: PEN_INJECTOR | SUBCUTANEOUS | 0 refills | Status: AC
